# Patient Record
Sex: Female | Born: 1945 | Race: White | Hispanic: No | Marital: Married | Smoking: Never smoker
Health system: Southern US, Community
[De-identification: ages and names within clinical notes are randomized; demographics above are authoritative.]

## PROBLEM LIST (undated history)

## (undated) DIAGNOSIS — F329 Major depressive disorder, single episode, unspecified: Secondary | ICD-10-CM

## (undated) DIAGNOSIS — G459 Transient cerebral ischemic attack, unspecified: Secondary | ICD-10-CM

## (undated) DIAGNOSIS — G479 Sleep disorder, unspecified: Secondary | ICD-10-CM

## (undated) DIAGNOSIS — I639 Cerebral infarction, unspecified: Secondary | ICD-10-CM

## (undated) DIAGNOSIS — N200 Calculus of kidney: Secondary | ICD-10-CM

## (undated) DIAGNOSIS — E785 Hyperlipidemia, unspecified: Secondary | ICD-10-CM

## (undated) DIAGNOSIS — R413 Other amnesia: Secondary | ICD-10-CM

## (undated) DIAGNOSIS — R519 Headache, unspecified: Secondary | ICD-10-CM

## (undated) DIAGNOSIS — I10 Essential (primary) hypertension: Secondary | ICD-10-CM

## (undated) DIAGNOSIS — Z87442 Personal history of urinary calculi: Secondary | ICD-10-CM

## (undated) DIAGNOSIS — F32A Depression, unspecified: Secondary | ICD-10-CM

## (undated) DIAGNOSIS — M1990 Unspecified osteoarthritis, unspecified site: Secondary | ICD-10-CM

## (undated) DIAGNOSIS — F419 Anxiety disorder, unspecified: Secondary | ICD-10-CM

## (undated) HISTORY — PX: BREAST BIOPSY: SHX20

## (undated) HISTORY — PX: OTHER SURGICAL HISTORY: SHX169

## (undated) HISTORY — PX: PARTIAL HYSTERECTOMY: SHX80

## (undated) HISTORY — PX: DILATION AND CURETTAGE OF UTERUS: SHX78

## (undated) HISTORY — PX: ABDOMINAL HYSTERECTOMY: SHX81

## (undated) HISTORY — DX: Hyperlipidemia, unspecified: E78.5

## (undated) HISTORY — DX: Essential (primary) hypertension: I10

## (undated) HISTORY — DX: Major depressive disorder, single episode, unspecified: F32.9

## (undated) HISTORY — PX: TONSILLECTOMY: SUR1361

## (undated) HISTORY — DX: Depression, unspecified: F32.A

## (undated) HISTORY — DX: Anxiety disorder, unspecified: F41.9

---

## 1994-04-18 HISTORY — PX: PARTIAL COLECTOMY: SHX5273

## 2004-04-18 LAB — HM PAP SMEAR

## 2005-02-14 ENCOUNTER — Encounter: Payer: Self-pay | Admitting: Family Medicine

## 2006-04-13 LAB — CONVERTED CEMR LAB
Hgb A1c MFr Bld: 5.4 %
Microalb, Ur: NEGATIVE mg/dL

## 2006-06-27 ENCOUNTER — Emergency Department: Payer: Self-pay | Admitting: Emergency Medicine

## 2006-08-01 LAB — CONVERTED CEMR LAB
AST: 22 units/L
HDL: 58 mg/dL
LDL Cholesterol: 140 mg/dL

## 2006-12-21 ENCOUNTER — Ambulatory Visit: Payer: Self-pay | Admitting: Family Medicine

## 2006-12-21 DIAGNOSIS — E119 Type 2 diabetes mellitus without complications: Secondary | ICD-10-CM

## 2006-12-21 DIAGNOSIS — E785 Hyperlipidemia, unspecified: Secondary | ICD-10-CM

## 2006-12-21 DIAGNOSIS — F321 Major depressive disorder, single episode, moderate: Secondary | ICD-10-CM

## 2006-12-21 DIAGNOSIS — I10 Essential (primary) hypertension: Secondary | ICD-10-CM

## 2006-12-21 DIAGNOSIS — F411 Generalized anxiety disorder: Secondary | ICD-10-CM

## 2006-12-21 DIAGNOSIS — G43009 Migraine without aura, not intractable, without status migrainosus: Secondary | ICD-10-CM | POA: Insufficient documentation

## 2006-12-21 DIAGNOSIS — K279 Peptic ulcer, site unspecified, unspecified as acute or chronic, without hemorrhage or perforation: Secondary | ICD-10-CM | POA: Insufficient documentation

## 2006-12-29 ENCOUNTER — Ambulatory Visit: Payer: Self-pay | Admitting: Family Medicine

## 2007-01-01 LAB — CONVERTED CEMR LAB
AST: 22 units/L (ref 0–37)
Albumin: 4.3 g/dL (ref 3.5–5.2)
Bilirubin, Direct: 0.1 mg/dL (ref 0.0–0.3)
Calcium: 9.4 mg/dL (ref 8.4–10.5)
Chloride: 107 meq/L (ref 96–112)
Cholesterol: 186 mg/dL (ref 0–200)
GFR calc Af Amer: 131 mL/min
GFR calc non Af Amer: 108 mL/min
Glucose, Bld: 103 mg/dL — ABNORMAL HIGH (ref 70–99)
Hgb A1c MFr Bld: 5.7 % (ref 4.6–6.0)
LDL Cholesterol: 111 mg/dL — ABNORMAL HIGH (ref 0–99)
Total Bilirubin: 1 mg/dL (ref 0.3–1.2)
Total CHOL/HDL Ratio: 3.2
Total Protein: 6.5 g/dL (ref 6.0–8.3)
VLDL: 17 mg/dL (ref 0–40)

## 2007-01-22 ENCOUNTER — Telehealth (INDEPENDENT_AMBULATORY_CARE_PROVIDER_SITE_OTHER): Payer: Self-pay | Admitting: *Deleted

## 2007-02-28 ENCOUNTER — Telehealth: Payer: Self-pay | Admitting: Family Medicine

## 2007-03-02 ENCOUNTER — Ambulatory Visit: Payer: Self-pay | Admitting: Family Medicine

## 2007-03-30 ENCOUNTER — Encounter: Payer: Self-pay | Admitting: Family Medicine

## 2007-04-03 ENCOUNTER — Ambulatory Visit: Payer: Self-pay | Admitting: Family Medicine

## 2007-04-04 LAB — CONVERTED CEMR LAB
ALT: 23 units/L (ref 0–35)
Cholesterol: 174 mg/dL (ref 0–200)
HDL: 54 mg/dL (ref >39.0)
Triglycerides: 79 mg/dL (ref 0–149)
VLDL: 16 mg/dL (ref 0–40)

## 2007-04-17 ENCOUNTER — Encounter (INDEPENDENT_AMBULATORY_CARE_PROVIDER_SITE_OTHER): Payer: Self-pay | Admitting: *Deleted

## 2007-07-10 ENCOUNTER — Ambulatory Visit: Payer: Self-pay | Admitting: Family Medicine

## 2007-07-12 ENCOUNTER — Ambulatory Visit: Payer: Self-pay | Admitting: Family Medicine

## 2007-07-12 LAB — CONVERTED CEMR LAB
ALT: 27 units/L (ref 0–35)
BUN: 22 mg/dL (ref 6–23)
Bilirubin, Direct: 0.1 mg/dL (ref 0.0–0.3)
Calcium: 9.7 mg/dL (ref 8.4–10.5)
Chloride: 103 meq/L (ref 96–112)
Cholesterol: 172 mg/dL (ref 0–200)
Creatinine,U: 62.1 mg/dL
GFR calc Af Amer: 109 mL/min
HDL: 46.8 mg/dL (ref >39.0)
Hgb A1c MFr Bld: 5.8 % (ref 4.6–6.0)
Microalb Creat Ratio: 6.4 mg/g (ref 0.0–30.0)
Triglycerides: 95 mg/dL (ref 0–149)

## 2007-10-09 ENCOUNTER — Ambulatory Visit: Payer: Self-pay | Admitting: Family Medicine

## 2007-10-16 ENCOUNTER — Ambulatory Visit: Payer: Self-pay | Admitting: Family Medicine

## 2007-10-16 LAB — CONVERTED CEMR LAB: Hgb A1c MFr Bld: 5.7 % (ref 4.6–6.0)

## 2008-01-10 ENCOUNTER — Ambulatory Visit: Payer: Self-pay | Admitting: Family Medicine

## 2008-01-11 LAB — CONVERTED CEMR LAB
ALT: 23 units/L (ref 0–35)
AST: 22 units/L (ref 0–37)
Albumin: 4.5 g/dL (ref 3.5–5.2)
Chloride: 104 meq/L (ref 96–112)
Cholesterol: 183 mg/dL (ref 0–200)
GFR calc Af Amer: 130 mL/min
Glucose, Bld: 106 mg/dL — ABNORMAL HIGH (ref 70–99)
Hgb A1c MFr Bld: 5.7 % (ref 4.6–6.0)
LDL Cholesterol: 109 mg/dL — ABNORMAL HIGH (ref 0–99)
Potassium: 4.3 meq/L (ref 3.5–5.1)
Sodium: 141 meq/L (ref 135–145)
Total Protein: 6.8 g/dL (ref 6.0–8.3)

## 2008-01-14 ENCOUNTER — Ambulatory Visit: Payer: Self-pay | Admitting: Family Medicine

## 2008-01-14 DIAGNOSIS — M19049 Primary osteoarthritis, unspecified hand: Secondary | ICD-10-CM | POA: Insufficient documentation

## 2008-01-14 LAB — CONVERTED CEMR LAB: Cholesterol, target level: 200 mg/dL

## 2008-02-05 ENCOUNTER — Emergency Department (HOSPITAL_COMMUNITY): Admission: EM | Admit: 2008-02-05 | Discharge: 2008-02-05 | Payer: Self-pay | Admitting: Emergency Medicine

## 2008-02-14 ENCOUNTER — Ambulatory Visit: Payer: Self-pay | Admitting: Family Medicine

## 2008-02-28 ENCOUNTER — Telehealth: Payer: Self-pay | Admitting: Family Medicine

## 2008-04-02 ENCOUNTER — Encounter: Payer: Self-pay | Admitting: Family Medicine

## 2008-04-14 ENCOUNTER — Ambulatory Visit: Payer: Self-pay | Admitting: Family Medicine

## 2008-04-15 ENCOUNTER — Ambulatory Visit: Payer: Self-pay | Admitting: Family Medicine

## 2008-04-15 LAB — CONVERTED CEMR LAB
Albumin: 4.1 g/dL (ref 3.5–5.2)
Alkaline Phosphatase: 75 units/L (ref 39–117)
Bilirubin, Direct: 0.1 mg/dL (ref 0.0–0.3)
Creatinine, Ser: 0.6 mg/dL (ref 0.4–1.2)
GFR calc non Af Amer: 108 mL/min
Glucose, Bld: 111 mg/dL — ABNORMAL HIGH (ref 70–99)
HDL: 57.5 mg/dL (ref >39.0)
Hgb A1c MFr Bld: 5.6 % (ref 4.6–6.0)
Potassium: 4.4 meq/L (ref 3.5–5.1)
Total CHOL/HDL Ratio: 2.9

## 2008-08-12 ENCOUNTER — Telehealth: Payer: Self-pay | Admitting: Family Medicine

## 2008-08-14 ENCOUNTER — Telehealth: Payer: Self-pay | Admitting: Family Medicine

## 2008-10-01 ENCOUNTER — Ambulatory Visit: Payer: Self-pay | Admitting: Family Medicine

## 2008-10-01 LAB — CONVERTED CEMR LAB
AST: 20 units/L (ref 0–37)
Albumin: 4.3 g/dL (ref 3.5–5.2)
Alkaline Phosphatase: 87 units/L (ref 39–117)
BUN: 18 mg/dL (ref 6–23)
CO2: 29 meq/L (ref 19–32)
Chloride: 101 meq/L (ref 96–112)
Cholesterol: 170 mg/dL (ref 0–200)
GFR calc non Af Amer: 107.23 mL/min (ref >60)
Glucose, Bld: 103 mg/dL — ABNORMAL HIGH (ref 70–99)
HDL: 55.4 mg/dL (ref >39.00)
Hgb A1c MFr Bld: 5.6 % (ref 4.6–6.5)
LDL Cholesterol: 99 mg/dL (ref 0–99)
Potassium: 4.3 meq/L (ref 3.5–5.1)
Sodium: 143 meq/L (ref 135–145)
VLDL: 15.8 mg/dL (ref 0.0–40.0)

## 2008-10-03 ENCOUNTER — Ambulatory Visit: Payer: Self-pay | Admitting: Family Medicine

## 2008-10-16 ENCOUNTER — Telehealth: Payer: Self-pay | Admitting: Family Medicine

## 2009-02-10 ENCOUNTER — Telehealth: Payer: Self-pay | Admitting: Family Medicine

## 2009-02-26 ENCOUNTER — Ambulatory Visit: Payer: Self-pay | Admitting: Family Medicine

## 2009-04-01 ENCOUNTER — Ambulatory Visit: Payer: Self-pay | Admitting: Family Medicine

## 2009-04-02 LAB — CONVERTED CEMR LAB
ALT: 19 units/L (ref 0–35)
Albumin: 4.3 g/dL (ref 3.5–5.2)
Alkaline Phosphatase: 77 units/L (ref 39–117)
BUN: 16 mg/dL (ref 6–23)
CO2: 29 meq/L (ref 19–32)
Calcium: 9.5 mg/dL (ref 8.4–10.5)
Cholesterol: 157 mg/dL (ref 0–200)
Creatinine, Ser: 0.6 mg/dL (ref 0.4–1.2)
GFR calc non Af Amer: 107.05 mL/min (ref >60)
LDL Cholesterol: 89 mg/dL (ref 0–99)
Microalb, Ur: 0.3 mg/dL (ref 0.0–1.9)
Potassium: 4 meq/L (ref 3.5–5.1)
Sodium: 139 meq/L (ref 135–145)
Total Bilirubin: 0.9 mg/dL (ref 0.3–1.2)

## 2009-04-22 ENCOUNTER — Ambulatory Visit: Payer: Self-pay | Admitting: Family Medicine

## 2009-05-01 ENCOUNTER — Ambulatory Visit: Payer: Self-pay | Admitting: Family Medicine

## 2009-05-04 LAB — CONVERTED CEMR LAB
Creatinine, Ser: 0.64 mg/dL (ref 0.40–1.20)
Glucose, Bld: 100 mg/dL — ABNORMAL HIGH (ref 70–99)

## 2009-05-29 ENCOUNTER — Ambulatory Visit: Payer: Self-pay | Admitting: Family Medicine

## 2009-08-24 ENCOUNTER — Telehealth: Payer: Self-pay | Admitting: Family Medicine

## 2009-08-29 ENCOUNTER — Encounter: Payer: Self-pay | Admitting: Family Medicine

## 2009-08-29 LAB — HM DIABETES EYE EXAM: HM Diabetic Eye Exam: NORMAL

## 2009-09-01 ENCOUNTER — Encounter: Payer: Self-pay | Admitting: Family Medicine

## 2009-09-08 ENCOUNTER — Encounter: Payer: Self-pay | Admitting: Family Medicine

## 2009-09-23 ENCOUNTER — Ambulatory Visit: Payer: Self-pay | Admitting: Family Medicine

## 2009-09-25 ENCOUNTER — Encounter: Payer: Self-pay | Admitting: Family Medicine

## 2009-09-25 LAB — CONVERTED CEMR LAB
Albumin: 4.4 g/dL (ref 3.5–5.2)
Alkaline Phosphatase: 78 units/L (ref 39–117)
Bilirubin, Direct: 0.1 mg/dL (ref 0.0–0.3)
Cholesterol: 157 mg/dL (ref 0–200)
GFR calc non Af Amer: 102.92 mL/min (ref >60)
Hgb A1c MFr Bld: 5.7 % (ref 4.6–6.5)
LDL Cholesterol: 88 mg/dL (ref 0–99)
Microalb Creat Ratio: 0.9 mg/g (ref 0.0–30.0)
Microalb, Ur: 0.6 mg/dL (ref 0.0–1.9)
Sodium: 141 meq/L (ref 135–145)
Total CHOL/HDL Ratio: 3
Total Protein: 6.5 g/dL (ref 6.0–8.3)
VLDL: 19.4 mg/dL (ref 0.0–40.0)

## 2009-10-02 ENCOUNTER — Ambulatory Visit: Payer: Self-pay | Admitting: Family Medicine

## 2010-01-21 ENCOUNTER — Ambulatory Visit: Payer: Self-pay | Admitting: Family Medicine

## 2010-03-06 LAB — HM MAMMOGRAPHY: HM Mammogram: ABNORMAL

## 2010-03-15 ENCOUNTER — Encounter: Payer: Self-pay | Admitting: Family Medicine

## 2010-03-24 ENCOUNTER — Ambulatory Visit: Payer: Self-pay | Admitting: Family Medicine

## 2010-03-25 LAB — CONVERTED CEMR LAB
ALT: 18 units/L (ref 0–35)
AST: 20 units/L (ref 0–37)

## 2010-04-02 ENCOUNTER — Ambulatory Visit: Payer: Self-pay | Admitting: Family Medicine

## 2010-04-02 LAB — HM DIABETES FOOT EXAM

## 2010-05-04 ENCOUNTER — Telehealth: Payer: Self-pay | Admitting: Family Medicine

## 2010-05-04 ENCOUNTER — Ambulatory Visit
Admission: RE | Admit: 2010-05-04 | Discharge: 2010-05-04 | Payer: Self-pay | Source: Home / Self Care | Attending: Family Medicine | Admitting: Family Medicine

## 2010-05-18 NOTE — Assessment & Plan Note (Signed)
Summary: 1 M F/U MOOD/DLO   Vital Signs:  Patient profile:   65 year old female Height:      61.5 inches Weight:      187.2 pounds BMI:     34.92 Temp:     98.9 degrees F oral Pulse rate:   80 / minute Pulse rhythm:   regular BP sitting:   110 / 70  (left arm) Cuff size:   regular  Vitals Entered By: Benny Lennert CMA Duncan Dull) (May 29, 2009 8:46 AM)  History of Present Illness: HTN, improved on higher dose of lisinopril. Cr and k stable.  Depression, improved control on higher dose on sertraline. No known SE.   Acute Visit History:      The patient complains of cough, earache, fever, headache, nasal discharge, and sore throat.  These symptoms began 2 days ago.  She denies abdominal pain, diarrhea, and sinus problems.  Other comments include: body aches, fatigue daughter with the flu, grandchildren.        The character of the cough is described as nonproductive.  There is no history of wheezing, sleep interference, or shortness of breath associated with her cough.        Earache symptom: left, intermittant sharp.        Problems Prior to Update: 1)  Osteoarthrosis Unspec Whether Gen/localized Hand  (ONG-295.28) 2)  Unspecified Breast Screening  (ICD-V76.10) 3)  Common Migraine  (ICD-346.10) 4)  Shoulder Pain, Left  (ICD-719.41) 5)  Anxiety  (ICD-300.00) 6)  Depression  (ICD-311) 7)  Pud  (ICD-533.90) 8)  Hypertension  (ICD-401.9) 9)  Hyperlipidemia  (ICD-272.4) 10)  Diabetes Mellitus, Type II  (ICD-250.00)  Allergies (verified): No Known Drug Allergies  Past History:  Past medical, surgical, family and social histories (including risk factors) reviewed, and no changes noted (except as noted below).  Past Medical History: Reviewed history from 12/21/2006 and no changes required. Diabetes mellitus, type II Hyperlipidemia Hypertension Depression Anxiety  Past Surgical History: Reviewed history from 12/21/2006 and no changes required. 1996 partial colectomy  for ruptured abcess for diverticulitis right parotid gland removed Hysterectomy, partial for uterine prolapse breast biopsy  Family History: Reviewed history from 12/21/2006 and no changes required. mother Alzheimer's father no contact 1 brother no contact MGF: DM, CAD  Social History: Reviewed history from 12/21/2006 and no changes required. 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 CV:  Denies chest pain or discomfort.  Physical Exam  General:  fatigued appearing  in NAD  Head:  no maxillar sinus ttp Ears:  clear lfuid B TMs Nose:  nbasal discharge Mouth:  MMM Neck:  no carotid bruit or thyromegaly no cervical or supraclavicular lymphadenopathy  Lungs:  Normal respiratory effort, chest expands symmetrically. Lungs are clear to auscultation, no crackles or wheezes. Heart:  Normal rate and regular rhythm. S1 and S2 normal without gallop, murmur, click, rub or other extra sounds. Pulses:  R and L posterior tibial pulses are full and equal bilaterally  Extremities:  no edema Skin:  Intact without suspicious lesions or rashes   Impression & Recommendations:  Problem # 1:  DEPRESSION (ICD-311) Improved on current increase in med.  Her updated medication list for this problem includes:    Sertraline Hcl 100 Mg Tabs (Sertraline hcl) .Marland Kitchen... Take 1 tablet by mouth once a day  Problem # 2:  HYPERTENSION (ICD-401.9) improved and no SE on higher  dose lisinopril.  Her updated medication list for this problem includes:    Lisinopril 5 Mg Tabs (Lisinopril) .Marland Kitchen... Take 1 tablet by mouth once a day    Hydrochlorothiazide 25 Mg Tabs (Hydrochlorothiazide) .Marland Kitchen... 1 by mouth once daily  Problem # 3:  INFLUENZA (ICD-487.8) Discussed symptomatic care.  Hydration, rest. Call if SOB, cough worsening or prolongued fever. Reviewed flu timeline. Shefor use of tamiflu,  given age and med history. She was advised to not return to work until 24 hour after fever resolved on no antipyretics.    Complete Medication List: 1)  Vytorin 10-80 Mg Tabs (Ezetimibe-simvastatin) .Marland Kitchen.. 1 tab by mouth daily 2)  Lisinopril 5 Mg Tabs (Lisinopril) .... Take 1 tablet by mouth once a day 3)  Nexium 40 Mg Cpdr (Esomeprazole magnesium) .Marland Kitchen.. 1 by mouth once daily 4)  Sertraline Hcl 100 Mg Tabs (Sertraline hcl) .... Take 1 tablet by mouth once a day 5)  Hydrochlorothiazide 25 Mg Tabs (Hydrochlorothiazide) .Marland Kitchen.. 1 by mouth once daily 6)  Centrum Silver Tabs (Multiple vitamins-minerals) .Marland Kitchen.. 1 by mouth once daily 7)  Citracal + D 250-200 Mg-unit Tabs (Calcium citrate-vitamin d) .Marland Kitchen.. 1 by mouth once daily 8)  Bayer Low Strength 81 Mg Tbec (Aspirin) .Marland Kitchen.. 1 by mouth once daily 9)  Fish Oil Oil (Fish oil) .... Take 1 capsule by mouth once a day 10)  Bayer Breeze 2 Glucose Monitor Disc  .... Check blood sugar 1-2 x daily dx 250.00 11)  Meloxicam 7.5 Mg Tabs (Meloxicam) .Marland Kitchen.. 1-2 tab by mouth daily as needed pain 12)  Glucosamine-chondroitin 500-400 Mg Caps (Glucosamine-chondroitin) .... Take 1 tablet by mouth three times a day 13)  Tamiflu 75 Mg Caps (Oseltamivir phosphate) .Marland Kitchen.. 1 tab by mouth two times a day x 5 day 14)  Cheratussin Ac 100-10 Mg/51ml Syrp (Guaifenesin-codeine) .Marland Kitchen.. 1-2 tsp by mouth at bedtime as needed cough Prescriptions: CHERATUSSIN AC 100-10 MG/5ML SYRP (GUAIFENESIN-CODEINE) 1-2 tsp by mouth at bedtime as needed cough  #8 oz x 0   Entered and Authorized by:   Kerby Nora MD   Signed by:   Kerby Nora MD on 05/29/2009   Method used:   Print then Give to Patient   RxID:   [id number] TAMIFLU 75 MG CAPS (OSELTAMIVIR PHOSPHATE) 1 tab by mouth two times a day x 5 day  #10 x 0   Entered and Authorized by:   Kerby Nora MD   Signed by:   Kerby Nora MD on 05/29/2009   Method used:   Electronically to        [facility] (retail)       [address]       [place], [place]  [address]       Ph: [phone] or [phone]       Fax: [phone]   RxID:   [id number]   Current Allergies (reviewed today): No known allergies

## 2010-05-18 NOTE — Assessment & Plan Note (Signed)
Summary: CPX/DLO   Vital Signs:  Patient profile:   65 year old female Height:      61.5 inches Weight:      183.0 pounds BMI:     34.14 Temp:     98.2 degrees F oral Pulse rate:   80 / minute Pulse rhythm:   regular BP sitting:   120 / 70  (left arm) Cuff size:   regular  Vitals Entered By: Benny Lennert CMA Duncan Dull) (October 02, 2009 11:10 AM)  History of Present Illness: Chief complaint cpx  Mammogram, abnormal .. repeated diagnostic/US remains abnormal...recommend repeat in 6 months.   Depression, moderate control: On sertraline 100 mg daily. Above breast abnormality is worrying her some. Poor sleep at night..wakes up early in AMs, no SI.    Problems Prior to Update: 1)  Influenza  (ICD-487.8) 2)  Osteoarthrosis Unspec Whether Gen/localized Hand  (ICD-715.94) 3)  Unspecified Breast Screening  (ICD-V76.10) 4)  Common Migraine  (ICD-346.10) 5)  Shoulder Pain, Left  (ICD-719.41) 6)  Anxiety  (ICD-300.00) 7)  Depression  (ICD-311) 8)  Pud  (ICD-533.90) 9)  Hypertension  (ICD-401.9) 10)  Hyperlipidemia  (ICD-272.4) 11)  Diabetes Mellitus, Type II  (ICD-250.00)  Current Medications (verified): 1)  Vytorin 10-80 Mg Tabs (Ezetimibe-Simvastatin) .Marland Kitchen.. 1 Tab By Mouth Daily 2)  Lisinopril 5 Mg Tabs (Lisinopril) .... Take 1 Tablet By Mouth Once A Day 3)  Nexium 40 Mg  Cpdr (Esomeprazole Magnesium) .Marland Kitchen.. 1 By Mouth Once Daily 4)  Sertraline Hcl 100 Mg Tabs (Sertraline Hcl) .... Take 1 Tablet By Mouth Once A Day 5)  Hydrochlorothiazide 25 Mg  Tabs (Hydrochlorothiazide) .Marland Kitchen.. 1 By Mouth Once Daily 6)  Centrum Silver   Tabs (Multiple Vitamins-Minerals) .Marland Kitchen.. 1 By Mouth Once Daily 7)  Citracal + D 250-200 Mg-Unit  Tabs (Calcium Citrate-Vitamin D) .Marland Kitchen.. 1 By Mouth Once Daily 8)  Bayer Low Strength 81 Mg  Tbec (Aspirin) .Marland Kitchen.. 1 By Mouth Once Daily 9)  Fish Oil   Oil (Fish Oil) .... Take 1 Capsule By Mouth Once A Day 10)  Bayer Breeze 2 Glucose Monitor Disc .... Check Blood Sugar 1-2 X Daily  Dx 250.00 11)  Meloxicam 7.5 Mg Tabs (Meloxicam) .Marland Kitchen.. 1-2 Tab By Mouth Daily As Needed Pain 12)  Glucosamine-Chondroitin 500-400 Mg Caps (Glucosamine-Chondroitin) .... Take 1 Tablet By Mouth Three Times A Day  Allergies (verified): No Known Drug Allergies  Past History:  Past medical, surgical, family and social histories (including risk factors) reviewed, and no changes noted (except as noted below).  Past Medical History: Reviewed history from 12/21/2006 and no changes required. Diabetes mellitus, type II Hyperlipidemia Hypertension Depression Anxiety  Past Surgical History: Reviewed history from 12/21/2006 and no changes required. 1996 partial colectomy for ruptured abcess for diverticulitis right parotid gland removed Hysterectomy, partial for uterine prolapse breast biopsy  Family History: Reviewed history from 12/21/2006 and no changes required. mother Alzheimer's father no contact 1 brother no contact MGF: DM, CAD  Social History: Reviewed history from 12/21/2006 and no changes required. 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 CV:  Denies chest pain or discomfort. Resp:  Denies shortness of breath. GI:  Denies abdominal pain, bloody stools, constipation, and diarrhea. GU:  Denies dysuria. Derm:  Denies rash.  Physical Exam  General:  fatigued appearing  in NAD  Eyes:  No corneal or conjunctival inflammation noted. EOMI. Perrla. Funduscopic  exam benign, without hemorrhages, exudates or papilledema. Vision grossly normal. Ears:  External ear exam shows no significant lesions or deformities.  Otoscopic examination reveals clear canals, tympanic membranes are intact bilaterally without bulging, retraction, inflammation or discharge. Hearing is grossly normal bilaterally. Nose:  External nasal examination shows no deformity or  inflammation. Nasal mucosa are pink and moist without lesions or exudates. Mouth:  Oral mucosa and oropharynx without lesions or exudates.  Teeth in good repair. Neck:  no carotid bruit or thyromegaly no cervical or supraclavicular lymphadenopathy  Chest Wall:  No deformities, masses, or tenderness noted. Breasts:  No mass, nodules, thickening, tenderness, bulging, retraction, inflamation, nipple discharge or skin changes noted.   Lungs:  Normal respiratory effort, chest expands symmetrically. Lungs are clear to auscultation, no crackles or wheezes. Heart:  Normal rate and regular rhythm. S1 and S2 normal without gallop, murmur, click, rub or other extra sounds. Abdomen:  Bowel sounds positive,abdomen soft and non-tender without masses, organomegaly or hernias noted. Genitalia:  not indicated Msk:  No deformity or scoliosis noted of thoracic or lumbar spine.   Pulses:  R and L posterior tibial pulses are full and equal bilaterally  Extremities:  no edema  Neurologic:  No cranial nerve deficits noted. Station and gait are normal. Plantar reflexes are down-going bilaterally. DTRs are symmetrical throughout. Sensory, motor and coordinative functions appear intact. Skin:  Intact without suspicious lesions or rashes Psych:  Cognition and judgment appear intact. Alert and cooperative with normal attention span and concentration. No apparent delusions, illusions, hallucinations   Impression & Recommendations:  Problem # 1:  Preventive Health Care (ICD-V70.0) The patient's preventative maintenance and recommended screening tests for an annual wellness exam were reviewed in full today. Brought up to date unless services declined.  Counselled on the importance of diet, exercise, and its role in overall health and mortality. The patient's FH and SH was reviewed, including their home life, tobacco status, and drug and alcohol status.     Problem # 2:  DEPRESSION (ICD-311) Moderately controlled.  Continue current medication.  Her updated medication list for this problem includes:    Sertraline Hcl 100 Mg Tabs (Sertraline hcl) .Marland Kitchen... Take 1 tablet by mouth once a day  Problem # 3:  HYPERTENSION (ICD-401.9)  Well controlled. Continue current medication.  Her updated medication list for this problem includes:    Lisinopril 5 Mg Tabs (Lisinopril) .Marland Kitchen... Take 1 tablet by mouth once a day    Hydrochlorothiazide 25 Mg Tabs (Hydrochlorothiazide) .Marland Kitchen... 1 by mouth once daily  BP today: 120/70 Prior BP: 110/70 (05/29/2009)  Prior 10 Yr Risk Heart Disease: 20 % (04/22/2009)  Labs Reviewed: K+: 4.5 (09/23/2009) Creat: : 0.6 (09/23/2009)   Chol: 157 (09/23/2009)   HDL: 50.10 (09/23/2009)   LDL: 88 (09/23/2009)   TG: 97.0 (09/23/2009)  Problem # 4:  HYPERLIPIDEMIA (ICD-272.4)  Very stable control...space checking out to every 12 months. AST, ALT in 6 months.  Her updated medication list for this problem includes:    Vytorin 10-80 Mg Tabs (Ezetimibe-simvastatin) .Marland Kitchen... 1 tab by mouth daily  Labs Reviewed: SGOT: 24 (09/23/2009)   SGPT: 23 (09/23/2009)  Lipid Goals: Chol Goal: 200 (01/14/2008)   HDL Goal: 40 (01/14/2008)   LDL Goal: 100 (01/14/2008)   TG Goal: 150 (01/14/2008)  Prior 10 Yr Risk Heart Disease: 20 % (04/22/2009)   HDL:50.10 (09/23/2009), 49.60 (04/01/2009)  LDL:88 (09/23/2009), 89 (04/01/2009)  Chol:157 (09/23/2009), 157 (04/01/2009)  Trig:97.0 (09/23/2009), 93.0 (04/01/2009)  Problem # 5:  DIABETES  MELLITUS, TYPE II (ICD-250.00)  Well controlled with lifestyle. Encouraged exercise, weight loss, healthy eating habits.  Rechek A1C in 6 months.  Her updated medication list for this problem includes:    Lisinopril 5 Mg Tabs (Lisinopril) .Marland Kitchen... Take 1 tablet by mouth once a day    Bayer Low Strength 81 Mg Tbec (Aspirin) .Marland Kitchen... 1 by mouth once daily  Labs Reviewed: Creat: 0.6 (09/23/2009)     Last Eye Exam: normal (08/29/2009) Reviewed HgBA1c results: 5.7 (09/23/2009)  5.7  (04/01/2009)  Complete Medication List: 1)  Vytorin 10-80 Mg Tabs (Ezetimibe-simvastatin) .Marland Kitchen.. 1 tab by mouth daily 2)  Lisinopril 5 Mg Tabs (Lisinopril) .... Take 1 tablet by mouth once a day 3)  Nexium 40 Mg Cpdr (Esomeprazole magnesium) .Marland Kitchen.. 1 by mouth once daily 4)  Sertraline Hcl 100 Mg Tabs (Sertraline hcl) .... Take 1 tablet by mouth once a day 5)  Hydrochlorothiazide 25 Mg Tabs (Hydrochlorothiazide) .Marland Kitchen.. 1 by mouth once daily 6)  Centrum Silver Tabs (Multiple vitamins-minerals) .Marland Kitchen.. 1 by mouth once daily 7)  Citracal + D 250-200 Mg-unit Tabs (Calcium citrate-vitamin d) .Marland Kitchen.. 1 by mouth once daily 8)  Bayer Low Strength 81 Mg Tbec (Aspirin) .Marland Kitchen.. 1 by mouth once daily 9)  Fish Oil Oil (Fish oil) .... Take 1 capsule by mouth once a day 10)  Bayer Breeze 2 Glucose Monitor Disc  .... Check blood sugar 1-2 x daily dx 250.00 11)  Meloxicam 7.5 Mg Tabs (Meloxicam) .Marland Kitchen.. 1-2 tab by mouth daily as needed pain 12)  Glucosamine-chondroitin 500-400 Mg Caps (Glucosamine-chondroitin) .... Take 1 tablet by mouth three times a day  Patient Instructions: 1)  Please schedule a follow-up appointment in 6 months  DM, HTN 2)  HgBA1c prior to visit  ICD-9: 250.00    Current Allergies (reviewed today): No known allergies    Herpes Zoster Next Due:  Refused Flex Sig Next Due:  Not Indicated Colonoscopy Result Date:  04/19/1991 Colonoscopy Next Due:  Refused Hemoccult Next Due:  Refused Last PAP:  Normal (07/17/2005 8:08:07 AM) PAP Next Due:  3 yr Last Mammogram:  normal (03/30/2007 10:57:53 PM) Mammogram Result Date:  09/08/2009 Mammogram Result:  abnormal, left  Mammogram Next Due:  6 mo Waiting on colonoscopy until after gets medicare.

## 2010-05-18 NOTE — Letter (Signed)
Summary: [facility]   Imported By: Lester Spry 10/03/2009 10:16:55  _____________________________________________________________________  External Attachment:    Type:   Image     Comment:   External Document

## 2010-05-18 NOTE — Letter (Signed)
Summary: [facility]   Imported By: Lanelle Bal 09/03/2009 11:51:42  _____________________________________________________________________  External Attachment:    Type:   Image     Comment:   External Document  Appended Document: Orders Update    Clinical Lists Changes  Observations: Added new observation of EYES COMMENT: 08/2010 (09/04/2009 17:54) Added new observation of DMEYEEXMRES: normal (08/29/2009 17:54) Added new observation of DIAB EYE EX: normal (08/29/2009 17:54)       Diabetes Management History:      She is checking home blood sugars.  She says that she is exercising.  Type of exercise includes: walking.  Duration of exercise is estimated to be 30 min.  She is doing this 4 times per week.    Diabetes Management Exam:    Eye Exam:       Eye Exam done elsewhere          Date: 08/29/2009          Results: normal          Done by: eye MD  Diabetes Management Assessment/Plan:      The following lipid goals have been established for the patient: Total cholesterol goal of 200; LDL cholesterol goal of 100; HDL cholesterol goal of 40; Triglyceride goal of 150.  Her blood pressure goal is < 130/80.

## 2010-05-18 NOTE — Assessment & Plan Note (Signed)
Summary: FLU SHOT/CLE  Nurse Visit   Allergies: No Known Drug Allergies  Orders Added: 1)  Admin 1st Vaccine [90471] 2)  Flu Vaccine 3yrs + [90658]  Flu Vaccine Consent Questions     Do you have a history of severe allergic reactions to this vaccine? no    Any prior history of allergic reactions to egg and/or gelatin? no    Do you have a sensitivity to the preservative Thimersol? no    Do you have a past history of Guillan-Barre Syndrome? no    Do you currently have an acute febrile illness? no    Have you ever had a severe reaction to latex? no    Vaccine information given and explained to patient? yes    Are you currently pregnant? no    Lot Number:[id number]   Exp Date:10/16/2010   Site Given  Left Deltoid IMu  

## 2010-05-18 NOTE — Assessment & Plan Note (Signed)
Summary: 6 month follow up/hmw   Vital Signs:  Patient profile:   65 year old female Height:      61.5 inches Weight:      188.6 pounds BMI:     35.19 Temp:     97.8 degrees F oral Pulse rate:   80 / minute Pulse rhythm:   regular BP sitting:   130 / 90  (left arm) Cuff size:   regular  Vitals Entered By: Benny Lennert CMA Duncan Dull) (April 22, 2009 9:58 AM)  History of Present Illness: Chief complaint 6 month follow up  Depression, moderate control...recent stressors of family med illnesses.  Some insomnia.  No SI.  She states she has "no time" for a counselor.   HTN, inadequate control...BPs trending up at home 140/93 ...awaking with AM headaches. No snoring at night.  Not interested in sleep study at this time.   Hypertension History:      She denies chest pain, orthopnea, peripheral edema, and syncope.  She notes no problems with any antihypertensive medication side effects.        Positive major cardiovascular risk factors include female age 55 years old or older, diabetes, hyperlipidemia, and hypertension.  Negative major cardiovascular risk factors include non-tobacco-user status.    Lipid Management History:      Positive NCEP/ATP III risk factors include female age 32 years old or older, diabetes, and hypertension.  Negative NCEP/ATP III risk factors include non-tobacco-user status.        The patient states that she knows about the "Therapeutic Lifestyle Change" diet.  Her compliance with the TLC diet is excellent.  Adjunctive measures started by the patient include aerobic exercise, fiber, limit alcohol consumpton, and weight reduction.  She expresses no side effects from her lipid-lowering medication.  The patient denies any symptoms to suggest myopathy or liver disease.  Comments: Not exercsiing because too busy.    Problems Prior to Update: 1)  Osteoarthrosis Unspec Whether Gen/localized Hand  (EAV-409.81) 2)  Unspecified Breast Screening  (ICD-V76.10) 3)  Common  Migraine  (ICD-346.10) 4)  Shoulder Pain, Left  (ICD-719.41) 5)  Anxiety  (ICD-300.00) 6)  Depression  (ICD-311) 7)  Pud  (ICD-533.90) 8)  Hypertension  (ICD-401.9) 9)  Hyperlipidemia  (ICD-272.4) 10)  Diabetes Mellitus, Type II  (ICD-250.00)  Current Medications (verified): 1)  Vytorin 10-80 Mg Tabs (Ezetimibe-Simvastatin) .Marland Kitchen.. 1 Tab By Mouth Daily 2)  Lisinopril 2.5 Mg  Tabs (Lisinopril) .Marland Kitchen.. 1 By Mouth Once Daily 3)  Nexium 40 Mg  Cpdr (Esomeprazole Magnesium) .Marland Kitchen.. 1 By Mouth Once Daily 4)  Sertraline Hcl 100 Mg Tabs (Sertraline Hcl) .... Take 1 Tablet By Mouth Once A Day 5)  Hydrochlorothiazide 25 Mg  Tabs (Hydrochlorothiazide) .Marland Kitchen.. 1 By Mouth Once Daily 6)  Centrum Silver   Tabs (Multiple Vitamins-Minerals) .Marland Kitchen.. 1 By Mouth Once Daily 7)  Citracal + D 250-200 Mg-Unit  Tabs (Calcium Citrate-Vitamin D) .Marland Kitchen.. 1 By Mouth Once Daily 8)  Bayer Low Strength 81 Mg  Tbec (Aspirin) .Marland Kitchen.. 1 By Mouth Once Daily 9)  Fish Oil   Oil (Fish Oil) .... Take 1 Capsule By Mouth Once A Day 10)  Bayer Breeze 2 Glucose Monitor Disc .... Check Blood Sugar 1-2 X Daily Dx 250.00 11)  Meloxicam 7.5 Mg Tabs (Meloxicam) .Marland Kitchen.. 1-2 Tab By Mouth Daily As Needed Pain 12)  Glucosamine-Chondroitin 500-400 Mg Caps (Glucosamine-Chondroitin) .... Take 1 Tablet By Mouth Three Times A Day  Allergies (verified): No Known Drug Allergies  Past History:  Past medical, surgical, family and social histories (including risk factors) reviewed, and no changes noted (except as noted below).  Past Medical History: Reviewed history from 12/21/2006 and no changes required. Diabetes mellitus, type II Hyperlipidemia Hypertension Depression Anxiety  Past Surgical History: Reviewed history from 12/21/2006 and no changes required. 1996 partial colectomy for ruptured abcess for diverticulitis right parotid gland removed Hysterectomy, partial for uterine prolapse breast biopsy  Family History: Reviewed history from 12/21/2006 and  no changes required. mother Alzheimer's father no contact 1 brother no contact MGF: DM, CAD  Social History: Reviewed history from 12/21/2006 and no changes required. Married Occupation: Daycare Never Smoked Alcohol use-no Drug use-no Regular exercise-no Diet: poor diet, fast food  Physical Exam  General:  obese appearing female in NAd Mouth:  MMM Neck:  no carotid bruit or thyromegaly  Lungs:  Normal respiratory effort, chest expands symmetrically. Lungs are clear to auscultation, no crackles or wheezes. Heart:  Normal rate and regular rhythm. S1 and S2 normal without gallop, murmur, click, rub or other extra sounds. Abdomen:  Bowel sounds positive,abdomen soft and non-tender without masses, organomegaly or hernias noted. Pulses:  R and L posterior tibial pulses are full and equal bilaterally  Extremities:  no edema Psych:  Oriented X3, subdued, withdrawn, and poor eye contact.    Diabetes Management Exam:    Foot Exam (with socks and/or shoes not present):       Sensory-Pinprick/Light touch:          Left medial foot (L-4): normal          Left dorsal foot (L-5): normal          Left lateral foot (S-1): normal          Right medial foot (L-4): normal          Right dorsal foot (L-5): normal          Right lateral foot (S-1): normal       Sensory-Monofilament:          Left foot: normal          Right foot: normal       Inspection:          Left foot: normal          Right foot: normal       Nails:          Left foot: normal          Right foot: normal   Impression & Recommendations:  Problem # 1:  DEPRESSION (ICD-311) poor control...increase sertraline to 100 mg daily.  Encouraged counseling.Margaretmary Lombard not interested.  Her updated medication list for this problem includes:    Sertraline Hcl 100 Mg Tabs (Sertraline hcl) .Marland Kitchen... Take 1 tablet by mouth once a day  Problem # 2:  HYPERTENSION (ICD-401.9)  Inadequate control. Concern for sleep apnea, but pt refuses  sleep study at this time. Will increase lisinopril and check Cr in 7-10 days.  Her updated medication list for this problem includes:    Lisinopril 5 Mg Tabs (Lisinopril) .Marland Kitchen... Take 1 tablet by mouth once a day    Hydrochlorothiazide 25 Mg Tabs (Hydrochlorothiazide) .Marland Kitchen... 1 by mouth once daily  BP today: 130/90 Prior BP: 132/84 (10/03/2008)  10 Yr Risk Heart Disease: 20 % Prior 10 Yr Risk Heart Disease: 13 % (10/03/2008)  Labs Reviewed: K+: 4.0 (04/01/2009) Creat: : 0.6 (04/01/2009)   Chol: 157 (04/01/2009)   HDL: 49.60 (04/01/2009)   LDL: 89 (04/01/2009)  TG: 93.0 (04/01/2009)  Problem # 3:  HYPERLIPIDEMIA (ICD-272.4)  Well controlle don current meds.  Her updated medication list for this problem includes:    Vytorin 10-80 Mg Tabs (Ezetimibe-simvastatin) .Marland Kitchen... 1 tab by mouth daily  Labs Reviewed: SGOT: 21 (04/01/2009)   SGPT: 19 (04/01/2009)  Lipid Goals: Chol Goal: 200 (01/14/2008)   HDL Goal: 40 (01/14/2008)   LDL Goal: 100 (01/14/2008)   TG Goal: 150 (01/14/2008)  10 Yr Risk Heart Disease: 20 % Prior 10 Yr Risk Heart Disease: 13 % (10/03/2008)   HDL:49.60 (04/01/2009), 55.40 (10/01/2008)  LDL:89 (04/01/2009), 99 (10/01/2008)  Chol:157 (04/01/2009), 170 (10/01/2008)  Trig:93.0 (04/01/2009), 79.0 (10/01/2008)  Problem # 4:  DIABETES MELLITUS, TYPE II (ICD-250.00)  Well controlled with diet and lifestyle.  Her updated medication list for this problem includes:    Lisinopril 5 Mg Tabs (Lisinopril) .Marland Kitchen... Take 1 tablet by mouth once a day    Bayer Low Strength 81 Mg Tbec (Aspirin) .Marland Kitchen... 1 by mouth once daily  Labs Reviewed: Creat: 0.6 (04/01/2009)     Last Eye Exam: normal (03/18/2008) Reviewed HgBA1c results: 5.7 (04/01/2009)  5.6 (10/01/2008)  Complete Medication List: 1)  Vytorin 10-80 Mg Tabs (Ezetimibe-simvastatin) .Marland Kitchen.. 1 tab by mouth daily 2)  Lisinopril 5 Mg Tabs (Lisinopril) .... Take 1 tablet by mouth once a day 3)  Nexium 40 Mg Cpdr (Esomeprazole magnesium)  .Marland Kitchen.. 1 by mouth once daily 4)  Sertraline Hcl 100 Mg Tabs (Sertraline hcl) .... Take 1 tablet by mouth once a day 5)  Hydrochlorothiazide 25 Mg Tabs (Hydrochlorothiazide) .Marland Kitchen.. 1 by mouth once daily 6)  Centrum Silver Tabs (Multiple vitamins-minerals) .Marland Kitchen.. 1 by mouth once daily 7)  Citracal + D 250-200 Mg-unit Tabs (Calcium citrate-vitamin d) .Marland Kitchen.. 1 by mouth once daily 8)  Bayer Low Strength 81 Mg Tbec (Aspirin) .Marland Kitchen.. 1 by mouth once daily 9)  Fish Oil Oil (Fish oil) .... Take 1 capsule by mouth once a day 10)  Bayer Breeze 2 Glucose Monitor Disc  .... Check blood sugar 1-2 x daily dx 250.00 11)  Meloxicam 7.5 Mg Tabs (Meloxicam) .Marland Kitchen.. 1-2 tab by mouth daily as needed pain 12)  Glucosamine-chondroitin 500-400 Mg Caps (Glucosamine-chondroitin) .... Take 1 tablet by mouth three times a day  Hypertension Assessment/Plan:      The patient's hypertensive risk group is category C: Target organ damage and/or diabetes.  Her calculated 10 year risk of coronary heart disease is 20 %.  Today's blood pressure is 130/90.  Her blood pressure goal is < 130/80.  Lipid Assessment/Plan:      Based on NCEP/ATP III, the patient's risk factor category is "history of diabetes".  The patient's lipid goals are as follows: Total cholesterol goal is 200; LDL cholesterol goal is 100; HDL cholesterol goal is 40; Triglyceride goal is 150.  Her LDL cholesterol goal has been met.    Patient Instructions: 1)  Follow up in 1 month mood.  2)  BMET in 7- 10 days after increasing lisinopril. 3)  CPE in 6 months with fasting lipids, A1C, microalbumin, CMET prior Dx 250.00 Prescriptions: LISINOPRIL 5 MG TABS (LISINOPRIL) Take 1 tablet by mouth once a day  #30 x 11   Entered and Authorized by:   Kerby Nora MD   Signed by:   Kerby Nora MD on 04/22/2009   Method used:   Electronically to        [facility]  [address] [phone]* (retail)       [address]  [place], [place]  [address]       Ph: [phone] or [phone]        Fax: [phone]   RxID:   [id number] SERTRALINE HCL 100 MG TABS (SERTRALINE HCL) Take 1 tablet by mouth once a day  #30 x 3   Entered and Authorized by:   Kerby Nora MD   Signed by:   Kerby Nora MD on 04/22/2009   Method used:   [facility]        [facility]  [address] (retail)       [address]       [place], [place]  [address]       Ph: [phone] or [phone]       Fax: [phone]   RxID:   [id number]   Current Allergies (reviewed today): No known allergies

## 2010-05-18 NOTE — Progress Notes (Signed)
Summary: needs order for mammogram  Phone Note Call from Patient Call back at Home Phone [phone]   Caller: Patient Call For: Kerby Nora MD Summary of Call: Pt needs order for mammogram.  She goes to [facility].  She has appt set up for 5/17. Initial call taken by: Lowella Petties CMA,  Aug 24, 2009 2:00 PM

## 2010-05-20 NOTE — Assessment & Plan Note (Signed)
Summary: 1 MONTH FOLLOW UP MOOD/RBH   Vital Signs:  Patient profile:   65 year old female Height:      61.5 inches Weight:      188.75 pounds BMI:     35.21 Temp:     98.1 degrees F oral Pulse rate:   80 / minute Pulse rhythm:   regular BP sitting:   120 / 76  (left arm) Cuff size:   regular  Vitals Entered By: Benny Lennert CMA Duncan Dull) (May 04, 2010 8:34 AM)  History of Present Illness: Chief complaint 1 month follow up mood   Depression.. 1 month ago.. changed from sertraline to wellbutrin 150 mg.  She states that she is minimally calmer day to day.. still  tearful and very agitated and mean in stress ful situations.  Difficulty sleeping at night.Marland Kitchen 1-2 hours at night.   No SI.  I past celexa stopped working, Careers information officer not effective and expensive for her.  Sertraline helped initially in 2009.   Not interested in seeing counselor.  Problems Prior to Update: 1)  Preventive Health Care  (ICD-V70.0) 2)  Osteoarthrosis Unspec Whether Gen/localized Hand  (ICD-715.94) 3)  Unspecified Breast Screening  (ICD-V76.10) 4)  Common Migraine  (ICD-346.10) 5)  Anxiety  (ICD-300.00) 6)  Depression  (ICD-311) 7)  Pud  (ICD-533.90) 8)  Hypertension  (ICD-401.9) 9)  Hyperlipidemia  (ICD-272.4) 10)  Diabetes Mellitus, Type II  (ICD-250.00)  Current Medications (verified): 1)  Vytorin 10-80 Mg Tabs (Ezetimibe-Simvastatin) .Marland Kitchen.. 1 Tab By Mouth Daily 2)  Lisinopril 5 Mg Tabs (Lisinopril) .... Take 1 Tablet By Mouth Once A Day 3)  Nexium 40 Mg  Cpdr (Esomeprazole Magnesium) .Marland Kitchen.. 1 By Mouth Once Daily 4)  Bupropion Hcl 150 Mg Xr24h-Tab (Bupropion Hcl) .Marland Kitchen.. 1 Tab By Mouth Daily 5)  Hydrochlorothiazide 25 Mg  Tabs (Hydrochlorothiazide) .Marland Kitchen.. 1 By Mouth Once Daily 6)  Centrum Silver   Tabs (Multiple Vitamins-Minerals) .Marland Kitchen.. 1 By Mouth Once Daily 7)  Citracal + D 250-200 Mg-Unit  Tabs (Calcium Citrate-Vitamin D) .Marland Kitchen.. 1 By Mouth Once Daily 8)  Bayer Low Strength 81 Mg  Tbec (Aspirin) .Marland Kitchen.. 1 By  Mouth Once Daily 9)  Fish Oil   Oil (Fish Oil) .... Take 1 Capsule By Mouth Once A Day 10)  Bayer Breeze 2 Glucose Monitor Disc .... Check Blood Sugar 1-2 X Daily Dx 250.00 11)  Meloxicam 7.5 Mg Tabs (Meloxicam) .Marland Kitchen.. 1-2 Tab By Mouth Daily As Needed Pain 12)  Glucosamine-Chondroitin 500-400 Mg Caps (Glucosamine-Chondroitin) .... Take 1 Tablet By Mouth Three Times A Day  Allergies (verified): No Known Drug Allergies  Past History:  Past medical, surgical, family and social histories (including risk factors) reviewed, and no changes noted (except as noted below).  Past Medical History: Reviewed history from 12/21/2006 and no changes required. Diabetes mellitus, type II Hyperlipidemia Hypertension Depression Anxiety  Past Surgical History: Reviewed history from 12/21/2006 and no changes required. 1996 partial colectomy for ruptured abcess for diverticulitis right parotid gland removed Hysterectomy, partial for uterine prolapse breast biopsy  Family History: Reviewed history from 12/21/2006 and no changes required. mother Alzheimer's father no contact 1 brother no contact MGF: DM, CAD  Social History: Reviewed history from 12/21/2006 and no changes required. 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 CV:  Denies chest pain or discomfort. Resp:  Denies shortness of breath.  Physical Exam  General:  Well-developed,well-nourished,in no  acute distress; alert,appropriate and cooperative throughout examination Mouth:  MMM Lungs:  Normal respiratory effort, chest expands symmetrically. Lungs are clear to auscultation, no crackles or wheezes. Heart:  Normal rate and regular rhythm. S1 and S2 normal without gallop, murmur, click, rub or other extra sounds. Psych:  Cognition and judgment appear intact. Alert and cooperative with normal attention span and  concentration. No apparent delusions, illusions, hallucinations   Impression & Recommendations:  Problem # 1:  DEPRESSION (ICD-311) Wellbutrin activating in her.. causing insomnia..not effective for mood. Given SSRI helped in past.. wi;ll try lexapro if not to expensive for her. Follow up in 1 month.  The following medications were removed from the medication list:    Bupropion Hcl 150 Mg Xr24h-tab (Bupropion hcl) .Marland Kitchen... 1 tab by mouth daily Her updated medication list for this problem includes:    Lexapro 10 Mg Tabs (Escitalopram oxalate) .Marland Kitchen... Take 1 tablet by mouth once a day  Complete Medication List: 1)  Vytorin 10-80 Mg Tabs (Ezetimibe-simvastatin) .Marland Kitchen.. 1 tab by mouth daily 2)  Lisinopril 5 Mg Tabs (Lisinopril) .... Take 1 tablet by mouth once a day 3)  Nexium 40 Mg Cpdr (Esomeprazole magnesium) .Marland Kitchen.. 1 by mouth once daily 4)  Hydrochlorothiazide 25 Mg Tabs (Hydrochlorothiazide) .Marland Kitchen.. 1 by mouth once daily 5)  Centrum Silver Tabs (Multiple vitamins-minerals) .Marland Kitchen.. 1 by mouth once daily 6)  Citracal + D 250-200 Mg-unit Tabs (Calcium citrate-vitamin d) .Marland Kitchen.. 1 by mouth once daily 7)  Bayer Low Strength 81 Mg Tbec (Aspirin) .Marland Kitchen.. 1 by mouth once daily 8)  Fish Oil Oil (Fish oil) .... Take 1 capsule by mouth once a day 9)  Bayer Breeze 2 Glucose Monitor Disc  .... Check blood sugar 1-2 x daily dx 250.00 10)  Meloxicam 7.5 Mg Tabs (Meloxicam) .Marland Kitchen.. 1-2 tab by mouth daily as needed pain 11)  Glucosamine-chondroitin 500-400 Mg Caps (Glucosamine-chondroitin) .... Take 1 tablet by mouth three times a day 12)  Lexapro 10 Mg Tabs (Escitalopram oxalate) .... Take 1 tablet by mouth once a day  Patient Instructions: 1)  Start lexapro. 2)  Please schedule a follow-up appointment in 1 month depression. Prescriptions: LEXAPRO 10 MG TABS (ESCITALOPRAM OXALATE) Take 1 tablet by mouth once a day  #30 x 3   Entered and Authorized by:   Kerby Nora MD   Signed by:   Kerby Nora MD on 05/04/2010   Method  used:   [facility]        [facility]  [address] [id number]       [address]       [place], [place]  [address]       Ph: [phone] or [phone]       Fax: [phone]   RxID:   [id number]    Orders Added: 1)  Est. Patient Level III [36644]    Current Allergies (reviewed today): No known allergies

## 2010-05-20 NOTE — Assessment & Plan Note (Signed)
Summary: 6 M F/U DM HTN/DLO R/S FROM 04/01/10   Vital Signs:  Patient profile:   65 year old female Height:      61.5 inches Weight:      186.12 pounds BMI:     34.72 Temp:     98.4 degrees F oral Pulse rate:   80 / minute Pulse rhythm:   regular BP sitting:   120 / 80  (left arm) Cuff size:   regular  Vitals Entered By: Benny Lennert CMA Duncan Dull) (April 02, 2010 8:37 AM)  History of Present Illness: Chief complaint 6 month follow up   DM, very well controlled. Cheking blood sugar once a day.  High cholesterol... well controlled last check... LFTS nml, NO SE on vytorin.  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 occ left.  Using meloxicam.. helps spome but not as much as it used to. Also on glucosamine.   Depression.Marland Kitchen deteriorated.. more tearful.. not well controlled on sertraline.   Poor sleep at night... 3-4 hours a night.  Hypertension History:      She denies headache, chest pain, palpitations, dyspnea with exertion, orthopnea, peripheral edema, syncope, and side effects from treatment.        Positive major cardiovascular risk factors include female age 67 years old or older, diabetes, hyperlipidemia, and hypertension.  Negative major cardiovascular risk factors include non-tobacco-user status.     Problems Prior to Update: 1)  Preventive Health Care  (ICD-V70.0) 2)  Osteoarthrosis Unspec Whether Gen/localized Hand  (ICD-715.94) 3)  Unspecified Breast Screening  (ICD-V76.10) 4)  Common Migraine  (ICD-346.10) 5)  Anxiety  (ICD-300.00) 6)  Depression  (ICD-311) 7)  Pud  (ICD-533.90) 8)  Hypertension  (ICD-401.9) 9)  Hyperlipidemia  (ICD-272.4) 10)  Diabetes Mellitus, Type II  (ICD-250.00)  Current Medications (verified): 1)  Vytorin 10-80 Mg Tabs (Ezetimibe-Simvastatin) .Marland Kitchen.. 1 Tab By Mouth Daily 2)  Lisinopril 5 Mg Tabs (Lisinopril) .... Take 1 Tablet By Mouth Once A Day 3)  Nexium 40 Mg  Cpdr (Esomeprazole Magnesium) .Marland Kitchen.. 1 By Mouth Once Daily 4)   Sertraline Hcl 100 Mg Tabs (Sertraline Hcl) .... Take 1 Tablet By Mouth Once A Day 5)  Hydrochlorothiazide 25 Mg  Tabs (Hydrochlorothiazide) .Marland Kitchen.. 1 By Mouth Once Daily 6)  Centrum Silver   Tabs (Multiple Vitamins-Minerals) .Marland Kitchen.. 1 By Mouth Once Daily 7)  Citracal + D 250-200 Mg-Unit  Tabs (Calcium Citrate-Vitamin D) .Marland Kitchen.. 1 By Mouth Once Daily 8)  Bayer Low Strength 81 Mg  Tbec (Aspirin) .Marland Kitchen.. 1 By Mouth Once Daily 9)  Fish Oil   Oil (Fish Oil) .... Take 1 Capsule By Mouth Once A Day 10)  Bayer Breeze 2 Glucose Monitor Disc .... Check Blood Sugar 1-2 X Daily Dx 250.00 11)  Meloxicam 7.5 Mg Tabs (Meloxicam) .Marland Kitchen.. 1-2 Tab By Mouth Daily As Needed Pain 12)  Glucosamine-Chondroitin 500-400 Mg Caps (Glucosamine-Chondroitin) .... Take 1 Tablet By Mouth Three Times A Day  Allergies (verified): No Known Drug Allergies  Past History:  Past medical, surgical, family and social histories (including risk factors) reviewed, and no changes noted (except as noted below).  Past Medical History: Reviewed history from 12/21/2006 and no changes required. Diabetes mellitus, type II Hyperlipidemia Hypertension Depression Anxiety  Past Surgical History: Reviewed history from 12/21/2006 and no changes required. 1996 partial colectomy for ruptured abcess for diverticulitis right parotid gland removed Hysterectomy, partial for uterine prolapse breast biopsy  Family History: Reviewed history from 12/21/2006 and no changes required. mother Alzheimer's father no  contact 1 brother no contact MGF: DM, CAD  Social History: Reviewed history from 12/21/2006 and no changes required. Married Occupation: Daycare Never Smoked Alcohol use-no Drug use-no Regular exercise-no Diet: poor diet, fast food  Review of Systems General:  Denies fatigue. CV:  Denies chest pain or discomfort. Resp:  occ cough with allergies sneezing Using Claritin Using allergy drops in eyes.Marland Kitchen GI:  Denies abdominal pain. GU:   Denies dysuria.  Physical Exam  General:  fatigued appearing  in NAD, overweight  Ears:  External ear exam shows no significant lesions or deformities.  Otoscopic examination reveals clear canals, tympanic membranes are intact bilaterally without bulging, retraction, inflammation or discharge. Hearing is grossly normal bilaterally. Nose:  nasal dischargemucosal pallor.   Mouth:  Oral mucosa and oropharynx without lesions or exudates.  Teeth in good repair. Neck:  no carotid bruit or thyromegaly no cervical or supraclavicular lymphadenopathy  Lungs:  Normal respiratory effort, chest expands symmetrically. Lungs are clear to auscultation, no crackles or wheezes. Heart:  Normal rate and regular rhythm. S1 and S2 normal without gallop, murmur, click, rub or other extra sounds. Abdomen:  Bowel sounds positive,abdomen soft and non-tender without masses, organomegaly or hernias noted. Msk:  Pain in B CMC joint, swelling, no redness...pain with grind test of CMC joints.  Decreased grip strength due to pain  Pulses:  R and L posterior tibial pulses are full and equal bilaterally  Extremities:  no edema   Diabetes Management Exam:    Foot Exam (with socks and/or shoes not present):       Sensory-Pinprick/Light touch:          Left medial foot (L-4): normal          Left dorsal foot (L-5): normal          Left lateral foot (S-1): normal          Right medial foot (L-4): normal          Right dorsal foot (L-5): normal          Right lateral foot (S-1): normal       Sensory-Monofilament:          Left foot: normal          Right foot: normal       Inspection:          Left foot: normal          Right foot: normal       Nails:          Left foot: normal          Right foot: normal   Impression & Recommendations:  Problem # 1:  OSTEOARTHROSIS UNSPEC WHETHER GEN/LOCALIZED HAND (ICD-715.94) CMC joint arthritis Her updated medication list for this problem includes:    Bayer Low Strength 81 Mg  Tbec (Aspirin) .Marland Kitchen... 1 by mouth once daily    Meloxicam 7.5 Mg Tabs (Meloxicam) .Marland Kitchen... 1-2 tab by mouth daily as needed pain  Orders: Orthopedic Referral (Ortho)  Problem # 2:  DEPRESSION (ICD-311) Poor control... cahnge sertraline to wellbutrin.  Her updated medication list for this problem includes:    Bupropion Hcl 150 Mg Xr24h-tab (Bupropion hcl) .Marland Kitchen... 1 tab by mouth daily  Problem # 3:  HYPERTENSION (ICD-401.9)  Well controlled. Continue current medication.  Her updated medication list for this problem includes:    Lisinopril 5 Mg Tabs (Lisinopril) .Marland Kitchen... Take 1 tablet by mouth once a day    Hydrochlorothiazide 25 Mg Tabs (Hydrochlorothiazide) .Marland KitchenMarland KitchenMarland KitchenMarland Kitchen  1 by mouth once daily  BP today: 120/80 Prior BP: 120/70 (10/02/2009)  10 Yr Risk Heart Disease: 13 % Prior 10 Yr Risk Heart Disease: 20 % (04/22/2009)  Labs Reviewed: K+: 4.5 (09/23/2009) Creat: : 0.6 (09/23/2009)   Chol: 157 (09/23/2009)   HDL: 50.10 (09/23/2009)   LDL: 88 (09/23/2009)   TG: 97.0 (09/23/2009)  Problem # 4:  DIABETES MELLITUS, TYPE II (ICD-250.00) Well controlled with diet.  Her updated medication list for this problem includes:    Lisinopril 5 Mg Tabs (Lisinopril) .Marland Kitchen... Take 1 tablet by mouth once a day    Bayer Low Strength 81 Mg Tbec (Aspirin) .Marland Kitchen... 1 by mouth once daily  Problem # 5:  HYPERLIPIDEMIA (ICD-272.4) Well controlled. Continue current medication.  Her updated medication list for this problem includes:    Vytorin 10-80 Mg Tabs (Ezetimibe-simvastatin) .Marland Kitchen... 1 tab by mouth daily  Labs Reviewed: SGOT: 20 (03/24/2010)   SGPT: 18 (03/24/2010)  Lipid Goals: Chol Goal: 200 (01/14/2008)   HDL Goal: 40 (01/14/2008)   LDL Goal: 100 (01/14/2008)   TG Goal: 150 (01/14/2008)  10 Yr Risk Heart Disease: 13 % Prior 10 Yr Risk Heart Disease: 20 % (04/22/2009)   HDL:50.10 (09/23/2009), 49.60 (04/01/2009)  LDL:88 (09/23/2009), 89 (04/01/2009)  Chol:157 (09/23/2009), 157 (04/01/2009)  Trig:97.0 (09/23/2009),  93.0 (04/01/2009)  Complete Medication List: 1)  Vytorin 10-80 Mg Tabs (Ezetimibe-simvastatin) .Marland Kitchen.. 1 tab by mouth daily 2)  Lisinopril 5 Mg Tabs (Lisinopril) .... Take 1 tablet by mouth once a day 3)  Nexium 40 Mg Cpdr (Esomeprazole magnesium) .Marland Kitchen.. 1 by mouth once daily 4)  Bupropion Hcl 150 Mg Xr24h-tab (Bupropion hcl) .Marland Kitchen.. 1 tab by mouth daily 5)  Hydrochlorothiazide 25 Mg Tabs (Hydrochlorothiazide) .Marland Kitchen.. 1 by mouth once daily 6)  Centrum Silver Tabs (Multiple vitamins-minerals) .Marland Kitchen.. 1 by mouth once daily 7)  Citracal + D 250-200 Mg-unit Tabs (Calcium citrate-vitamin d) .Marland Kitchen.. 1 by mouth once daily 8)  Bayer Low Strength 81 Mg Tbec (Aspirin) .Marland Kitchen.. 1 by mouth once daily 9)  Fish Oil Oil (Fish oil) .... Take 1 capsule by mouth once a day 10)  Bayer Breeze 2 Glucose Monitor Disc  .... Check blood sugar 1-2 x daily dx 250.00 11)  Meloxicam 7.5 Mg Tabs (Meloxicam) .Marland Kitchen.. 1-2 tab by mouth daily as needed pain 12)  Glucosamine-chondroitin 500-400 Mg Caps (Glucosamine-chondroitin) .... Take 1 tablet by mouth three times a day  Hypertension Assessment/Plan:      The patient's hypertensive risk group is category C: Target organ damage and/or diabetes.  Her calculated 10 year risk of coronary heart disease is 13 %.  Today's blood pressure is 120/80.  Her blood pressure goal is < 130/80.  Patient Instructions: 1)  Schedule welcome to medicare visit or CPX in 6 months... 45 min appt. 2)  Fasting labs prior... lipids, CMET, A1C, microalbumin Dx 250.00, 272.0 3)  May use claritin or zyrtec for allergies as needed. 4)  Stop sertraline.. change to wellbutrin... take in AMs. 5)  Please schedule a follow-up appointment in 1 month for mood.  6)   Referral Appointment Information 7)  Day/Date: 8)  Time: 9)  Place/MD: 10)  Address: 11)  Phone/Fax: 12)  Patient given appointment information. Information/Orders faxed/mailed.  Prescriptions: BAYER BREEZE 2 GLUCOSE MONITOR DISC Check blood sugar 1-2 x daily Dx  250.00  #1 box x 11   Entered and Authorized by:   Kerby Nora MD   Signed by:   Kerby Nora MD on 04/02/2010  Method used:   Print then Give to Patient   RxID:   [id number] BUPROPION HCL 150 MG XR24H-TAB (BUPROPION HCL) 1 tab by mouth daily  #30 x 5   Entered and Authorized by:   Kerby Nora MD   Signed by:   Kerby Nora MD on 04/02/2010   Method used:   [facility]ron[facility] to        [facility]  [address] (retail)       [address]       [place], [place]  [address]       Ph: [phone] or [phone]       Fax: [phone]   RxID:   [id number]    Orders Added: 1)  Orthopedic Referral [Ortho] 2)  Est. Patient Level IV [[address]]    Current Allergies (reviewed today): No known allergies

## 2010-05-20 NOTE — Progress Notes (Signed)
Summary: lexapro is too costly  Phone Note From Pharmacy   Caller: [facility]  [id number] Summary of Call: Pharmacy is asking to change lexapro to something less costly.  Pt's cost for lexapro is $135.00.                 Lowella Petties CMA, AAMA  May 04, 2010 10:19 AM   Follow-up for Phone Call        Change to fluxoetine  Follow-up by: Kerby Nora MD,  May 04, 2010 10:33 AM    New/Updated Medications: FLUOXETINE HCL 20 MG CAPS (FLUOXETINE HCL) Take 1 tablet by mouth once a day Prescriptions: FLUOXETINE HCL 20 MG CAPS (FLUOXETINE HCL) Take 1 tablet by mouth once a day  #30 x 3   Entered and Authorized by:   Kerby Nora MD   Signed by:   Kerby Nora MD on 05/04/2010   Method used:   [facility]        [facility]  [address] (retail)       [address]       [place], [place]  [address]       Ph: [phone] or [phone]       Fax: [phone]   RxID:   [id number]

## 2010-05-31 ENCOUNTER — Ambulatory Visit (INDEPENDENT_AMBULATORY_CARE_PROVIDER_SITE_OTHER): Payer: BC Managed Care – PPO | Admitting: Family Medicine

## 2010-05-31 ENCOUNTER — Encounter: Payer: Self-pay | Admitting: Family Medicine

## 2010-05-31 DIAGNOSIS — F329 Major depressive disorder, single episode, unspecified: Secondary | ICD-10-CM

## 2010-05-31 DIAGNOSIS — F411 Generalized anxiety disorder: Secondary | ICD-10-CM

## 2010-06-09 NOTE — Assessment & Plan Note (Signed)
Vital Signs:  Patient profile:   65 year old female Weight:      183.50 pounds Temp:     98.2 degrees F oral Pulse rate:   72 / minute Pulse rhythm:   regular BP sitting:   106 / 58  (left arm) Cuff size:   large  Vitals Entered By: Sydell Axon LPN (May 31, 2010 8:31 AM) CC: Follow-up on new medication   History of Present Illness: Depression: seen last 1/17 ... wellbutrin  not working adequately for mood...tried lexapro, but too costly.  She has now been on fluoxetine for mood for 1 month at 20 mg tabs. Per pt she is dping better... less tearful, more optimistic. No anxiety attack.  70% improvement. No SI.  Sleeping 5-6 hours a night. No specific SE.   Stress continues in life... has new great grandson.  Problems Prior to Update: 1)  Preventive Health Care  (ICD-V70.0) 2)  Osteoarthrosis Unspec Whether Gen/localized Hand  (ICD-715.94) 3)  Unspecified Breast Screening  (ICD-V76.10) 4)  Common Migraine  (ICD-346.10) 5)  Anxiety  (ICD-300.00) 6)  Depression  (ICD-311) 7)  Pud  (ICD-533.90) 8)  Hypertension  (ICD-401.9) 9)  Hyperlipidemia  (ICD-272.4) 10)  Diabetes Mellitus, Type II  (ICD-250.00)  Current Medications (verified): 1)  Vytorin 10-80 Mg Tabs (Ezetimibe-Simvastatin) .Marland Kitchen.. 1 Tab By Mouth Daily 2)  Lisinopril 5 Mg Tabs (Lisinopril) .... Take 1 Tablet By Mouth Once A Day 3)  Nexium 40 Mg  Cpdr (Esomeprazole Magnesium) .Marland Kitchen.. 1 By Mouth Once Daily 4)  Hydrochlorothiazide 25 Mg  Tabs (Hydrochlorothiazide) .Marland Kitchen.. 1 By Mouth Once Daily 5)  Centrum Silver   Tabs (Multiple Vitamins-Minerals) .Marland Kitchen.. 1 By Mouth Once Daily 6)  Citracal + D 250-200 Mg-Unit  Tabs (Calcium Citrate-Vitamin D) .Marland Kitchen.. 1 By Mouth Once Daily 7)  Bayer Low Strength 81 Mg  Tbec (Aspirin) .Marland Kitchen.. 1 By Mouth Once Daily 8)  Fish Oil   Oil (Fish Oil) .... Take 1 Capsule By Mouth Once A Day 9)  Bayer Breeze 2 Glucose Monitor Disc .... Check Blood Sugar 1-2 X Daily Dx 250.00 10)  Meloxicam 7.5 Mg Tabs  (Meloxicam) .Marland Kitchen.. 1-2 Tab By Mouth Daily As Needed Pain 11)  Glucosamine-Chondroitin 500-400 Mg Caps (Glucosamine-Chondroitin) .... Take 1 Tablet By Mouth Three Times A Day 12)  Fluoxetine Hcl 20 Mg Caps (Fluoxetine Hcl) .... Take 1 Tablet By Mouth Once A Day  Allergies (verified): No Known Drug Allergies  Past History:  Past medical, surgical, family and social histories (including risk factors) reviewed, and no changes noted (except as noted below).  Past Medical History: Reviewed history from 12/21/2006 and no changes required. Diabetes mellitus, type II Hyperlipidemia Hypertension Depression Anxiety  Past Surgical History: Reviewed history from 12/21/2006 and no changes required. 1996 partial colectomy for ruptured abcess for diverticulitis right parotid gland removed Hysterectomy, partial for uterine prolapse breast biopsy  Family History: Reviewed history from 12/21/2006 and no changes required. mother Alzheimer's father no contact 1 brother no contact MGF: DM, CAD  Social History: Reviewed history from 12/21/2006 and no changes required. 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 CV:  Denies chest pain or discomfort. Resp:  Denies shortness of breath.  Physical Exam  General:  Well-developed,well-nourished,in no acute distress; alert,appropriate and cooperative throughout examination Mouth:  MMM Neck:  no carotid bruit or thyromegaly no cervical or supraclavicular lymphadenopathy  Lungs:  Normal respiratory effort, chest expands symmetrically. Lungs are clear to auscultation, no crackles or wheezes. Heart:  Normal rate and regular rhythm. S1 and S2 normal without gallop, murmur, click, rub or other extra sounds. Pulses:  R and L posterior tibial pulses are full and equal bilaterally    Impression & Recommendations:  Problem #  1:  DEPRESSION (ICD-311) Assessment Improved Well controlled. Continue current medication.  Her updated medication list for this problem includes:    Fluoxetine Hcl 20 Mg Caps (Fluoxetine hcl) .Marland Kitchen... Take 1 tablet by mouth once a day  Problem # 2:  ANXIETY (ICD-300.00) Assessment: Improved  Her updated medication list for this problem includes:    Fluoxetine Hcl 20 Mg Caps (Fluoxetine hcl) .Marland Kitchen... Take 1 tablet by mouth once a day  Complete Medication List: 1)  Vytorin 10-80 Mg Tabs (Ezetimibe-simvastatin) .Marland Kitchen.. 1 tab by mouth daily 2)  Lisinopril 5 Mg Tabs (Lisinopril) .... Take 1 tablet by mouth once a day 3)  Nexium 40 Mg Cpdr (Esomeprazole magnesium) .Marland Kitchen.. 1 by mouth once daily 4)  Hydrochlorothiazide 25 Mg Tabs (Hydrochlorothiazide) .Marland Kitchen.. 1 by mouth once daily 5)  Centrum Silver Tabs (Multiple vitamins-minerals) .Marland Kitchen.. 1 by mouth once daily 6)  Citracal + D 250-200 Mg-unit Tabs (Calcium citrate-vitamin d) .Marland Kitchen.. 1 by mouth once daily 7)  Bayer Low Strength 81 Mg Tbec (Aspirin) .Marland Kitchen.. 1 by mouth once daily 8)  Fish Oil Oil (Fish oil) .... Take 1 capsule by mouth once a day 9)  Bayer Breeze 2 Glucose Monitor Disc  .... Check blood sugar 1-2 x daily dx 250.00 10)  Meloxicam 7.5 Mg Tabs (Meloxicam) .Marland Kitchen.. 1-2 tab by mouth daily as needed pain 11)  Glucosamine-chondroitin 500-400 Mg Caps (Glucosamine-chondroitin) .... Take 1 tablet by mouth three times a day 12)  Fluoxetine Hcl 20 Mg Caps (Fluoxetine hcl) .... Take 1 tablet by mouth once a day  Patient Instructions: 1)  Continue on medictiton at current dose. Call if mood not continuing to improve. 2)   Keep appt in [place].   Orders Added: 1)  Est. Patient Level II [[address]]    Current Allergies (reviewed today): No known allergies

## 2010-06-14 ENCOUNTER — Encounter: Payer: Self-pay | Admitting: Family Medicine

## 2010-06-14 ENCOUNTER — Ambulatory Visit (INDEPENDENT_AMBULATORY_CARE_PROVIDER_SITE_OTHER): Payer: BC Managed Care – PPO | Admitting: Family Medicine

## 2010-06-14 DIAGNOSIS — J209 Acute bronchitis, unspecified: Secondary | ICD-10-CM | POA: Insufficient documentation

## 2010-06-24 ENCOUNTER — Telehealth: Payer: Self-pay | Admitting: Family Medicine

## 2010-06-24 NOTE — Assessment & Plan Note (Signed)
Summary: COUGH,COLD/CLE   [employer]   Vital Signs:  Patient profile:   65 year old female Height:      61.5 inches Weight:      181.50 pounds BMI:     33.86 O2 Sat:      99 % Temp:     97.8 degrees F oral Pulse rate:   88 / minute Pulse rhythm:   regular Resp:     18 per minute BP sitting:   110 / 80  (left arm) Cuff size:   regular  Vitals Entered By: Benny Lennert CMA Duncan Dull) (June 14, 2010 10:45 AM)  History of Present Illness: Chief complaint cough and cold  Acute Visit History:      The patient complains of cough, headache, sinus problems, and sore throat.  These symptoms began 1 week ago.  She denies chest pain, earache, fever, and nausea.  Other comments include: left facial pressure/pain fatigue Getting worse not better. Using robitussin OTC.        The character of the cough is described as productive.  There is no history of wheezing or shortness of breath associated with her cough.        She complains of sinus pressure.        Problems Prior to Update: 1)  Preventive Health Care  (ICD-V70.0) 2)  Osteoarthrosis Unspec Whether Gen/localized Hand  (ICD-715.94) 3)  Unspecified Breast Screening  (ICD-V76.10) 4)  Common Migraine  (ICD-346.10) 5)  Anxiety  (ICD-300.00) 6)  Depression  (ICD-311) 7)  Pud  (ICD-533.90) 8)  Hypertension  (ICD-401.9) 9)  Hyperlipidemia  (ICD-272.4) 10)  Diabetes Mellitus, Type II  (ICD-250.00)  Current Medications (verified): 1)  Vytorin 10-80 Mg Tabs (Ezetimibe-Simvastatin) .Marland Kitchen.. 1 Tab By Mouth Daily 2)  Lisinopril 5 Mg Tabs (Lisinopril) .... Take 1 Tablet By Mouth Once A Day 3)  Nexium 40 Mg  Cpdr (Esomeprazole Magnesium) .Marland Kitchen.. 1 By Mouth Once Daily 4)  Hydrochlorothiazide 25 Mg  Tabs (Hydrochlorothiazide) .Marland Kitchen.. 1 By Mouth Once Daily 5)  Centrum Silver   Tabs (Multiple Vitamins-Minerals) .Marland Kitchen.. 1 By Mouth Once Daily 6)  Citracal + D 250-200 Mg-Unit  Tabs (Calcium Citrate-Vitamin D) .Marland Kitchen.. 1 By Mouth Once Daily 7)  Bayer Low Strength 81 Mg   Tbec (Aspirin) .Marland Kitchen.. 1 By Mouth Once Daily 8)  Fish Oil   Oil (Fish Oil) .... Take 1 Capsule By Mouth Once A Day 9)  Bayer Breeze 2 Glucose Monitor Disc .... Check Blood Sugar 1-2 X Daily Dx 250.00 10)  Meloxicam 7.5 Mg Tabs (Meloxicam) .Marland Kitchen.. 1-2 Tab By Mouth Daily As Needed Pain 11)  Glucosamine-Chondroitin 500-400 Mg Caps (Glucosamine-Chondroitin) .... Take 1 Tablet By Mouth Three Times A Day 12)  Fluoxetine Hcl 20 Mg Caps (Fluoxetine Hcl) .... Take 1 Tablet By Mouth Once A Day  Allergies (verified): No Known Drug Allergies  Past History:  Past medical, surgical, family and social histories (including risk factors) reviewed, and no changes noted (except as noted below).  Past Medical History: Reviewed history from 12/21/2006 and no changes required. Diabetes mellitus, type II Hyperlipidemia Hypertension Depression Anxiety  Past Surgical History: Reviewed history from 12/21/2006 and no changes required. 1996 partial colectomy for ruptured abcess for diverticulitis right parotid gland removed Hysterectomy, partial for uterine prolapse breast biopsy  Family History: Reviewed history from 12/21/2006 and no changes required. mother Alzheimer's father no contact 1 brother no contact MGF: DM, CAD  Social History: Reviewed history from 12/21/2006 and no changes required. 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 ENT:  Denies nosebleeds. CV:  Denies chest pain or discomfort. GI:  Denies abdominal pain. GU:  Denies dysuria.  Physical Exam  General:  overweight female in NAD  Head:  right maxiallry isnus ttp Ears:  External ear exam shows no significant lesions or deformities.  Otoscopic examination reveals clear canals, tympanic membranes are intact bilaterally without bulging, retraction, inflammation or discharge. Hearing is grossly normal  bilaterally. Nose:  clear nasal discharge Mouth:  Oral mucosa and oropharynx without lesions or exudates.  Teeth in good repair. Neck:  no carotid bruit or thyromegaly no cervical or supraclavicular lymphadenopathy  Lungs:  Normal respiratory effort, chest expands symmetrically. Lungs are clear to auscultation, no crackles or wheezes.  Freuent cough Heart:  Normal rate and regular rhythm. S1 and S2 normal without gallop, murmur, click, rub or other extra sounds. Pulses:  R and L posterior tibial pulses are full and equal bilaterally  Extremities:  no edema    Impression & Recommendations:  Problem # 1:  ACUTE BRONCHITIS (ICD-466.0) Worsening 1 week out and possible beginnings of maxillary sinusitis... cover for bacterial inmvolvement with azithromycin. Offered cough supressant.  Symptomatic care.   Her updated medication list for this problem includes:    Azithromycin 250 Mg Tabs (Azithromycin) .Marland Kitchen... 2 tab by mouth x 1 day, then 1 tab by mouth daily  Complete Medication List: 1)  Vytorin 10-80 Mg Tabs (Ezetimibe-simvastatin) .Marland Kitchen.. 1 tab by mouth daily 2)  Lisinopril 5 Mg Tabs (Lisinopril) .... Take 1 tablet by mouth once a day 3)  Nexium 40 Mg Cpdr (Esomeprazole magnesium) .Marland Kitchen.. 1 by mouth once daily 4)  Hydrochlorothiazide 25 Mg Tabs (Hydrochlorothiazide) .Marland Kitchen.. 1 by mouth once daily 5)  Centrum Silver Tabs (Multiple vitamins-minerals) .Marland Kitchen.. 1 by mouth once daily 6)  Citracal + D 250-200 Mg-unit Tabs (Calcium citrate-vitamin d) .Marland Kitchen.. 1 by mouth once daily 7)  Bayer Low Strength 81 Mg Tbec (Aspirin) .Marland Kitchen.. 1 by mouth once daily 8)  Fish Oil Oil (Fish oil) .... Take 1 capsule by mouth once a day 9)  Bayer Breeze 2 Glucose Monitor Disc  .... Check blood sugar 1-2 x daily dx 250.00 10)  Meloxicam 7.5 Mg Tabs (Meloxicam) .Marland Kitchen.. 1-2 tab by mouth daily as needed pain 11)  Glucosamine-chondroitin 500-400 Mg Caps (Glucosamine-chondroitin) .... Take 1 tablet by mouth three times a day 12)  Fluoxetine  Hcl 20 Mg Caps (Fluoxetine hcl) .... Take 1 tablet by mouth once a day 13)  Azithromycin 250 Mg Tabs (Azithromycin) .... 2 tab by mouth x 1 day, then 1 tab by mouth daily  Patient Instructions: 1)  Take your antibiotic as prescribed until ALL of it is gone, but stop if you develop a rash or swelling and contact our office as soon as possible.  2)  Push fluids. 3)   Robitussin or Mucinex for cough.  4)  Call if not imprpving in next 3-4 days... go ER if SOB.  Prescriptions: AZITHROMYCIN 250 MG TABS (AZITHROMYCIN) 2 tab by mouth x 1 day, then 1 tab by mouth daily  #6 x 0   Entered and Authorized by:   Kerby Nora MD   Signed by:   Kerby Nora MD on 06/14/2010   Method used:   [facility] to        [facility] [phone]* (retail)       [address]  [place], [place]  [address]       Ph: [phone] or [phone]       Fax: [phone]   RxID:   [id number]    Orders Added: 1)  Est. Patient Level III [64403]    Current Allergies (reviewed today): No known allergies

## 2010-06-29 NOTE — Progress Notes (Signed)
Summary: pt requests antibiotic  Phone Note Call from Patient Call back at Home Phone [phone]   Caller: Patient Call For: Crystal Nora MD Summary of Call: Pt was seen on 2/27 for bronchitis/ sinus infection and given z-pack.  She took this and felt better but now feels bad again, with cough.  No other symptoms.   She thinks she needs more antibiotic.  [facility]  Please let pt know. Initial call taken by: Lowella Petties CMA, AAMA,  June 24, 2010 9:34 AM  Follow-up for Phone Call        Will broaden antibitoic to fluroquinolone.   If not improving will need follow up appt.  if severe SOb... go to ER.  Follow-up by: Crystal Nora MD,  June 24, 2010 4:19 PM  Additional Follow-up for Phone Call Additional follow up Details #1::        patient advised.Consuello Masse CMA   Additional Follow-up by: Benny Lennert CMA Duncan Dull),  June 24, 2010 4:43 PM    New/Updated Medications: AVELOX 400 MG TABS (MOXIFLOXACIN HCL) 1 tab by mouth daily x 5 days Prescriptions: AVELOX 400 MG TABS (MOXIFLOXACIN HCL) 1 tab by mouth daily x 5 days  #5 x 0   Entered and Authorized by:   Crystal Nora MD   Signed by:   Crystal Nora MD on 06/24/2010   Method used:   [facility]        [facility] (retail)       [address]       [place], [place]  [address]       Ph: [phone] or [phone]       Fax: [phone]   RxID:   [id number]

## 2010-07-01 ENCOUNTER — Encounter: Payer: Self-pay | Admitting: Family Medicine

## 2010-07-01 ENCOUNTER — Ambulatory Visit (INDEPENDENT_AMBULATORY_CARE_PROVIDER_SITE_OTHER): Payer: Medicare Other | Admitting: Family Medicine

## 2010-07-01 DIAGNOSIS — R059 Cough, unspecified: Secondary | ICD-10-CM

## 2010-07-01 DIAGNOSIS — J209 Acute bronchitis, unspecified: Secondary | ICD-10-CM

## 2010-07-01 DIAGNOSIS — R05 Cough: Secondary | ICD-10-CM

## 2010-07-06 NOTE — Assessment & Plan Note (Signed)
Summary: COUGH,CONGESTION/CLE  [employer]   Vital Signs:  Patient profile:   65 year old female Height:      61.5 inches Weight:      179.25 pounds BMI:     33.44 Temp:     97.9 degrees F oral Pulse rate:   80 / minute Pulse rhythm:   regular BP sitting:   110 / 70  (left arm) Cuff size:   regular  Vitals Entered By: Benny Lennert CMA Duncan Dull) (July 01, 2010 8:32 AM)  History of Present Illness: Chief complaint cough and congestion  Acute Visit History:      The patient complains of cough.  These symptoms began 1 month ago.  She denies fever, headache, nausea, sinus problems, sore throat, and vomiting.        The cough interferes with her sleep.  The character of the cough is described as productive.  There is no history of wheezing, shortness of breath, respiratory retractions, tachypnea, cyanosis, or interference with oral intake associated with her cough.        Urine output has been normal.  She is tolerating clear liquids.        Allergies (verified): No Known Drug Allergies  Past History:  Past medical, surgical, family and social histories (including risk factors) reviewed, and no changes noted (except as noted below).  Past Medical History: Reviewed history from 12/21/2006 and no changes required. Diabetes mellitus, type II Hyperlipidemia Hypertension Depression Anxiety  Past Surgical History: Reviewed history from 12/21/2006 and no changes required. 1996 partial colectomy for ruptured abcess for diverticulitis right parotid gland removed Hysterectomy, partial for uterine prolapse breast biopsy  Family History: Reviewed history from 12/21/2006 and no changes required. mother Alzheimer's father no contact 1 brother no contact MGF: DM, CAD  Social History: Reviewed history from 12/21/2006 and no changes required. 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 CV: No chest pain or SOB GI: No noted N or V Otherwise, pertinent positives and negatives are noted in the HPI.   Physical Exam  Additional Exam:  GEN: A and O x 3. WDWN. NAD.    ENT: Nose clear, ext NML.  No LAD.  No JVD.  TM's clear. Oropharynx clear.  PULM: Normal WOB, no distress. No crackles, coarse BS diffusely with occ rattle, no rhonchi CV: RRR, no M/G/R, No rubs, No JVD.   ABD: S, NT, ND, + BS. No rebound. No guarding. No HSM.   EXT: warm and well-perfused, No c/c/e. PSYCH: Pleasant and conversant.    Impression & Recommendations:  Problem # 1:  ACUTE BRONCHITIS (ICD-466.0) Assessment Deteriorated clinically worsening suspect that she was partially treated with avelox - she got partly better, but without full recovery and became sick  again will treat with 10 day course of LVQ  Her updated medication list for this problem includes:    Levofloxacin 500 Mg Tabs (Levofloxacin) .Marland Kitchen... 1 by mouth daily    Hydrocodone-homatropine 5-1.5 Mg/84ml Syrp (Hydrocodone-homatropine) .Marland Kitchen... 1 tsp by mouth at bedtime as needed cough  Problem # 2:  COUGH (ICD-786.2) Assessment: New  Complete Medication List: 1)  Vytorin 10-80 Mg Tabs (Ezetimibe-simvastatin) .Marland Kitchen.. 1 tab by mouth daily 2)  Lisinopril 5 Mg Tabs (Lisinopril) .... Take 1 tablet by mouth once a day 3)  Nexium 40 Mg Cpdr (Esomeprazole magnesium) .Marland Kitchen.. 1 by mouth  once daily 4)  Hydrochlorothiazide 25 Mg Tabs (Hydrochlorothiazide) .Marland Kitchen.. 1 by mouth once daily 5)  Centrum Silver Tabs (Multiple vitamins-minerals) .Marland Kitchen.. 1 by mouth once daily 6)  Citracal + D 250-200 Mg-unit Tabs (Calcium citrate-vitamin d) .Marland Kitchen.. 1 by mouth once daily 7)  Bayer Low Strength 81 Mg Tbec (Aspirin) .Marland Kitchen.. 1 by mouth once daily 8)  Fish Oil Oil (Fish oil) .... Take 1 capsule by mouth once a day 9)  Bayer Breeze 2 Glucose Monitor Disc  .... Check blood sugar 1-2 x daily dx 250.00 10)  Meloxicam 7.5 Mg Tabs  (Meloxicam) .Marland Kitchen.. 1-2 tab by mouth daily as needed pain 11)  Glucosamine-chondroitin 500-400 Mg Caps (Glucosamine-chondroitin) .... Take 1 tablet by mouth three times a day 12)  Fluoxetine Hcl 20 Mg Caps (Fluoxetine hcl) .... Take 1 tablet by mouth once a day 13)  Levofloxacin 500 Mg Tabs (Levofloxacin) .Marland Kitchen.. 1 by mouth daily 14)  Hydrocodone-homatropine 5-1.5 Mg/67ml Syrp (Hydrocodone-homatropine) .Marland Kitchen.. 1 tsp by mouth at bedtime as needed cough  Patient Instructions: 1)  BRONCHITIS 2)  -Viral or baterial infections of the lung. Fever, cough, chest pain, shortness of breath, phlegm production, fatigue are symptoms. 3)  Treatment: 4)  1. Take all medicines 5)  2. Antibiotics  6)  3. Cough suppressants 7)  4. Bronchodilators: an inhaler 8)  5. Expectorant like Guaifenesin (Robitussin, Mucinex) 9)  Fluids and Moisture help: drink lots of fluids 10)  Vaporizier or humidifier in room, shower steam 11)  --help loosen secretions and sooth breathing passages 12)  Elevate head slightly when trying to sleep.  Prescriptions: HYDROCODONE-HOMATROPINE 5-1.5 MG/5ML SYRP (HYDROCODONE-HOMATROPINE) 1 tsp by mouth at bedtime as needed cough  #240 mL x 0   Entered and Authorized by:   Hannah Beat MD   Signed by:   Hannah Beat MD on 07/01/2010   Method used:   Print then Give to Patient   RxID:   [id number] LEVOFLOXACIN 500 MG TABS (LEVOFLOXACIN) 1 by mouth daily  #10 x 0   Entered and Authorized by:   Hannah Beat MD   Signed by:   Hannah Beat MD on 07/01/2010   Method used:   [facility]        [facility] (retail)       [address]       [place], [place]  [address]       Ph: [phone] or [phone]       Fax: [phone]   RxID:   [id number]    Orders Added: 1)  Est. Patient Level IV [88416]    Current Allergies (reviewed today): No known allergies

## 2010-07-29 ENCOUNTER — Other Ambulatory Visit: Payer: Self-pay | Admitting: Family Medicine

## 2010-08-13 ENCOUNTER — Other Ambulatory Visit: Payer: Self-pay | Admitting: Family Medicine

## 2010-08-28 ENCOUNTER — Other Ambulatory Visit: Payer: Self-pay | Admitting: Family Medicine

## 2010-10-06 ENCOUNTER — Encounter: Payer: Self-pay | Admitting: Family Medicine

## 2010-10-06 ENCOUNTER — Other Ambulatory Visit (INDEPENDENT_AMBULATORY_CARE_PROVIDER_SITE_OTHER): Payer: Medicare Other

## 2010-10-06 DIAGNOSIS — E7800 Pure hypercholesterolemia, unspecified: Secondary | ICD-10-CM

## 2010-10-06 DIAGNOSIS — E119 Type 2 diabetes mellitus without complications: Secondary | ICD-10-CM

## 2010-10-06 LAB — COMPREHENSIVE METABOLIC PANEL
Albumin: 4.7 g/dL (ref 3.5–5.2)
BUN: 18 mg/dL (ref 6–23)
CO2: 29 mEq/L (ref 19–32)
Calcium: 9.7 mg/dL (ref 8.4–10.5)
GFR: 128.53 mL/min (ref >60.00)
Glucose, Bld: 98 mg/dL (ref 70–99)
Potassium: 4.4 mEq/L (ref 3.5–5.1)
Sodium: 139 mEq/L (ref 135–145)
Total Protein: 6.6 g/dL (ref 6.0–8.3)

## 2010-10-06 LAB — HEMOGLOBIN A1C: Hgb A1c MFr Bld: 5.9 % (ref 4.6–6.5)

## 2010-10-06 LAB — LIPID PANEL
Cholesterol: 163 mg/dL (ref 0–200)
HDL: 51.5 mg/dL (ref >39.00)

## 2010-10-06 LAB — MICROALBUMIN / CREATININE URINE RATIO: Microalb, Ur: 1.2 mg/dL (ref 0.0–1.9)

## 2010-10-11 ENCOUNTER — Encounter: Payer: Self-pay | Admitting: Family Medicine

## 2010-10-11 ENCOUNTER — Ambulatory Visit (INDEPENDENT_AMBULATORY_CARE_PROVIDER_SITE_OTHER): Payer: Medicare Other | Admitting: Family Medicine

## 2010-10-11 VITALS — BP 106/70 | HR 74 | Temp 98.3°F | Wt 151.8 lb

## 2010-10-11 DIAGNOSIS — E119 Type 2 diabetes mellitus without complications: Secondary | ICD-10-CM

## 2010-10-11 DIAGNOSIS — I10 Essential (primary) hypertension: Secondary | ICD-10-CM

## 2010-10-11 DIAGNOSIS — F329 Major depressive disorder, single episode, unspecified: Secondary | ICD-10-CM

## 2010-10-11 DIAGNOSIS — E785 Hyperlipidemia, unspecified: Secondary | ICD-10-CM

## 2010-10-11 DIAGNOSIS — Z23 Encounter for immunization: Secondary | ICD-10-CM

## 2010-10-11 DIAGNOSIS — Z780 Asymptomatic menopausal state: Secondary | ICD-10-CM

## 2010-10-11 DIAGNOSIS — Z0000 Encounter for general adult medical examination without abnormal findings: Secondary | ICD-10-CM

## 2010-10-11 LAB — HM DIABETES FOOT EXAM

## 2010-10-11 MED ORDER — FLUOXETINE HCL 40 MG PO CAPS: 40.0000 mg | ORAL_CAPSULE | Freq: Every day | ORAL | Status: DC

## 2010-10-11 MED ORDER — ATORVASTATIN CALCIUM 80 MG PO TABS: 80.0000 mg | ORAL_TABLET | Freq: Every day | ORAL | Status: DC

## 2010-10-11 NOTE — Assessment & Plan Note (Signed)
Due to cost.. Stop vytorin and change to lipitor 80 mg daily.

## 2010-10-11 NOTE — Patient Instructions (Addendum)
Increase prozac to 40 mg daily. Call if not improving after 1 month. Don't forget yearly eye exam. Stop vytorin, change to atorvastatin 80 mg daily.  Return for 6 month follow up in 6 months with fasting labs prior, A1C.

## 2010-10-11 NOTE — Assessment & Plan Note (Signed)
Improved but still room for improvement.. change to 40 mg daily prozac. Call if not improving as expected.

## 2010-10-11 NOTE — Assessment & Plan Note (Signed)
Well controlled. Continue current medication.  

## 2010-10-11 NOTE — Progress Notes (Signed)
Subjective:    Patient ID: Crystal Newton, female    DOB: [birth year], 65 y.o.   MRN: [id number]  HPI I have personally reviewed the Medicare Annual Wellness questionnaire and have noted 1. The patient's medical and social history 2. Their use of alcohol, tobacco or illicit drugs 3. Their current medications and supplements 4. The patient's functional ability including ADL's, fall risks, home safety risks and hearing or visual             impairment. 5. Diet and physical activities 6. Evidence for depression or mood disorders The patients weight, height, BMI and visual acuity have been recorded in the chart I have made referrals, counseling and provided education to the patient based review of the above and I have provided the pt with a written personalized care plan for preventive services.  Recent severe bronchtitis.Marland Kitchen Resolved with 3 courses of antibiotics. Back to baseline.  Hypertension:  Well controlled on lisinopril  and HCTZ  Using medication without problems or lightheadedness: Yes Chest pain with exertion:None Edema:None Short of breath:None Average home GNF:[id number]..  Nml range at home. Other issues:  Diabetes: Controlled with diet and lifestyle , on no medication. Hypoglycemic episodes:None Hyperglycemic episodes:None Feet problems:None Blood Sugars averaging:checking daily... FBS 105 average eye exam within last year:08/2009 She has had weight loss of 10-20 lbs in last few months. HAs been trying to lose weight.  Elevated Cholesterol: At goal LDL <100 on vytorin Using medications without problems:Yes.. Interested in cahnging med to be cheaper adn to try to lessen medication. Muscle aches: None  Depression: Improved control on prozac, sleeping better at night.  Still having some irritable and edgy later in the day.  Has not had colonoscopy yuet.. Missed it last year.. She is going to schedule this with GI in 11/2010.    Review of Systems  Constitutional: Negative  for fever and fatigue.  HENT: Negative for ear pain.   Eyes: Negative for pain.  Respiratory: Negative for chest tightness and shortness of breath.   Cardiovascular: Negative for chest pain, palpitations and leg swelling.  Gastrointestinal: Negative for abdominal pain.  Genitourinary: Negative for dysuria.  Musculoskeletal:       Left posterior calf hurts on long car rides, no rash, no swelling, pain gone when standing or moving.       Objective:   Physical Exam  Constitutional: Vital signs are normal. She appears well-developed and well-nourished. She is cooperative.  Non-toxic appearance. She does not appear ill. No distress.  HENT:  Head: Normocephalic.  Right Ear: Hearing, tympanic membrane, external ear and ear canal normal.  Left Ear: Hearing, tympanic membrane, external ear and ear canal normal.  Nose: Nose normal.  Eyes: Conjunctivae, EOM and lids are normal. Pupils are equal, round, and reactive to light. No foreign bodies found.  Neck: Trachea normal and normal range of motion. Neck supple. Carotid bruit is not present. No mass and no thyromegaly present.  Cardiovascular: Normal rate, regular rhythm, S1 normal, S2 normal, normal heart sounds and intact distal pulses.  Exam reveals no gallop.   No murmur heard. Pulmonary/Chest: Effort normal and breath sounds normal. No respiratory distress. She has no wheezes. She has no rhonchi. She has no rales.  Abdominal: Soft. Normal appearance and bowel sounds are normal. She exhibits no distension, no fluid wave, no abdominal bruit and no mass. There is no hepatosplenomegaly. There is no tenderness. There is no rebound, no guarding and no CVA tenderness. No hernia.  Genitourinary: Rectum normal. No  breast swelling, tenderness, discharge or bleeding. Pelvic exam was performed with patient prone. There is no rash, tenderness, lesion or injury on the right labia. There is no rash, tenderness, lesion or injury on the left labia. Right adnexum  displays no mass, no tenderness and no fullness. Left adnexum displays no mass, no tenderness and no fullness. No erythema, tenderness or bleeding around the vagina. No foreign body around the vagina. No signs of injury around the vagina. No vaginal discharge found.  Lymphadenopathy:    She has no cervical adenopathy.    She has no axillary adenopathy.  Neurological: She is alert. She has normal strength. No cranial nerve deficit or sensory deficit.  Skin: Skin is warm, dry and intact. No rash noted.       Sebaceous cyst vs cholesterol deposit  right cheek, no infection.  Psychiatric: Her speech is normal and behavior is normal. Judgment normal. Her mood appears not anxious. Cognition and memory are normal. She does not exhibit a depressed mood.      Diabetic foot exam: Normal inspection No skin breakdown No calluses  Normal DP pulses Normal sensation to light touch and monofilament Nails normal     Assessment & Plan:

## 2010-10-11 NOTE — Assessment & Plan Note (Signed)
Encouraged exercise, weight loss, healthy eating habits. Well controlled. Continue current medication.  

## 2011-01-11 ENCOUNTER — Other Ambulatory Visit (INDEPENDENT_AMBULATORY_CARE_PROVIDER_SITE_OTHER): Payer: Medicare Other

## 2011-01-11 DIAGNOSIS — E785 Hyperlipidemia, unspecified: Secondary | ICD-10-CM

## 2011-01-11 LAB — LIPID PANEL
HDL: 59.3 mg/dL (ref >39.00)
Total CHOL/HDL Ratio: 3
Triglycerides: 68 mg/dL (ref 0.0–149.0)

## 2011-01-13 ENCOUNTER — Ambulatory Visit (INDEPENDENT_AMBULATORY_CARE_PROVIDER_SITE_OTHER): Payer: Medicare Other

## 2011-01-13 DIAGNOSIS — Z23 Encounter for immunization: Secondary | ICD-10-CM

## 2011-01-18 LAB — URINALYSIS, ROUTINE W REFLEX MICROSCOPIC
Glucose, UA: NEGATIVE
Ketones, ur: 15 — AB
Nitrite: POSITIVE — AB
Specific Gravity, Urine: 1.025
pH: 6

## 2011-01-18 LAB — CBC
HCT: 41.8
MCHC: 33.5
MCV: 94.1
Platelets: 236
WBC: 13.2 — ABNORMAL HIGH

## 2011-01-18 LAB — POCT I-STAT, CHEM 8
Creatinine, Ser: 0.7
Glucose, Bld: 140 — ABNORMAL HIGH
HCT: 43
Hemoglobin: 14.6
Sodium: 140
TCO2: 23

## 2011-01-18 LAB — DIFFERENTIAL
Basophils Relative: 0
Eosinophils Absolute: 0
Eosinophils Relative: 0
Lymphs Abs: 1.1
Neutrophils Relative %: 88 — ABNORMAL HIGH

## 2011-01-18 LAB — URINE MICROSCOPIC-ADD ON

## 2011-01-19 ENCOUNTER — Ambulatory Visit (INDEPENDENT_AMBULATORY_CARE_PROVIDER_SITE_OTHER): Payer: Medicare Other | Admitting: Family Medicine

## 2011-01-19 ENCOUNTER — Encounter: Payer: Self-pay | Admitting: Family Medicine

## 2011-01-19 VITALS — BP 110/74 | HR 72 | Temp 98.3°F | Wt 165.8 lb

## 2011-01-19 DIAGNOSIS — N390 Urinary tract infection, site not specified: Secondary | ICD-10-CM

## 2011-01-19 DIAGNOSIS — R300 Dysuria: Secondary | ICD-10-CM

## 2011-01-19 LAB — POCT URINALYSIS DIPSTICK
Nitrite, UA: POSITIVE
Spec Grav, UA: 1.025
Urobilinogen, UA: 0.2

## 2011-01-19 MED ORDER — CIPROFLOXACIN HCL 500 MG PO TABS: 500.0000 mg | ORAL_TABLET | Freq: Two times a day (BID) | ORAL | Status: AC

## 2011-01-19 MED ORDER — PHENAZOPYRIDINE HCL 200 MG PO TABS: 200.0000 mg | ORAL_TABLET | Freq: Three times a day (TID) | ORAL | Status: DC | PRN

## 2011-01-19 NOTE — Patient Instructions (Signed)
Take Cipro twice a day for ten days. Take pyridium three times a day for 2-3 days. Call if sxs don't resolve.

## 2011-01-19 NOTE — Assessment & Plan Note (Addendum)
See instructions. Micro 2-4 WBCs, no RBCs and few Epis, signif odor associated, urine clear grossly.

## 2011-01-19 NOTE — Progress Notes (Signed)
  Subjective:    Patient ID: Crystal Newton, female    DOB: 1945/08/21, 65 y.o.   MRN: [id number]  HPI Pt of Dr Ermalene Searing here as acute appt for urinary sxs. She has had need to go but then dribbles and has frequency which has been two days. She has burning and pain with irritation when urinating. The irritation is now all the time. She has had urinary infections in the past, 2-3 in her entire life, one associated with a kidney stone. She denies fever but has had chills. She has not taken any medication, OTC or otherwise.    Review of SystemsNoncontributory except as above.       Objective:   Physical Exam  Constitutional: She appears well-developed and well-nourished. No distress.       Nontoxic.  HENT:  Head: Normocephalic and atraumatic.  Right Ear: External ear normal.  Left Ear: External ear normal.  Nose: Nose normal.  Mouth/Throat: Oropharynx is clear and moist. No oropharyngeal exudate.  Eyes: Conjunctivae and EOM are normal. Pupils are equal, round, and reactive to light.  Neck: Normal range of motion. Neck supple. No thyromegaly present.  Cardiovascular: Normal rate, regular rhythm and normal heart sounds.   Pulmonary/Chest: Effort normal and breath sounds normal. She has no wheezes. She has no rales.  Abdominal:       Mild suprapubic discomfort with palpation.  Genitourinary:       No CVAT.  Lymphadenopathy:    She has no cervical adenopathy.  Skin: She is not diaphoretic.          Assessment & Plan:

## 2011-04-09 ENCOUNTER — Other Ambulatory Visit: Payer: Self-pay | Admitting: Family Medicine

## 2011-04-11 NOTE — Telephone Encounter (Signed)
OK to refill

## 2011-04-14 ENCOUNTER — Other Ambulatory Visit (INDEPENDENT_AMBULATORY_CARE_PROVIDER_SITE_OTHER): Payer: Medicare Other

## 2011-04-14 ENCOUNTER — Other Ambulatory Visit: Payer: Medicare Other

## 2011-04-14 DIAGNOSIS — E119 Type 2 diabetes mellitus without complications: Secondary | ICD-10-CM

## 2011-04-20 ENCOUNTER — Other Ambulatory Visit: Payer: Self-pay | Admitting: Family Medicine

## 2011-04-21 ENCOUNTER — Ambulatory Visit: Payer: Medicare Other | Admitting: Family Medicine

## 2011-05-13 ENCOUNTER — Ambulatory Visit: Payer: Medicare Other | Admitting: Family Medicine

## 2011-06-02 ENCOUNTER — Ambulatory Visit (INDEPENDENT_AMBULATORY_CARE_PROVIDER_SITE_OTHER): Payer: Medicare Other | Admitting: Family Medicine

## 2011-06-02 ENCOUNTER — Encounter: Payer: Self-pay | Admitting: Family Medicine

## 2011-06-02 DIAGNOSIS — E119 Type 2 diabetes mellitus without complications: Secondary | ICD-10-CM

## 2011-06-02 DIAGNOSIS — F329 Major depressive disorder, single episode, unspecified: Secondary | ICD-10-CM

## 2011-06-02 DIAGNOSIS — E785 Hyperlipidemia, unspecified: Secondary | ICD-10-CM

## 2011-06-02 DIAGNOSIS — I10 Essential (primary) hypertension: Secondary | ICD-10-CM

## 2011-06-02 NOTE — Progress Notes (Signed)
  Patient Name: Crystal Newton Date of Birth: 04-Mar-1946 Age: 66 y.o. Medical Record Number: [id number] Gender: female Date of Encounter: 06/02/2011  History of Present Illness:  Crystal Newton is a 66 y.o. very pleasant female patient who presents with the following:  F/u DM:  Diabetes Mellitus: Tolerating Medications: diet controlled now Compliance with diet: good Exercise: +/- Avg blood sugars at home: normal Foot problems: none Hypoglycemia: none No nausea, vomitting, blurred vision, polyuria.  Lab Results  Component Value Date   HGBA1C 5.5 04/14/2011    Wt Readings from Last 3 Encounters:  06/02/11 162 lb 12.8 oz (73.846 kg)  01/19/11 165 lb 12 oz (75.184 kg)  10/11/10 151 lb 12.8 oz (68.856 kg)    Body mass index is 30.76 kg/(m^2).  Lipids: Doing well, stable. Tolerating meds fine with no SE. Panel reviewed with patient.  Lipids:    Component Value Date/Time   CHOL 165 01/11/2011 0926   TRIG 68.0 01/11/2011 0926   HDL 59.30 01/11/2011 0926   VLDL 13.6 01/11/2011 0926   CHOLHDL 3 01/11/2011 0926    Lab Results  Component Value Date   ALT 20 10/06/2010   AST 20 10/06/2010   ALKPHOS 74 10/06/2010   BILITOT 0.8 10/06/2010   HTN: Tolerating all medications without side effects Stable and at goal No CP, no sob. No HA.  BP Readings from Last 3 Encounters:  06/02/11 110/60  01/19/11 110/74  10/11/10 106/70    Basic Metabolic Panel:    Component Value Date/Time   NA 139 10/06/2010 0810   K 4.4 10/06/2010 0810   CL 102 10/06/2010 0810   CO2 29 10/06/2010 0810   BUN 18 10/06/2010 0810   CREATININE 0.5 10/06/2010 0810   GLUCOSE 98 10/06/2010 0810   CALCIUM 9.7 10/06/2010 0810   anx / dep: doing well  Past Medical History, Surgical History, Social History, Family History, Problem List, Medications, and Allergies have been reviewed and updated if relevant.  Review of Systems:  GEN: No acute illnesses, no fevers, chills. GI: No n/v/d, eating normally Pulm: No  SOB Interactive and getting along well at home.  Otherwise, ROS is as per the HPI.   Physical Examination: Filed Vitals:   06/02/11 0936  BP: 110/60  Pulse: 71  Temp: 97.8 F (36.6 C)  TempSrc: Oral  Height: 5\' 1"  (1.549 m)  Weight: 162 lb 12.8 oz (73.846 kg)  SpO2: 98%    Body mass index is 30.76 kg/(m^2).   GEN: WDWN, NAD, Non-toxic, A & O x 3 HEENT: Atraumatic, Normocephalic. Neck supple. No masses, No LAD. Ears and Nose: No external deformity. CV: RRR, No M/G/R. No JVD. No thrill. No extra heart sounds. PULM: CTA B, no wheezes, crackles, rhonchi. No retractions. No resp. distress. No accessory muscle use. EXTR: No c/c/e NEURO Normal gait.  PSYCH: Normally interactive. Conversant. Not depressed or anxious appearing.  Calm demeanor.   Assessment and Plan: 1. DIABETES MELLITUS, TYPE II   2. HYPERLIPIDEMIA   3. HYPERTENSION   4. DEPRESSION     All stable. Cont current POC. No med changes, f/u 6 mo

## 2011-06-06 ENCOUNTER — Other Ambulatory Visit: Payer: Self-pay | Admitting: Family Medicine

## 2011-09-14 ENCOUNTER — Other Ambulatory Visit: Payer: Self-pay | Admitting: Family Medicine

## 2011-09-14 DIAGNOSIS — R928 Other abnormal and inconclusive findings on diagnostic imaging of breast: Secondary | ICD-10-CM

## 2011-09-15 ENCOUNTER — Encounter: Payer: Self-pay | Admitting: Family Medicine

## 2011-09-19 ENCOUNTER — Encounter: Payer: Self-pay | Admitting: *Deleted

## 2011-09-30 ENCOUNTER — Other Ambulatory Visit: Payer: Self-pay | Admitting: Family Medicine

## 2011-10-06 ENCOUNTER — Telehealth: Payer: Self-pay | Admitting: Family Medicine

## 2011-10-06 NOTE — Telephone Encounter (Signed)
Patients husband dropped off jury summons along with a letter to you asking to be excused from jury duty.  Please call patient with your decision.  Jury summons & letter is in your in box.

## 2011-10-06 NOTE — Telephone Encounter (Signed)
Will write letter. Let pt know, may be next week before ready.

## 2011-10-07 ENCOUNTER — Encounter: Payer: Self-pay | Admitting: Family Medicine

## 2011-10-07 NOTE — Telephone Encounter (Signed)
Patient advised letter ready for pick up 

## 2011-10-09 ENCOUNTER — Other Ambulatory Visit: Payer: Self-pay | Admitting: Family Medicine

## 2011-12-08 ENCOUNTER — Other Ambulatory Visit (INDEPENDENT_AMBULATORY_CARE_PROVIDER_SITE_OTHER): Payer: Medicare Other

## 2011-12-08 DIAGNOSIS — E119 Type 2 diabetes mellitus without complications: Secondary | ICD-10-CM

## 2011-12-08 DIAGNOSIS — E7800 Pure hypercholesterolemia, unspecified: Secondary | ICD-10-CM

## 2011-12-08 DIAGNOSIS — I10 Essential (primary) hypertension: Secondary | ICD-10-CM

## 2011-12-08 LAB — CBC WITH DIFFERENTIAL/PLATELET
Basophils Relative: 0.3 % (ref 0.0–3.0)
Eosinophils Absolute: 0.1 10*3/uL (ref 0.0–0.7)
HCT: 40.7 % (ref 36.0–46.0)
Hemoglobin: 13.3 g/dL (ref 12.0–15.0)
Lymphocytes Relative: 25.4 % (ref 12.0–46.0)
Lymphs Abs: 1.7 10*3/uL (ref 0.7–4.0)
MCHC: 32.7 g/dL (ref 30.0–36.0)
MCV: 95.3 fl (ref 78.0–100.0)
Neutro Abs: 4.3 10*3/uL (ref 1.4–7.7)
RBC: 4.27 Mil/uL (ref 3.87–5.11)

## 2011-12-08 LAB — COMPREHENSIVE METABOLIC PANEL
ALT: 22 U/L (ref 0–35)
AST: 21 U/L (ref 0–37)
Alkaline Phosphatase: 93 U/L (ref 39–117)
Creatinine, Ser: 0.6 mg/dL (ref 0.4–1.2)
Total Bilirubin: 0.8 mg/dL (ref 0.3–1.2)

## 2011-12-08 LAB — HEMOGLOBIN A1C: Hgb A1c MFr Bld: 5.5 % (ref 4.6–6.5)

## 2011-12-08 LAB — LIPID PANEL
HDL: 53.5 mg/dL (ref >39.00)
Total CHOL/HDL Ratio: 3
VLDL: 12.4 mg/dL (ref 0.0–40.0)

## 2011-12-08 LAB — MICROALBUMIN / CREATININE URINE RATIO: Microalb Creat Ratio: 1.1 mg/g (ref 0.0–30.0)

## 2011-12-09 ENCOUNTER — Telehealth: Payer: Self-pay | Admitting: Family Medicine

## 2011-12-09 ENCOUNTER — Other Ambulatory Visit: Payer: Medicare Other

## 2011-12-09 DIAGNOSIS — E119 Type 2 diabetes mellitus without complications: Secondary | ICD-10-CM

## 2011-12-09 DIAGNOSIS — E785 Hyperlipidemia, unspecified: Secondary | ICD-10-CM

## 2011-12-09 NOTE — Telephone Encounter (Signed)
Message copied by Excell Seltzer on Fri Dec 09, 2011  8:11 AM ------      Message from: Alvina Chou      Created: Tue Dec 06, 2011 10:05 AM      Regarding: lab orders for Friday, 8.23       Patient is scheduled for CPX labs, please order future labs, Thanks , Camelia Eng

## 2011-12-09 NOTE — Telephone Encounter (Signed)
Labs already done on 12/08/11.

## 2011-12-09 NOTE — Telephone Encounter (Signed)
Message copied by Sharise Lippy E on Fri Dec 09, 2011  8:11 AM ------      Message from: WALSH, TERRI J      Created: Tue Dec 06, 2011 10:05 AM      Regarding: lab orders for Friday, 8.23       Patient is scheduled for CPX labs, please order future labs, Thanks , Terri       

## 2011-12-13 ENCOUNTER — Ambulatory Visit (INDEPENDENT_AMBULATORY_CARE_PROVIDER_SITE_OTHER): Payer: Medicare Other | Admitting: Family Medicine

## 2011-12-13 ENCOUNTER — Encounter: Payer: Self-pay | Admitting: Family Medicine

## 2011-12-13 VITALS — BP 108/70 | HR 68 | Temp 98.6°F | Ht 60.75 in | Wt 161.0 lb

## 2011-12-13 DIAGNOSIS — E119 Type 2 diabetes mellitus without complications: Secondary | ICD-10-CM

## 2011-12-13 DIAGNOSIS — I10 Essential (primary) hypertension: Secondary | ICD-10-CM

## 2011-12-13 DIAGNOSIS — Z0000 Encounter for general adult medical examination without abnormal findings: Secondary | ICD-10-CM

## 2011-12-13 DIAGNOSIS — E785 Hyperlipidemia, unspecified: Secondary | ICD-10-CM

## 2011-12-13 MED ORDER — ESOMEPRAZOLE MAGNESIUM 40 MG PO CPDR: 40.0000 mg | DELAYED_RELEASE_CAPSULE | Freq: Every day | ORAL | Status: DC

## 2011-12-13 NOTE — Patient Instructions (Addendum)
Work on getting back on track with exercise and weight loss. Schedule colonoscopy with your GI. Discuss with family living will and health Care power of attorney.

## 2011-12-13 NOTE — Progress Notes (Signed)
I have personally reviewed the Medicare Annual Wellness questionnaire and have noted  1. The patient's medical and social history  2. Their use of alcohol, tobacco or illicit drugs  3. Their current medications and supplements  4. The patient's functional ability including ADL's, fall risks, home safety risks and hearing or visual  impairment.  5. Diet and physical activities  6. Evidence for depression or mood disorders  The patients weight, height, BMI and visual acuity have been recorded in the chart  I have made referrals, counseling and provided education to the patient based review of the above and I have provided the pt with a written personalized care plan for preventive services.   Hypertension: Well controlled on lisinopril and HCTZ  Using medication without problems or lightheadedness: Yes  Chest pain with exertion:None  Edema:None  Short of breath:None  Average home BPs: rarely.. Nml range at home.  Other issues:   Diabetes: Controlled with diet and lifestyle , on no medication.  Lab Results  Component Value Date   HGBA1C 5.5 12/08/2011  Hypoglycemic episodes:None  Hyperglycemic episodes:None  Feet problems:None  Blood Sugars averaging:checking daily... FBS 98-104 average  eye exam within last year:08/2009  Wt Readings from Last 3 Encounters:  12/13/11 161 lb (73.029 kg)  06/02/11 162 lb 12.8 oz (73.846 kg)  01/19/11 165 lb 12 oz (75.184 kg)    Elevated Cholesterol: At goal LDL <100 on lipitor 80 mg Lab Results  Component Value Date   CHOL 150 12/08/2011   HDL 53.50 12/08/2011   LDLCALC 84 12/08/2011   TRIG 62.0 12/08/2011   CHOLHDL 3 12/08/2011  Using medications without problems: None Muscle aches: None  Exercise: NONE Diet: healthy, low carb.   Depression: Improved control on prozac, sleeping better at night.  Anxiety well controlled. No SI, o HI. Staying active.  Review of Systems  Constitutional: Negative for fever, but does have some fatigue (keeping 4  grandchildren) HENT: Negative for ear pain.  Eyes: Negative for pain.  Respiratory: Negative for chest tightness and shortness of breath.  Cardiovascular: Negative for chest pain, palpitations and leg swelling.  Gastrointestinal: Negative for abdominal pain.  No D/C Genitourinary: Negative for dysuria.   Objective:   Physical Exam  Constitutional: Vital signs are normal. She appears well-developed and well-nourished. She is cooperative. Non-toxic appearance. She does not appear ill. No distress.  HENT:  Head: Normocephalic.  Right Ear: Hearing, tympanic membrane, external ear and ear canal normal.  Left Ear: Hearing, tympanic membrane, external ear and ear canal normal.  Nose: Nose normal.  Eyes: Conjunctivae, EOM and lids are normal. Pupils are equal, round, and reactive to light. No foreign bodies found.  Neck: Trachea normal and normal range of motion. Neck supple. Carotid bruit is not present. No mass and no thyromegaly present.  Cardiovascular: Normal rate, regular rhythm, S1 normal, S2 normal, normal heart sounds and intact distal pulses. Exam reveals no gallop.  No murmur heard.  Pulmonary/Chest: Effort normal and breath sounds normal. No respiratory distress. She has no wheezes. She has no rhonchi. She has no rales.  Abdominal: Soft. Normal appearance and bowel sounds are normal. She exhibits no distension, no fluid wave, no abdominal bruit and no mass. There is no hepatosplenomegaly. There is no tenderness. There is no rebound, no guarding and no CVA tenderness. No hernia.  Genitourinary: Not performed this year. Lymphadenopathy:  She has no cervical adenopathy.  She has no axillary adenopathy.  Neurological: She is alert. She has normal strength. No  cranial nerve deficit or sensory deficit.  Skin: Skin is warm, dry and intact. No rash noted.  Psychiatric: Her speech is normal and behavior is normal. Judgment normal. Her mood appears not anxious. Cognition and memory are normal.  She does not exhibit a depressed mood.   Diabetic foot exam:  Normal inspection  No skin breakdown  No calluses  Normal DP pulses  Normal sensation to light touch and monofilament  Nails normal    ASSESSMENT AND PLAN:  The patient's preventative maintenance and recommended screening tests for an annual wellness exam were reviewed in full today. Brought up to date unless services declined.  Counselled on the importance of diet, exercise, and its role in overall health and mortality. The patient's FH and SH was reviewed, including their home life, tobacco status, and drug and alcohol status.   Vaccines:Uptodate with TD and PNA,  shingles vaccine not covered by insurance.  Colon:  Last colonoscopy 2002 Dr. Eliberto Ivory in [place], [place]. Due in 2012. Has not had colonoscopy yet.. Missed it last year again. She is going to schedule this with GI this next year.  DVE/pap: partial hysterectomy, has both ovaries. Asymptomatic, no family history. Plan DVE every 2 years, not performed this year. Mammo: stable 09/2011  DEXA: Wishes to hold off at this point.

## 2012-02-23 ENCOUNTER — Ambulatory Visit (INDEPENDENT_AMBULATORY_CARE_PROVIDER_SITE_OTHER): Payer: Medicare Other

## 2012-02-23 DIAGNOSIS — Z23 Encounter for immunization: Secondary | ICD-10-CM

## 2012-04-09 ENCOUNTER — Other Ambulatory Visit: Payer: Self-pay

## 2012-04-09 MED ORDER — FLUOXETINE HCL 40 MG PO CAPS: 40.0000 mg | ORAL_CAPSULE | Freq: Every day | ORAL | Status: DC

## 2012-04-09 MED ORDER — HYDROCHLOROTHIAZIDE 25 MG PO TABS: 25.0000 mg | ORAL_TABLET | Freq: Every day | ORAL | Status: DC

## 2012-04-09 NOTE — Telephone Encounter (Signed)
pts husband left note requesting refill HCTZ and fluoxetine to [facility]. Refills done and Crystal Newton notified.

## 2012-05-16 ENCOUNTER — Other Ambulatory Visit: Payer: Self-pay | Admitting: *Deleted

## 2012-05-16 MED ORDER — LISINOPRIL 5 MG PO TABS: 5.0000 mg | ORAL_TABLET | Freq: Every day | ORAL | Status: DC

## 2012-06-05 ENCOUNTER — Telehealth: Payer: Self-pay | Admitting: Family Medicine

## 2012-06-05 DIAGNOSIS — E785 Hyperlipidemia, unspecified: Secondary | ICD-10-CM

## 2012-06-05 DIAGNOSIS — E119 Type 2 diabetes mellitus without complications: Secondary | ICD-10-CM

## 2012-06-05 DIAGNOSIS — I10 Essential (primary) hypertension: Secondary | ICD-10-CM

## 2012-06-05 NOTE — Telephone Encounter (Signed)
Message copied by Excell Seltzer on Tue Jun 05, 2012  1:57 PM ------      Message from: Alvina Chou      Created: Tue May 29, 2012  4:44 PM      Regarding: Lab orders for Wednesday, 2.19.14       Labs for 6 month f/u ------

## 2012-06-07 ENCOUNTER — Other Ambulatory Visit: Payer: Medicare Other

## 2012-06-08 ENCOUNTER — Other Ambulatory Visit (INDEPENDENT_AMBULATORY_CARE_PROVIDER_SITE_OTHER): Payer: Medicare Other

## 2012-06-08 DIAGNOSIS — E785 Hyperlipidemia, unspecified: Secondary | ICD-10-CM

## 2012-06-08 DIAGNOSIS — E119 Type 2 diabetes mellitus without complications: Secondary | ICD-10-CM

## 2012-06-08 LAB — LIPID PANEL
Cholesterol: 181 mg/dL (ref 0–200)
LDL Cholesterol: 98 mg/dL (ref 0–99)
Total CHOL/HDL Ratio: 3

## 2012-06-08 LAB — COMPREHENSIVE METABOLIC PANEL
AST: 25 U/L (ref 0–37)
Albumin: 4.5 g/dL (ref 3.5–5.2)
Alkaline Phosphatase: 82 U/L (ref 39–117)
Glucose, Bld: 98 mg/dL (ref 70–99)
Potassium: 4.3 mEq/L (ref 3.5–5.1)
Sodium: 138 mEq/L (ref 135–145)
Total Protein: 7 g/dL (ref 6.0–8.3)

## 2012-06-11 ENCOUNTER — Ambulatory Visit (INDEPENDENT_AMBULATORY_CARE_PROVIDER_SITE_OTHER): Payer: Medicare Other | Admitting: Family Medicine

## 2012-06-11 ENCOUNTER — Encounter: Payer: Self-pay | Admitting: Family Medicine

## 2012-06-11 VITALS — BP 120/82 | HR 80 | Temp 97.6°F | Ht 60.75 in | Wt 154.0 lb

## 2012-06-11 DIAGNOSIS — S060X0A Concussion without loss of consciousness, initial encounter: Secondary | ICD-10-CM

## 2012-06-11 DIAGNOSIS — S0093XA Contusion of unspecified part of head, initial encounter: Secondary | ICD-10-CM

## 2012-06-11 DIAGNOSIS — S0003XA Contusion of scalp, initial encounter: Secondary | ICD-10-CM

## 2012-06-11 DIAGNOSIS — S0990XA Unspecified injury of head, initial encounter: Secondary | ICD-10-CM

## 2012-06-11 NOTE — Progress Notes (Signed)
Nature conservation officer at [facility] [address] [place] [address] Phone: [phone] Fax: [phone]  Date:  06/11/2012   Name:  Crystal Newton   DOB:  November 01, 1945   MRN:  [id number] Gender: female Age: 67 y.o.  Primary Physician:  Kerby Nora, MD  Evaluating MD: Hannah Beat, MD   Chief Complaint: fell and hit head now has know and black eyes   History of Present Illness:  Crystal Newton is a 67 y.o. pleasant patient who presents with the following:  The patient reports that on Friday, 06/08/2012, after she had been at our office getting a blood draw, then she stumbled and struck her head on the concrete curb. The specifics around the incident are blurry, but she did break her glasses. She did not have a great deal of pain at the time of accident, so did not seek immediate medical attention. She is here today for evaluation, and history is also obtained with her son who is here.  She has had some extensive bruising on the forehead, face, and now some pooling around her eyelids.   She reports a Headache since the accident, and got nauseous this morning. Thinks that maybe she sees better now compared to prior to the accident. This morning her hands were shaking. She denies alteration of memory, balance disturbance, changes in mood or irritability or sleep pattern.  A full ACE concussion evaluation from Dr. Nadyne Coombes group at [facility] was completed and reviewed with her and her son. Effectively, at this point, the only positive symptoms are headache and nausea.   Patient Active Problem List  Diagnosis  . DIABETES MELLITUS, TYPE II  . HYPERLIPIDEMIA  . ANXIETY  . DEPRESSION  . COMMON MIGRAINE  . HYPERTENSION  . PUD  . OSTEOARTHROSIS UNSPEC WHETHER GEN/LOCALIZED HAND    Past Medical History  Diagnosis Date  . Diabetes mellitus   . Hyperlipidemia   . Hypertension   . Anxiety   . Depression     Past Surgical History  Procedure Laterality Date  . Partial  colectomy  1996    ruptured abcess for diverticulitis  . Partial hysterectomy      uterine prolapse  . Right parotid    . Breast biopsy    . Abdominal hysterectomy      History   Social History  . Marital Status: Married    Spouse Name: N/A    Number of Children: N/A  . Years of Education: N/A   Occupational History  . day care    Social History Main Topics  . Smoking status: Never Smoker   . Smokeless tobacco: Never Used  . Alcohol Use: No  . Drug Use: No  . Sexually Active: Not on file   Other Topics Concern  . Not on file   Social History Narrative   Regular exercise--no, but walking on weekends.      Diet-- healthy, low carb, working on weight loss   No living will. Full Code. (Reviewed 2013)    Family History  Problem Relation Age of Onset  . Alzheimer's disease Mother   . Diabetes Maternal Grandfather   . Coronary artery disease Maternal Grandfather     No Known Allergies  Medication list has been reviewed and updated.  Outpatient Prescriptions Prior to Visit  Medication Sig Dispense Refill  . aspirin 81 MG tablet Take 81 mg by mouth daily.        Marland Kitchen atorvastatin (LIPITOR) 80 MG tablet TAKE  1 TABLET (80 MG TOTAL) BY MOUTH DAILY.  30 tablet  11  . Blood Glucose Monitoring Suppl (BAYER BREEZE 2 SYSTEM) W/DEVICE KIT by Does not apply route.        Marland Kitchen esomeprazole (NEXIUM) 40 MG capsule Take 1 capsule (40 mg total) by mouth daily.  30 capsule  11  . FLUoxetine (PROZAC) 40 MG capsule Take 1 capsule (40 mg total) by mouth daily.  30 capsule  6  . glucosamine-chondroitin 500-400 MG tablet Take 1 tablet by mouth 3 (three) times daily.        . hydrochlorothiazide (HYDRODIURIL) 25 MG tablet Take 1 tablet (25 mg total) by mouth daily.  30 tablet  6  . loratadine (CLARITIN) 10 MG tablet Take 10 mg by mouth daily.      . Multiple Vitamin (MULTIVITAMIN) tablet Take 1 tablet by mouth daily.      Marland Kitchen lisinopril (PRINIVIL,ZESTRIL) 5 MG tablet Take 1 tablet (5 mg total) by  mouth daily.  30 tablet  6   No facility-administered medications prior to visit.    Review of Systems:  As above. No chest pain, pain, no shortness of breath. Facial and head pain and bruising.  Physical Examination: BP 120/82  Pulse 80  Temp(Src) 97.6 F (36.4 C) (Oral)  Ht 5' 0.75" (1.543 m)  Wt 154 lb (69.854 kg)  BMI 29.34 kg/m2  SpO2 98%  Ideal Body Weight: Weight in (lb) to have BMI = 25: 131   GEN: WDWN, NAD, Non-toxic, A & O x 3 HEENT: Atraumatic, Normocephalic. Neck supple. No masses, No LAD. Ears and Nose: No external deformity. CV: RRR, No M/G/R. No JVD. No thrill. No extra heart sounds. PULM: CTA B, no wheezes, crackles, rhonchi. No retractions. No resp. distress. No accessory muscle use. ABD: S, NT, ND, +BS. No rebound tenderness. No HSM.  EXTR: No c/c/e MSK: tender to palpation along superior frontal skull with no step off and no orbital step-offs. Full ROM with eyes.  SKIN: diffuse bruising, more bruising and puffiness in the eyelids that have appearance of pooled blood.  Neuro: CN 2-12 grossly intact. PERRLA. EOMI. Sensation intact throughout. Str 5/5 all extremities. DTR 2+. No clonus. A and o x 4. Romberg neg. Finger nose neg. Walks heel to toe appropriately.   PSYCH: Normally interactive. Conversant. Not depressed or anxious appearing.  Calm demeanor.    Assessment and Plan:  Concussion without loss of consciousness, initial encounter  Head trauma, initial encounter  Contusion of head, initial encounter  >25 minutes spent in face to face time with patient, >50% spent in counselling or coordination of care: exam is reassuring. I do think she has a concussion and recommended absolute physical and cognitive rest, no reading, no watching TV, no driving. She is having minimal symptoms now, but she is having some symptoms, so needs rest until asymptomatic. Primarily sleeping over the next few days, cooking would be ok. Ice, tylenol, and motrin for her face.     Recheck on Thursday with Dr. Ermalene Searing.   Orders Today:  No orders of the defined types were placed in this encounter.    Updated Medication List: (Includes new medications, updates to list, dose adjustments) No orders of the defined types were placed in this encounter.    Medications Discontinued: There are no discontinued medications.    Signed, Elpidio Galea. Gertrude Bucks, MD 06/11/2012 2:36 PM

## 2012-06-12 DIAGNOSIS — S060X0A Concussion without loss of consciousness, initial encounter: Secondary | ICD-10-CM | POA: Insufficient documentation

## 2012-06-14 ENCOUNTER — Encounter: Payer: Self-pay | Admitting: Family Medicine

## 2012-06-14 ENCOUNTER — Ambulatory Visit (INDEPENDENT_AMBULATORY_CARE_PROVIDER_SITE_OTHER): Payer: Medicare Other | Admitting: Family Medicine

## 2012-06-14 VITALS — BP 120/72 | HR 77 | Temp 98.0°F | Ht 60.75 in | Wt 153.0 lb

## 2012-06-14 DIAGNOSIS — M79601 Pain in right arm: Secondary | ICD-10-CM | POA: Insufficient documentation

## 2012-06-14 DIAGNOSIS — I10 Essential (primary) hypertension: Secondary | ICD-10-CM

## 2012-06-14 DIAGNOSIS — E119 Type 2 diabetes mellitus without complications: Secondary | ICD-10-CM

## 2012-06-14 DIAGNOSIS — M79609 Pain in unspecified limb: Secondary | ICD-10-CM

## 2012-06-14 DIAGNOSIS — F0391 Unspecified dementia with behavioral disturbance: Secondary | ICD-10-CM | POA: Insufficient documentation

## 2012-06-14 DIAGNOSIS — M79602 Pain in left arm: Secondary | ICD-10-CM

## 2012-06-14 DIAGNOSIS — R413 Other amnesia: Secondary | ICD-10-CM

## 2012-06-14 DIAGNOSIS — E785 Hyperlipidemia, unspecified: Secondary | ICD-10-CM

## 2012-06-14 LAB — VITAMIN B12: Vitamin B-12: 439 pg/mL (ref 211–911)

## 2012-06-14 LAB — HM DIABETES FOOT EXAM

## 2012-06-14 NOTE — Patient Instructions (Addendum)
Stop at front desk on way out for blood tests. Continue mental and physical rest until headaches/any symptoms gone completely then start exercsie slowly.. Stop if headhaches return. Start regular exercise once concussion resolved.  Hold the lipitor for 1 week.. Call to let me know if muscle pain resolved completely. Schedule CPX with fasting labs prior in 6 months.

## 2012-06-14 NOTE — Assessment & Plan Note (Signed)
At goal on no medication. 

## 2012-06-14 NOTE — Assessment & Plan Note (Addendum)
Will eval with labs. She did have recent concussion.  MMSE today: 29/30  Animal recall 12 in 1 min, repeated goat.  Likely need to repeat testing once concusison fully resolved.  Consider MRI for further eval and possible referral to neuro.

## 2012-06-14 NOTE — Assessment & Plan Note (Signed)
No clear sign of rotator cuff or shoulder pathology. Most liekly due to statins... Will check CK. Hold lipitor.

## 2012-06-14 NOTE — Assessment & Plan Note (Signed)
Well controlled. Continue current medication.  

## 2012-06-14 NOTE — Progress Notes (Signed)
Subjective:    Patient ID: Crystal Newton, female    DOB: [birth year], 67 y.o.   MRN: [id number]  HPI  67 year old female presents for 6 month follow up.  She fell on 2/24.Marland Kitchen Hit head. She has been on mental and physical rest for concussion.  Headaches resolved now. Did not sleep well last night.  No change in mental status  She has noted some memory changes gradually in last 1-2 years, worse in last 6 months. Short term recall is the main issue. Has to have husband repeat himself, or she repeats herself. Remote memory is fine. Not getting lost, etc. Mother with alzheimer's. Nml TSH and cbc in 11/2011  Hypertension: Well controlled on lisinopril and HCTZ  Using medication without problems or lightheadedness: Yes  Chest pain with exertion:None  Edema:None  Short of breath:None  Average home BPs: rarely.. Nml range at home.  Other issues:   Diabetes: Controlled with diet and lifestyle, on no medication.  Lab Results  Component Value Date   HGBA1C 5.6 06/08/2012  Hyperglycemic episodes:None  Feet problems:None  Blood Sugars averaging:checking daily... FBS 97-110 average  eye exam within last year: due    Wt Readings from Last 3 Encounters:  06/14/12 153 lb (69.4 kg)  06/11/12 154 lb (69.854 kg)  12/13/11 161 lb (73.029 kg)     Elevated Cholesterol: At goal LDL <100 on lipitor 80 mg  Lab Results  Component Value Date   CHOL 181 06/08/2012   HDL 70.00 06/08/2012   LDLCALC 98 06/08/2012   TRIG 63.0 06/08/2012   CHOLHDL 3 06/08/2012  Using medications without problems: None, occ forgets to take Muscle aches: None  Exercise: none Diet: healthy, low carb.   Depression: Improved control on prozac, sleeping better at night.  Anxiety well controlled. No SI, o HI.  Staying active.       Review of Systems  Constitutional: Negative for fever and fatigue.  HENT: Negative for ear pain.   Eyes: Negative for pain.  Respiratory: Negative for chest tightness and shortness of  breath.   Cardiovascular: Negative for chest pain, palpitations and leg swelling.  Gastrointestinal: Negative for abdominal pain.  Genitourinary: Negative for dysuria.       Objective:   Physical Exam  Constitutional: She is oriented to person, place, and time. Vital signs are normal. She appears well-developed and well-nourished. She is cooperative.  Non-toxic appearance. She does not appear ill. No distress.  HENT:  Head: Normocephalic.  Right Ear: Hearing, tympanic membrane, external ear and ear canal normal. Tympanic membrane is not erythematous, not retracted and not bulging.  Left Ear: Hearing, tympanic membrane, external ear and ear canal normal. Tympanic membrane is not erythematous, not retracted and not bulging.  Nose: No mucosal edema or rhinorrhea. Right sinus exhibits no maxillary sinus tenderness and no frontal sinus tenderness. Left sinus exhibits no maxillary sinus tenderness and no frontal sinus tenderness.  Mouth/Throat: Uvula is midline, oropharynx is clear and moist and mucous membranes are normal.  Eyes: Conjunctivae, EOM and lids are normal. Pupils are equal, round, and reactive to light. No foreign bodies found.  Neck: Trachea normal and normal range of motion. Neck supple. Carotid bruit is not present. No mass and no thyromegaly present.  Cardiovascular: Normal rate, regular rhythm, S1 normal, S2 normal, normal heart sounds, intact distal pulses and normal pulses.  Exam reveals no gallop and no friction rub.   No murmur heard. Pulmonary/Chest: Effort normal and breath sounds normal. Not tachypneic. No  respiratory distress. She has no decreased breath sounds. She has no wheezes. She has no rhonchi. She has no rales.  Abdominal: Soft. Normal appearance and bowel sounds are normal. There is no tenderness.  Neurological: She is alert and oriented to person, place, and time. She has normal reflexes. No cranial nerve deficit or sensory deficit. She displays a negative Romberg  sign. Coordination and gait normal.  Skin: Skin is warm, dry and intact. No rash noted.  Psychiatric: Her speech is normal and behavior is normal. Judgment and thought content normal. Her mood appears not anxious. Cognition and memory are normal. She does not exhibit a depressed mood.          Assessment & Plan:

## 2012-06-15 LAB — SYPHILIS: RPR W/REFLEX TO RPR TITER AND TREPONEMAL ANTIBODIES, TRADITIONAL SCREENING AND DIAGNOSIS ALGORITHM

## 2012-06-25 ENCOUNTER — Telehealth: Payer: Self-pay

## 2012-06-25 NOTE — Telephone Encounter (Signed)
pts husband left v/m; pt held lipitor for one week; pain in arms may be slightly better but no significant improvement. Pt wants to know if needs to restart lipitor or what to do?Please advise.

## 2012-06-26 NOTE — Telephone Encounter (Signed)
Hold for 1 more week.. If not dramatically better, restart.

## 2012-06-26 NOTE — Telephone Encounter (Signed)
Patient advised.

## 2012-07-13 ENCOUNTER — Ambulatory Visit: Payer: Medicare Other | Admitting: Family Medicine

## 2012-07-17 ENCOUNTER — Ambulatory Visit (INDEPENDENT_AMBULATORY_CARE_PROVIDER_SITE_OTHER): Payer: Medicare Other | Admitting: Family Medicine

## 2012-07-17 ENCOUNTER — Encounter: Payer: Self-pay | Admitting: Family Medicine

## 2012-07-17 ENCOUNTER — Telehealth: Payer: Self-pay | Admitting: Family Medicine

## 2012-07-17 ENCOUNTER — Ambulatory Visit (INDEPENDENT_AMBULATORY_CARE_PROVIDER_SITE_OTHER)
Admission: RE | Admit: 2012-07-17 | Discharge: 2012-07-17 | Disposition: A | Discharge status: Home / Self Care | Payer: Medicare Other | Source: Ambulatory Visit | Attending: Family Medicine | Admitting: Family Medicine

## 2012-07-17 VITALS — BP 120/60 | HR 71 | Temp 97.9°F | Ht 60.75 in | Wt 152.0 lb

## 2012-07-17 DIAGNOSIS — R413 Other amnesia: Secondary | ICD-10-CM

## 2012-07-17 DIAGNOSIS — M79601 Pain in right arm: Secondary | ICD-10-CM

## 2012-07-17 DIAGNOSIS — R51 Headache: Secondary | ICD-10-CM

## 2012-07-17 DIAGNOSIS — Z5189 Encounter for other specified aftercare: Secondary | ICD-10-CM

## 2012-07-17 DIAGNOSIS — S060X0D Concussion without loss of consciousness, subsequent encounter: Secondary | ICD-10-CM

## 2012-07-17 DIAGNOSIS — M79609 Pain in unspecified limb: Secondary | ICD-10-CM

## 2012-07-17 NOTE — Progress Notes (Signed)
Subjective:    Patient ID: Crystal Newton, female    DOB: 01/14/1946, 67 y.o.   MRN: [id number]  HPI 67 year old femal sustained a coincussion on 2/24.  Prior to this she had been noting some menory issues. She has  Been doing mental rest, physical rest .  She continues to wake up with headache, tylenol used to help but not as much. Headaches do improve by the end of the day.  She as well as husband has noted some increase in confusion since 2/27. For example: she is confused about what people may have said, merging two thoughts into a third wrong thought. She is more out of balance lately.  No change in vision... She has been wearing old glasses ( not uptodate prescription). No numbness, no specific weakness but after trying to cook she feels tired weak all over, breaks out in sweat. No new incontinence.  No slurred speech. Some issues with word finding and expression of word.   On 2/27 she had a MMSE 29/30.  cbc, CMET, RPR, B12 nml.    She has been taken of statin for shoulder and arm pain. She reports this uis much better off the med.  Below labs were on her medication. Lab Results  Component Value Date   CHOL 181 06/08/2012   HDL 70.00 06/08/2012   LDLCALC 98 06/08/2012   TRIG 63.0 06/08/2012   CHOLHDL 3 06/08/2012   CK was nml 2/27  Mother with alzheimer's at age 23. Review of Systems  Constitutional: Positive for fatigue. Negative for fever.  HENT: Negative for ear pain.   Eyes: Negative for pain.  Respiratory: Negative for chest tightness and shortness of breath.   Cardiovascular: Negative for chest pain, palpitations and leg swelling.  Gastrointestinal: Negative for abdominal pain.  Genitourinary: Negative for dysuria.       Objective:   Physical Exam  Constitutional: She is oriented to person, place, and time. Vital signs are normal. She appears well-developed and well-nourished. She is cooperative.  Non-toxic appearance. She does not appear ill. No distress.   HENT:  Head: Normocephalic.  Right Ear: Hearing, tympanic membrane, external ear and ear canal normal. Tympanic membrane is not erythematous, not retracted and not bulging.  Left Ear: Hearing, tympanic membrane, external ear and ear canal normal. Tympanic membrane is not erythematous, not retracted and not bulging.  Nose: No mucosal edema or rhinorrhea. Right sinus exhibits no maxillary sinus tenderness and no frontal sinus tenderness. Left sinus exhibits no maxillary sinus tenderness and no frontal sinus tenderness.  Mouth/Throat: Uvula is midline, oropharynx is clear and moist and mucous membranes are normal.  Eyes: Conjunctivae, EOM and lids are normal. Pupils are equal, round, and reactive to light. No foreign bodies found.  Neck: Trachea normal and normal range of motion. Neck supple. Carotid bruit is not present. No mass and no thyromegaly present.  Cardiovascular: Normal rate, regular rhythm, S1 normal, S2 normal, normal heart sounds, intact distal pulses and normal pulses.  Exam reveals no gallop and no friction rub.   No murmur heard. Pulmonary/Chest: Effort normal and breath sounds normal. Not tachypneic. No respiratory distress. She has no decreased breath sounds. She has no wheezes. She has no rhonchi. She has no rales.  Abdominal: Soft. Normal appearance and bowel sounds are normal. There is no tenderness.  Neurological: She is alert and oriented to person, place, and time. She has normal reflexes. No cranial nerve deficit or sensory deficit. She displays a negative Romberg sign.  Coordination and gait normal.  Tremor when holding hands out, no specific intention tremor.  Skin: Skin is warm, dry and intact. No rash noted.  Psychiatric: Her speech is normal and behavior is normal. Judgment and thought content normal. Her mood appears not anxious. Cognition and memory are normal. She does not exhibit a depressed mood.          Assessment & Plan:

## 2012-07-17 NOTE — Telephone Encounter (Signed)
Referral ASAP please MArion.. Let me know when it is scheduled because if it is a wait.. We will move ahead with MRI brain before then.

## 2012-07-17 NOTE — Assessment & Plan Note (Addendum)
No gross neuro changes. Given recent head injury... Concerning for subdural hematoma.  Send for STAT Head CT.

## 2012-07-17 NOTE — Patient Instructions (Addendum)
Stop at front desk to set up head CT ASAP.

## 2012-07-17 NOTE — Assessment & Plan Note (Signed)
Improved off statin. 

## 2012-07-17 NOTE — Assessment & Plan Note (Signed)
This may be an issue ongoing prior to her fall.  Mother with history of Alzheimer's.  Lab eval negative.  Likely will proceed to neuro referral.. MRI prior if next appt not availbale soon.

## 2012-07-17 NOTE — Telephone Encounter (Signed)
Message copied by Excell Seltzer on Tue Jul 17, 2012  3:22 PM ------      Message from: [name]      Created: Tue Jul 17, 2012 12:25 PM       Advised patient of results.  She agrees to referral, would like to see someone in [place], but will go to [place] if she can get sooner appt. ------

## 2012-09-14 ENCOUNTER — Ambulatory Visit: Payer: Medicare Other | Admitting: Family Medicine

## 2012-10-17 LAB — HM DIABETES EYE EXAM

## 2012-10-25 ENCOUNTER — Other Ambulatory Visit: Payer: Self-pay | Admitting: Family Medicine

## 2012-11-05 ENCOUNTER — Other Ambulatory Visit: Payer: Self-pay | Admitting: Family Medicine

## 2012-11-23 ENCOUNTER — Other Ambulatory Visit: Payer: Self-pay | Admitting: Family Medicine

## 2012-12-13 ENCOUNTER — Telehealth: Payer: Self-pay | Admitting: Family Medicine

## 2012-12-13 DIAGNOSIS — E785 Hyperlipidemia, unspecified: Secondary | ICD-10-CM

## 2012-12-13 DIAGNOSIS — I10 Essential (primary) hypertension: Secondary | ICD-10-CM

## 2012-12-13 DIAGNOSIS — E119 Type 2 diabetes mellitus without complications: Secondary | ICD-10-CM

## 2012-12-13 NOTE — Telephone Encounter (Signed)
Message copied by Excell Seltzer on Thu Dec 13, 2012 10:41 AM ------      Message from: Alvina Chou      Created: Wed Dec 05, 2012  4:00 PM      Regarding: Lab orders for Friday, 8.29.14       Patient is scheduled for CPX labs, please order future labs, Thanks , Terri       ------

## 2012-12-14 ENCOUNTER — Other Ambulatory Visit (INDEPENDENT_AMBULATORY_CARE_PROVIDER_SITE_OTHER): Payer: Medicare Other

## 2012-12-14 DIAGNOSIS — E785 Hyperlipidemia, unspecified: Secondary | ICD-10-CM

## 2012-12-14 DIAGNOSIS — E119 Type 2 diabetes mellitus without complications: Secondary | ICD-10-CM

## 2012-12-14 LAB — COMPREHENSIVE METABOLIC PANEL
AST: 17 U/L (ref 0–37)
Albumin: 4.3 g/dL (ref 3.5–5.2)
Alkaline Phosphatase: 74 U/L (ref 39–117)
Glucose, Bld: 99 mg/dL (ref 70–99)
Potassium: 4.5 mEq/L (ref 3.5–5.1)
Sodium: 137 mEq/L (ref 135–145)
Total Bilirubin: 0.7 mg/dL (ref 0.3–1.2)
Total Protein: 7 g/dL (ref 6.0–8.3)

## 2012-12-14 LAB — LIPID PANEL
Cholesterol: 296 mg/dL — ABNORMAL HIGH (ref 0–200)
HDL: 50.4 mg/dL (ref >39.00)
Total CHOL/HDL Ratio: 6
VLDL: 42.2 mg/dL — ABNORMAL HIGH (ref 0.0–40.0)

## 2012-12-14 LAB — HEMOGLOBIN A1C: Hgb A1c MFr Bld: 5.7 % (ref 4.6–6.5)

## 2012-12-18 ENCOUNTER — Encounter: Payer: Self-pay | Admitting: Family Medicine

## 2012-12-18 ENCOUNTER — Ambulatory Visit (INDEPENDENT_AMBULATORY_CARE_PROVIDER_SITE_OTHER): Payer: Medicare Other | Admitting: Family Medicine

## 2012-12-18 VITALS — BP 90/62 | HR 59 | Temp 98.4°F | Ht 60.0 in | Wt 155.8 lb

## 2012-12-18 DIAGNOSIS — G43009 Migraine without aura, not intractable, without status migrainosus: Secondary | ICD-10-CM

## 2012-12-18 DIAGNOSIS — Z0000 Encounter for general adult medical examination without abnormal findings: Secondary | ICD-10-CM

## 2012-12-18 DIAGNOSIS — E119 Type 2 diabetes mellitus without complications: Secondary | ICD-10-CM

## 2012-12-18 DIAGNOSIS — R413 Other amnesia: Secondary | ICD-10-CM

## 2012-12-18 DIAGNOSIS — Z23 Encounter for immunization: Secondary | ICD-10-CM

## 2012-12-18 DIAGNOSIS — E785 Hyperlipidemia, unspecified: Secondary | ICD-10-CM

## 2012-12-18 DIAGNOSIS — G43909 Migraine, unspecified, not intractable, without status migrainosus: Secondary | ICD-10-CM

## 2012-12-18 DIAGNOSIS — I10 Essential (primary) hypertension: Secondary | ICD-10-CM

## 2012-12-18 DIAGNOSIS — F329 Major depressive disorder, single episode, unspecified: Secondary | ICD-10-CM

## 2012-12-18 LAB — HM DIABETES FOOT EXAM

## 2012-12-18 MED ORDER — ATORVASTATIN CALCIUM 80 MG PO TABS: ORAL_TABLET | ORAL | Status: DC

## 2012-12-18 NOTE — Assessment & Plan Note (Signed)
Over treated due to addition of propranolol. Stop HCTZ, follow at home closely.

## 2012-12-18 NOTE — Assessment & Plan Note (Signed)
Stable control on current med. 

## 2012-12-18 NOTE — Assessment & Plan Note (Signed)
Well controlled on no med. 

## 2012-12-18 NOTE — Assessment & Plan Note (Signed)
Poor control. Restart statin as arm pain ot clearly from this. Recheck lipids in 6 months.

## 2012-12-18 NOTE — Patient Instructions (Addendum)
Stop HCTZ. Follow blood pressure and pulse (60-90) at home.. Call if < 90/60 or  > 140/90 consistently. Restart atorvastatin.. Call if myalgias returns. Follow up at 6 months DM, Chol with fasting labs prior. Set up colonoscopy and mammogram when you are able.

## 2012-12-18 NOTE — Assessment & Plan Note (Signed)
Decreased headache frequency on propranolol.  

## 2012-12-18 NOTE — Assessment & Plan Note (Signed)
Decreased headache frequency on propranolol.

## 2012-12-18 NOTE — Assessment & Plan Note (Signed)
Improving with treat ment of sleep and migraine.

## 2012-12-18 NOTE — Progress Notes (Signed)
I have personally reviewed the Medicare Annual Wellness questionnaire and have noted   1. The patient's medical and social history   2. Their use of alcohol, tobacco or illicit drugs   3. Their current medications and supplements   4. The patient's functional ability including ADL's, fall risks, home safety risks and hearing or visual   impairment.   5. Diet and physical activities   6. Evidence for depression or mood disorders   The patients weight, height, BMI and visual acuity have been recorded in the chart   I have made referrals, counseling and provided education to the patient based review of the above and I have provided the pt with a written personalized care plan for preventive services.    Memory loss, post concussive headache Neurologist, Dr. Malvin Johns:   Reviewed last OV 10/2012 Dx with migraine.. Started on propranolol... Headaches are now less frequent and less intense. Sleep disorder improved with nortriptyline. She feels that her memory is improved. Also low B12, now treated.  Hypertension: Well controlled on lisinopril and HCTZ  ... On the low side today BP Readings from Last 3 Encounters:  12/18/12 90/62  07/17/12 120/60  06/14/12 120/72  Using medication without problems or lightheadedness: Yes   Chest pain with exertion:None   Edema:None   Short of breath:None   Average home BPs: 100/60 now that on propranolol. Other issues:    Diabetes: Controlled with diet and lifestyle , on no medication.   Lab Results  Component Value Date   HGBA1C 5.7 12/14/2012  Hypoglycemic episodes:None   Hyperglycemic episodes:None   Feet problems:None   Blood Sugars averaging:checking daily... FBS 98-104 average   eye exam within last year:10/2012   Wt Readings from Last 3 Encounters:  12/18/12 155 lb 12 oz (70.648 kg)  07/17/12 152 lb (68.947 kg)  06/14/12 153 lb (69.4 kg)   Elevated Cholesterol: Previously at goal LDL <100 on lipitor 80 mg, NOW OFF due to shoulder and arm pain  (she does feel that this med may not be causing the arm pain, she still has it and had B shoulder ache at times now) and LDL very poor control. Lab Results  Component Value Date   CHOL 296* 12/14/2012   HDL 50.40 12/14/2012   LDLCALC 98 06/08/2012   LDLDIRECT 214.9 12/14/2012   TRIG 211.0* 12/14/2012   CHOLHDL 6 12/14/2012   Using medications without problems: None Muscle aches: None   Exercise: NONE Diet: healthy, low carb.   Depression: Improved control on prozac, sleeping better at night on amitriptyline.   Anxiety well controlled. No SI, o HI. Staying active.   Review of Systems   Constitutional: Negative for fever, but does have some fatigue (keeping 4 grandchildren) HENT: Negative for ear pain.   Eyes: Negative for pain.   Respiratory: Negative for chest tightness and shortness of breath.   Cardiovascular: Negative for chest pain, palpitations and leg swelling.   Gastrointestinal: Negative for abdominal pain.  No D/C, some constipation with nortriptyline. Genitourinary: Negative for dysuria.     Objective:     Physical Exam  Constitutional: Vital signs are normal. She appears well-developed and well-nourished. She is cooperative. Non-toxic appearance. She does not appear ill. No distress.   HENT:   Head: Normocephalic.  Right Ear: Hearing, tympanic membrane, external ear and ear canal normal.  Left Ear: Hearing, tympanic membrane, external ear and ear canal normal.   Nose: Nose normal.   Eyes: Conjunctivae, EOM and lids are normal. Pupils are  equal, round, and reactive to light. No foreign bodies found.   Neck: Trachea normal and normal range of motion. Neck supple. Carotid bruit is not present. No mass and no thyromegaly present.   Cardiovascular: Normal rate, regular rhythm, S1 normal, S2 normal, normal heart sounds and intact distal pulses. Exam reveals no gallop.   No murmur heard.   Pulmonary/Chest: Effort normal and breath sounds normal. No respiratory distress. She  has no wheezes. She has no rhonchi. She has no rales.   Abdominal: Soft. Normal appearance and bowel sounds are normal. She exhibits no distension, no fluid wave, no abdominal bruit and no mass. There is no hepatosplenomegaly. There is no tenderness. There is no rebound, no guarding and no CVA tenderness. No hernia.  Genitourinary: Not performed this year. Lymphadenopathy:  She has no cervical adenopathy.  She has no axillary adenopathy.  Neurological: She is alert. She has normal strength. No cranial nerve deficit or sensory deficit.   Skin: Skin is warm, dry and intact. No rash noted.  Psychiatric: Her speech is normal and behavior is normal. Judgment normal. Her mood appears not anxious. Cognition and memory are normal. She does not exhibit a depressed mood.     Diabetic foot exam:   Normal inspection   No skin breakdown   No calluses   Normal DP pulses   Normal sensation to light touch and monofilament   Nails normal      ASSESSMENT AND PLAN:  The patient's preventative maintenance and recommended screening tests for an annual wellness exam were reviewed in full today. Brought up to date unless services declined.   Counselled on the importance of diet, exercise, and its role in overall health and mortality. The patient's FH and SH was reviewed, including their home life, tobacco status, and drug and alcohol status.    Vaccines:Uptodate with TD and PNA,  shingles vaccine not covered by insurance. Colon:  Last colonoscopy 2002 Dr. Eliberto Ivory in [place], [place]. Due in 2012. Has not had colonoscopy yet. DVE/pap: partial hysterectomy, has both ovaries. Asymptomatic, no family history. Nml DVE 2012, not interested in further DVE unless symptomatic. Mammo: due DEXA: Wishes to hold off.

## 2012-12-20 ENCOUNTER — Other Ambulatory Visit: Payer: Self-pay | Admitting: Family Medicine

## 2013-06-01 ENCOUNTER — Other Ambulatory Visit: Payer: Self-pay | Admitting: Family Medicine

## 2013-06-03 ENCOUNTER — Other Ambulatory Visit: Payer: Self-pay | Admitting: Family Medicine

## 2013-06-11 ENCOUNTER — Other Ambulatory Visit: Payer: Medicare Other

## 2013-06-11 ENCOUNTER — Telehealth: Payer: Self-pay | Admitting: Family Medicine

## 2013-06-11 DIAGNOSIS — E119 Type 2 diabetes mellitus without complications: Secondary | ICD-10-CM

## 2013-06-11 DIAGNOSIS — E785 Hyperlipidemia, unspecified: Secondary | ICD-10-CM

## 2013-06-11 NOTE — Telephone Encounter (Signed)
Message copied by Excell SeltzerBEDSOLE, Langston Summerfield E on Tue Jun 11, 2013  7:53 AM ------      Message from: Alvina ChouWALSH, TERRI J      Created: Thu Jun 06, 2013 11:24 AM      Regarding: lab orders for Tuesday,2.24.15       Labs for a f/u appt ------

## 2013-06-17 ENCOUNTER — Other Ambulatory Visit (INDEPENDENT_AMBULATORY_CARE_PROVIDER_SITE_OTHER): Payer: Medicare Other

## 2013-06-17 DIAGNOSIS — E119 Type 2 diabetes mellitus without complications: Secondary | ICD-10-CM

## 2013-06-17 DIAGNOSIS — E785 Hyperlipidemia, unspecified: Secondary | ICD-10-CM

## 2013-06-17 LAB — COMPREHENSIVE METABOLIC PANEL
ALK PHOS: 76 U/L (ref 39–117)
ALT: 32 U/L (ref 0–35)
AST: 32 U/L (ref 0–37)
Albumin: 3.6 g/dL (ref 3.5–5.2)
BILIRUBIN TOTAL: 0.7 mg/dL (ref 0.3–1.2)
BUN: 15 mg/dL (ref 6–23)
CO2: 28 mEq/L (ref 19–32)
Calcium: 8.8 mg/dL (ref 8.4–10.5)
Chloride: 108 mEq/L (ref 96–112)
Creatinine, Ser: 0.6 mg/dL (ref 0.4–1.2)
GFR: 112.12 mL/min (ref >60.00)
Glucose, Bld: 90 mg/dL (ref 70–99)
Potassium: 4.5 mEq/L (ref 3.5–5.1)
SODIUM: 142 meq/L (ref 135–145)
Total Protein: 6 g/dL (ref 6.0–8.3)

## 2013-06-17 LAB — LIPID PANEL
Cholesterol: 133 mg/dL (ref 0–200)
HDL: 46.8 mg/dL (ref >39.00)
LDL CALC: 70 mg/dL (ref 0–99)
TRIGLYCERIDES: 80 mg/dL (ref 0.0–149.0)
Total CHOL/HDL Ratio: 3
VLDL: 16 mg/dL (ref 0.0–40.0)

## 2013-06-17 LAB — HEMOGLOBIN A1C: Hgb A1c MFr Bld: 5.6 % (ref 4.6–6.5)

## 2013-06-18 ENCOUNTER — Encounter: Payer: Self-pay | Admitting: Family Medicine

## 2013-06-18 ENCOUNTER — Ambulatory Visit (INDEPENDENT_AMBULATORY_CARE_PROVIDER_SITE_OTHER): Payer: Medicare Other | Admitting: Family Medicine

## 2013-06-18 VITALS — BP 149/81 | HR 59 | Temp 98.6°F | Ht 60.0 in | Wt 175.0 lb

## 2013-06-18 DIAGNOSIS — E119 Type 2 diabetes mellitus without complications: Secondary | ICD-10-CM

## 2013-06-18 DIAGNOSIS — I10 Essential (primary) hypertension: Secondary | ICD-10-CM

## 2013-06-18 DIAGNOSIS — E785 Hyperlipidemia, unspecified: Secondary | ICD-10-CM

## 2013-06-18 LAB — HM DIABETES FOOT EXAM

## 2013-06-18 NOTE — Assessment & Plan Note (Signed)
Well controlled at home off HCTZ on lisinopril and propranolol at home... Slightly elevated  Here, but continue to follow.

## 2013-06-18 NOTE — Progress Notes (Signed)
Subjective:    Patient ID: Crystal Newton, female    DOB: 18-Feb-1946, 68 y.o.   MRN: [id number]  HPI  68 year old female presents for 6 month follow up.  Memory loss, post concussive headache  Neurologist, Dr. Malvin JohnsPotter: Reviewed last OV 10/2012  Dx with migraine.. Started on propranolol... Headaches are now less frequent and less intense.  Sleep disorder improved with nortriptyline.  She feels that her memory is improved. Also low B12, now treated.   Hypertension: Inadequately controlled  here on lisinopril and propranolol as she is now off HCTZ (she was low last OV)  BP Readings from Last 3 Encounters:  06/18/13 149/81  12/18/12 90/62  07/17/12 120/60  Using medication without problems or lightheadedness: Yes  Chest pain with exertion:None  Edema:None  Short of breath:None  Average home BPs: At home 116/70-80 Other issues:   Diabetes: Controlled with diet and lifestyle , on no medication.  Lab Results  Component Value Date   HGBA1C 5.6 06/17/2013  Hypoglycemic episodes:None  Hyperglycemic episodes:None  Feet problems:None  Blood Sugars averaging:checking daily... FBS 98-104 average  eye exam within last year:yes Wt Readings from Last 3 Encounters:  06/18/13 175 lb (79.379 kg)  12/18/12 155 lb 12 oz (70.648 kg)  07/17/12 152 lb (68.947 kg)    Elevated Cholesterol:  LDL back at goal < 70 on statin 80 mg daily Lab Results  Component Value Date   CHOL 133 06/17/2013   HDL 46.80 06/17/2013   LDLCALC 70 06/17/2013   LDLDIRECT 214.9 12/14/2012   TRIG 80.0 06/17/2013   CHOLHDL 3 06/17/2013   Using medications without problems: None  Muscle aches: None  Exercise: NONE  Diet: healthy, low carb.  Depression: Improved control on prozac, sleeping better at night on amitriptyline.  Anxiety well controlled. No SI, o HI.  Staying active.    Review of Systems  Constitutional: Negative for fever and fatigue.  HENT: Negative for ear pain.   Eyes: Negative for pain.  Respiratory:  Negative for chest tightness and shortness of breath.   Cardiovascular: Negative for chest pain, palpitations and leg swelling.  Gastrointestinal: Negative for abdominal pain.  Genitourinary: Negative for dysuria.       Objective:   Physical Exam  Constitutional: Vital signs are normal. She appears well-developed and well-nourished. She is cooperative.  Non-toxic appearance. She does not appear ill. No distress.  overweight  HENT:  Head: Normocephalic.  Right Ear: Hearing, tympanic membrane, external ear and ear canal normal. Tympanic membrane is not erythematous, not retracted and not bulging.  Left Ear: Hearing, tympanic membrane, external ear and ear canal normal. Tympanic membrane is not erythematous, not retracted and not bulging.  Nose: No mucosal edema or rhinorrhea. Right sinus exhibits no maxillary sinus tenderness and no frontal sinus tenderness. Left sinus exhibits no maxillary sinus tenderness and no frontal sinus tenderness.  Mouth/Throat: Uvula is midline, oropharynx is clear and moist and mucous membranes are normal.  Eyes: Conjunctivae, EOM and lids are normal. Pupils are equal, round, and reactive to light. Lids are everted and swept, no foreign bodies found.  Neck: Trachea normal and normal range of motion. Neck supple. Carotid bruit is not present. No mass and no thyromegaly present.  Cardiovascular: Normal rate, regular rhythm, S1 normal, S2 normal, normal heart sounds, intact distal pulses and normal pulses.  Exam reveals no gallop and no friction rub.   No murmur heard. Pulmonary/Chest: Effort normal and breath sounds normal. Not tachypneic. No respiratory distress. She  has no decreased breath sounds. She has no wheezes. She has no rhonchi. She has no rales.  Abdominal: Soft. Normal appearance and bowel sounds are normal. There is no tenderness.  Neurological: She is alert.  Skin: Skin is warm, dry and intact. No rash noted.  Psychiatric: Her speech is normal and  behavior is normal. Judgment and thought content normal. Her mood appears not anxious. Cognition and memory are normal. She does not exhibit a depressed mood.    Diabetic foot exam: Normal inspection No skin breakdown No calluses  Normal DP pulses Normal sensation to light touch and monofilament Nails normal       Assessment & Plan:

## 2013-06-18 NOTE — Assessment & Plan Note (Signed)
BAck at goal back on statin. NO SE!!!

## 2013-06-18 NOTE — Assessment & Plan Note (Signed)
Well controlled on no med. Encouraged exercise, weight loss, healthy eating habits.   

## 2013-06-18 NOTE — Progress Notes (Signed)
Pre visit review using our clinic review tool, if applicable. No additional management support is needed unless otherwise documented below in the visit note. 

## 2013-06-18 NOTE — Patient Instructions (Addendum)
Continue to follow BP at home, call if it is greater than 130/80.  Bring your BP monitor to next OV.   Keep working on healthy eating and exercise!  Diabetes and Standards of Medical Care  Diabetes is complicated. You may find that your diabetes team includes a dietitian, nurse, diabetes educator, eye doctor, and more. To help everyone know what is going on and to help you get the care you deserve, the following schedule of care was developed to help keep you on track. Below are the tests, exams, vaccines, medicines, education, and plans you will need. HbA1c test This test shows how well you have controlled your glucose over the past 2 3 months. It is used to see if your diabetes management plan needs to be adjusted.   It is performed at least 2 times a year if you are meeting treatment goals.  It is performed 4 times a year if therapy has changed or if you are not meeting treatment goals. Blood pressure test  This test is performed at every routine medical visit. The goal is less than 140/90 mmHg for most people, but 130/80 mmHg in some cases. Ask your health care provider about your goal. Dental exam  Follow up with the dentist regularly. Eye exam  If you are diagnosed with type 1 diabetes as a child, get an exam upon reaching the age of 73 years or older and have had diabetes for 3 5 years. Yearly eye exams are recommended after that initial eye exam.  If you are diagnosed with type 1 diabetes as an adult, get an exam within 5 years of diagnosis and then yearly.  If you are diagnosed with type 2 diabetes, get an exam as soon as possible after the diagnosis and then yearly. Foot care exam  Visual foot exams are performed at every routine medical visit. The exams check for cuts, injuries, or other problems with the feet.  A comprehensive foot exam should be done yearly. This includes visual inspection as well as assessing foot pulses and testing for loss of sensation.  Check your feet  nightly for cuts, injuries, or other problems with your feet. Tell your health care provider if anything is not healing. Kidney function test (urine microalbumin)  This test is performed once a year.  Type 1 diabetes: The first test is performed 5 years after diagnosis.  Type 2 diabetes: The first test is performed at the time of diagnosis.  A serum creatinine and estimated glomerular filtration rate (eGFR) test is done once a year to assess the level of chronic kidney disease (CKD), if present. Lipid profile (cholesterol, HDL, LDL, triglycerides)  Performed every 5 years for most people.  The goal for LDL is less than 100 mg/dL. If you are at high risk, the goal is less than 70 mg/dL.  The goal for HDL is 40 mg/dL 50 mg/dL for men and 50 mg/dL 60 mg/dL for women. An HDL cholesterol of 60 mg/dL or higher gives some protection against heart disease.  The goal for triglycerides is less than 150 mg/dL. Influenza vaccine, pneumococcal vaccine, and hepatitis B vaccine  The influenza vaccine is recommended yearly.  The pneumococcal vaccine is generally given once in a lifetime. However, there are some instances when another vaccination is recommended. Check with your health care provider.  The hepatitis B vaccine is also recommended for adults with diabetes. Diabetes self-management education  Education is recommended at diagnosis and ongoing as needed. Treatment plan  Your treatment  plan is reviewed at every medical visit. Document Released: 01/30/2009 Document Revised: 12/05/2012 Document Reviewed: 09/04/2012 [facility] Patient Information 2014 [place].

## 2013-06-19 ENCOUNTER — Telehealth: Payer: Self-pay

## 2013-06-19 ENCOUNTER — Telehealth: Payer: Self-pay | Admitting: Family Medicine

## 2013-06-19 NOTE — Telephone Encounter (Signed)
Relevant patient education assigned to patient using Emmi. ° °

## 2013-06-22 ENCOUNTER — Other Ambulatory Visit: Payer: Self-pay | Admitting: Family Medicine

## 2013-07-31 ENCOUNTER — Emergency Department (HOSPITAL_COMMUNITY): Payer: Medicare Other

## 2013-07-31 ENCOUNTER — Emergency Department (HOSPITAL_COMMUNITY)
Admission: EM | Admit: 2013-07-31 | Discharge: 2013-08-01 | Disposition: A | Discharge status: Home / Self Care | Payer: Medicare Other | Attending: Emergency Medicine | Admitting: Emergency Medicine

## 2013-07-31 ENCOUNTER — Encounter (HOSPITAL_COMMUNITY): Payer: Self-pay | Admitting: Emergency Medicine

## 2013-07-31 DIAGNOSIS — E119 Type 2 diabetes mellitus without complications: Secondary | ICD-10-CM | POA: Insufficient documentation

## 2013-07-31 DIAGNOSIS — I10 Essential (primary) hypertension: Secondary | ICD-10-CM | POA: Insufficient documentation

## 2013-07-31 DIAGNOSIS — N390 Urinary tract infection, site not specified: Secondary | ICD-10-CM | POA: Insufficient documentation

## 2013-07-31 DIAGNOSIS — Z7982 Long term (current) use of aspirin: Secondary | ICD-10-CM | POA: Insufficient documentation

## 2013-07-31 DIAGNOSIS — Z9049 Acquired absence of other specified parts of digestive tract: Secondary | ICD-10-CM | POA: Insufficient documentation

## 2013-07-31 DIAGNOSIS — Z79899 Other long term (current) drug therapy: Secondary | ICD-10-CM | POA: Insufficient documentation

## 2013-07-31 DIAGNOSIS — N200 Calculus of kidney: Secondary | ICD-10-CM | POA: Insufficient documentation

## 2013-07-31 DIAGNOSIS — Z90710 Acquired absence of both cervix and uterus: Secondary | ICD-10-CM | POA: Insufficient documentation

## 2013-07-31 DIAGNOSIS — R000 Tachycardia, unspecified: Secondary | ICD-10-CM | POA: Insufficient documentation

## 2013-07-31 HISTORY — DX: Calculus of kidney: N20.0

## 2013-07-31 LAB — URINALYSIS, ROUTINE W REFLEX MICROSCOPIC
GLUCOSE, UA: NEGATIVE mg/dL
Nitrite: POSITIVE — AB
PH: 5 (ref 5.0–8.0)
Protein, ur: NEGATIVE mg/dL
Specific Gravity, Urine: 1.026 (ref 1.005–1.030)
Urobilinogen, UA: 1 mg/dL (ref 0.0–1.0)

## 2013-07-31 LAB — COMPREHENSIVE METABOLIC PANEL
ALBUMIN: 4.1 g/dL (ref 3.5–5.2)
ALT: 23 U/L (ref 0–35)
AST: 21 U/L (ref 0–37)
Alkaline Phosphatase: 103 U/L (ref 39–117)
BILIRUBIN TOTAL: 1.1 mg/dL (ref 0.3–1.2)
BUN: 20 mg/dL (ref 6–23)
CHLORIDE: 103 meq/L (ref 96–112)
CO2: 20 mEq/L (ref 19–32)
CREATININE: 0.59 mg/dL (ref 0.50–1.10)
Calcium: 9.3 mg/dL (ref 8.4–10.5)
GFR calc non Af Amer: >90 mL/min (ref >90)
Glucose, Bld: 175 mg/dL — ABNORMAL HIGH (ref 70–99)
Potassium: 4.1 mEq/L (ref 3.7–5.3)
Sodium: 138 mEq/L (ref 137–147)
Total Protein: 6.7 g/dL (ref 6.0–8.3)

## 2013-07-31 LAB — CBC WITH DIFFERENTIAL/PLATELET
Basophils Absolute: 0 10*3/uL (ref 0.0–0.1)
Basophils Relative: 0 % (ref 0–1)
Eosinophils Absolute: 0 10*3/uL (ref 0.0–0.7)
Eosinophils Relative: 0 % (ref 0–5)
HEMATOCRIT: 39 % (ref 36.0–46.0)
HEMOGLOBIN: 12.9 g/dL (ref 12.0–15.0)
Lymphocytes Relative: 3 % — ABNORMAL LOW (ref 12–46)
Lymphs Abs: 0.5 10*3/uL — ABNORMAL LOW (ref 0.7–4.0)
MCH: 31.3 pg (ref 26.0–34.0)
MCHC: 33.1 g/dL (ref 30.0–36.0)
MCV: 94.7 fL (ref 78.0–100.0)
MONO ABS: 1 10*3/uL (ref 0.1–1.0)
MONOS PCT: 6 % (ref 3–12)
NEUTROS ABS: 15.8 10*3/uL — AB (ref 1.7–7.7)
Neutrophils Relative %: 91 % — ABNORMAL HIGH (ref 43–77)
Platelets: 186 10*3/uL (ref 150–400)
RBC: 4.12 MIL/uL (ref 3.87–5.11)
RDW: 12.3 % (ref 11.5–15.5)
WBC: 17.4 10*3/uL — ABNORMAL HIGH (ref 4.0–10.5)

## 2013-07-31 LAB — URINE MICROSCOPIC-ADD ON

## 2013-07-31 MED ORDER — CIPROFLOXACIN HCL 500 MG PO TABS: 500.0000 mg | ORAL_TABLET | Freq: Two times a day (BID) | ORAL | Status: DC

## 2013-07-31 MED ORDER — ONDANSETRON HCL 4 MG/2ML IJ SOLN
4.0000 mg | Freq: Once | INTRAMUSCULAR | Status: AC
  Administered 2013-07-31: 4 mg via INTRAVENOUS
  Filled 2013-07-31: qty 2

## 2013-07-31 MED ORDER — CIPROFLOXACIN IN D5W 400 MG/200ML IV SOLN
400.0000 mg | Freq: Once | INTRAVENOUS | Status: AC
  Administered 2013-07-31: 400 mg via INTRAVENOUS
  Filled 2013-07-31: qty 200

## 2013-07-31 MED ORDER — MORPHINE SULFATE 4 MG/ML IJ SOLN
4.0000 mg | Freq: Once | INTRAMUSCULAR | Status: AC
  Administered 2013-07-31: 4 mg via INTRAVENOUS
  Filled 2013-07-31: qty 1

## 2013-07-31 MED ORDER — PROMETHAZINE HCL 25 MG PO TABS: 25.0000 mg | ORAL_TABLET | Freq: Three times a day (TID) | ORAL | Status: DC | PRN

## 2013-07-31 MED ORDER — HYDROCODONE-ACETAMINOPHEN 5-325 MG PO TABS: 1.0000 | ORAL_TABLET | Freq: Four times a day (QID) | ORAL | Status: DC | PRN

## 2013-07-31 MED ORDER — SODIUM CHLORIDE 0.9 % IV BOLUS (SEPSIS)
1000.0000 mL | Freq: Once | INTRAVENOUS | Status: AC
  Administered 2013-07-31: 1000 mL via INTRAVENOUS

## 2013-07-31 MED ORDER — OXYCODONE-ACETAMINOPHEN 5-325 MG PO TABS
1.0000 | ORAL_TABLET | Freq: Once | ORAL | Status: AC
  Administered 2013-07-31: 1 via ORAL
  Filled 2013-07-31: qty 1

## 2013-07-31 NOTE — ED Provider Notes (Addendum)
CSN: [id number]     Arrival date & time 07/31/13  2004 History   First MD Initiated Contact with Patient 07/31/13 2026     Chief Complaint  Patient presents with  . Nephrolithiasis  . Back Pain     (Consider location/radiation/quality/duration/timing/severity/associated sxs/prior Treatment) Patient is a 68 y.o. female presenting with flank pain. The history is provided by the patient.  Flank Pain This is a new problem. The current episode started yesterday. The problem occurs constantly. The problem has not changed since onset.Pertinent negatives include no chest pain and no shortness of breath. Associated symptoms comments: Left flank pain radiating to the suprapubic region with dysuria and frequency.  N/V but denies fever.. Exacerbated by: urinating. Nothing relieves the symptoms. Treatments tried: azo. The treatment provided no relief.    Past Medical History  Diagnosis Date  . Diabetes mellitus   . Hyperlipidemia   . Hypertension   . Anxiety   . Depression   . Kidney stones    Past Surgical History  Procedure Laterality Date  . Partial colectomy  1996    ruptured abcess for diverticulitis  . Partial hysterectomy      uterine prolapse  . Right parotid    . Breast biopsy    . Abdominal hysterectomy     Family History  Problem Relation Age of Onset  . Alzheimer's disease Mother   . Diabetes Maternal Grandfather   . Coronary artery disease Maternal Grandfather    History  Substance Use Topics  . Smoking status: Never Smoker   . Smokeless tobacco: Never Used  . Alcohol Use: No   OB History   Grav Para Term Preterm Abortions TAB SAB Ect Mult Living                 Review of Systems  Respiratory: Negative for cough and shortness of breath.   Cardiovascular: Negative for chest pain and leg swelling.  Genitourinary: Positive for hematuria and flank pain.  All other systems reviewed and are negative.     Allergies  Review of patient's allergies indicates no  known allergies.  Home Medications   Prior to Admission medications   Medication Sig Start Date End Date Taking? Authorizing Provider  aspirin 81 MG tablet Take 81 mg by mouth daily.      Historical Provider, MD  atorvastatin (LIPITOR) 80 MG tablet TAKE 1 TABLET (80 MG TOTAL) BY MOUTH DAILY. 12/18/12   Amy Cletis Athens, MD  Blood Glucose Monitoring Suppl (BAYER BREEZE 2 SYSTEM) W/DEVICE KIT by Does not apply route.      Historical Provider, MD  FLUoxetine (PROZAC) 40 MG capsule TAKE 1 CAPSULE (40 MG TOTAL) BY MOUTH DAILY.    Amy E Diona Browner, MD  lisinopril (PRINIVIL,ZESTRIL) 5 MG tablet TAKE 1 TABLET (5 MG TOTAL) BY MOUTH DAILY.    Jinny Sanders, MD  Multiple Vitamin (MULTIVITAMIN) tablet Take 1 tablet by mouth daily.    Historical Provider, MD  NEXIUM 40 MG capsule TAKE 1 CAPSULE (40 MG TOTAL) BY MOUTH DAILY. 12/20/12   Amy Cletis Athens, MD  nortriptyline (PAMELOR) 10 MG capsule Take 30 mg by mouth at bedtime.     Historical Provider, MD  propranolol (INDERAL) 40 MG tablet Take 40 mg by mouth 2 (two) times daily.    Historical Provider, MD   BP 139/64  Pulse 110  Temp(Src) 97.7 F (36.5 C) (Oral)  Resp 18  SpO2 99% Physical Exam  Nursing note and vitals reviewed. Constitutional: She is  oriented to person, place, and time. She appears well-developed and well-nourished. She appears distressed.  HENT:  Head: Normocephalic and atraumatic.  Mouth/Throat: Oropharynx is clear and moist.  Eyes: Conjunctivae and EOM are normal. Pupils are equal, round, and reactive to light.  Neck: Normal range of motion. Neck supple.  Cardiovascular: Regular rhythm and intact distal pulses.  Tachycardia present.   No murmur heard. Pulmonary/Chest: Effort normal and breath sounds normal. No respiratory distress. She has no wheezes. She has no rales.  Abdominal: Soft. She exhibits no distension. There is tenderness in the suprapubic area. There is no rebound, no guarding and no CVA tenderness.  Musculoskeletal: Normal  range of motion. She exhibits no edema and no tenderness.  Neurological: She is alert and oriented to person, place, and time.  Skin: Skin is warm and dry. No rash noted. No erythema. There is pallor.  Psychiatric: She has a normal mood and affect. Her behavior is normal.    ED Course  Procedures (including critical care time) Labs Review Labs Reviewed  URINALYSIS, ROUTINE W REFLEX MICROSCOPIC - Abnormal; Notable for the following:    Color, Urine ORANGE (*)    APPearance CLOUDY (*)    Hgb urine dipstick TRACE (*)    Bilirubin Urine SMALL (*)    Ketones, ur >80 (*)    Nitrite POSITIVE (*)    Leukocytes, UA MODERATE (*)    All other components within normal limits  CBC WITH DIFFERENTIAL - Abnormal; Notable for the following:    WBC 17.4 (*)    Neutrophils Relative % 91 (*)    Neutro Abs 15.8 (*)    Lymphocytes Relative 3 (*)    Lymphs Abs 0.5 (*)    All other components within normal limits  COMPREHENSIVE METABOLIC PANEL - Abnormal; Notable for the following:    Glucose, Bld 175 (*)    All other components within normal limits  URINE MICROSCOPIC-ADD ON - Abnormal; Notable for the following:    Bacteria, UA MANY (*)    All other components within normal limits  URINE CULTURE    Imaging Review Ct Abdomen Pelvis Wo Contrast  07/31/2013   CLINICAL DATA:  Left low back pain. Emesis. Dysuria. History of kidney stones.  EXAM: CT ABDOMEN AND PELVIS WITHOUT CONTRAST  TECHNIQUE: Multidetector CT imaging of the abdomen and pelvis was performed following the standard protocol without intravenous contrast.  COMPARISON:  02/05/2008  FINDINGS: Lung bases are clear. No pleural fluid. There is a small amount of pericardial fluid. Aortic and coronary artery calcifications are present.  The liver has a normal appearance. There are a few small calcified gallstones dependent within the gallbladder. No CT evidence of cholecystitis, obstruction or passing stone. The spleen is normal. The pancreas is  normal. The adrenal glands are normal. The right kidney is normal. No cyst, mass, stone or hydronephrosis. The left kidney is mildly swollen with perinephric edema. The renal collecting system and ureter are moderately dilated to the UVJ where there is a 5.5 mm stone. No stone in the bladder.  There is atherosclerosis of the aorta and its branch vessels but no aneurysm. The IVC is normal. No retroperitoneal mass or adenopathy. No free intraperitoneal fluid or air. No bowel pathology is seen. There is curvature and degenerative disease of the spine.  IMPRESSION: 5.5 mm stone at the left UVJ with hydroureteronephrosis on the left, mild swelling of the left kidney and perinephric edema. No other urinary tract calculi.  Few small gallstones without evidence  of cholecystitis or obstruction by CT.  Atherosclerosis of the aorta and its branch vessels. Coronary artery calcification.  Small pericardial effusion.   Electronically Signed   By: Nelson Chimes M.D.   On: 07/31/2013 21:15     EKG Interpretation None      MDM   Final diagnoses:  Kidney stone  UTI (lower urinary tract infection)    Pt with symptoms consistent with kidney stone with hx of the same requiring lithotripsy and basket removal.  Denies infectious sx, or GI symptoms.  Low concern for diverticulitis and AAA.  No hx suggestive of GU source (discharge).  Will hydrate, treat pain and ensure no infection with UA, CBC, CMP and will get stone study to further eval.   9:45 PM Pt with evidence of infected 5.5cm UVJ stone with positive nitrites and leukocytes.  Leukocytosis of 17,000.  After first round of meds pt feeling much better now pain is 3/10.  Spoke with Dr. Alinda Money with urology and he recommends close follow up with the office tomorrow or Friday.  Pt given strict return precautions and daughter also present.  Pt given Cipro here for UTI IV and culture sent.  Pt tolerating po's.  D/ced home with pain and nausea meds.  Also given  cipro.  Blanchie Dessert, MD 07/31/13 [id number]  Blanchie Dessert, MD 07/31/13 2230  Blanchie Dessert, MD 07/31/13 2237

## 2013-07-31 NOTE — ED Notes (Signed)
Pt. reports left lower back pain with emesis Lauralyn Primes/dysuyria onset this morning , pt. stated history of kidney stones.

## 2013-08-01 MED ORDER — ONDANSETRON HCL 4 MG/2ML IJ SOLN
4.0000 mg | Freq: Once | INTRAMUSCULAR | Status: AC
  Administered 2013-08-01: 4 mg via INTRAVENOUS
  Filled 2013-08-01: qty 2

## 2013-08-03 LAB — URINE CULTURE: Colony Count: >=100000

## 2013-08-04 ENCOUNTER — Telehealth (HOSPITAL_BASED_OUTPATIENT_CLINIC_OR_DEPARTMENT_OTHER): Payer: Self-pay | Admitting: Emergency Medicine

## 2013-08-04 NOTE — Telephone Encounter (Signed)
Post ED Visit - Positive Culture Follow-up  Culture report reviewed by antimicrobial stewardship pharmacist: [x]  Wes Dulaney, Pharm.D., BCPS []  Celedonio MiyamotoJeremy Frens, [address] BCPS []  Georgina PillionElizabeth Martin, Pharm.D., BCPS []  ReedsportMinh Pham, [address] BCPS, AAHIVP []  Estella HuskMichelle Turner, Pharm.D., BCPS, AAHIVP []  Harvie JuniorNathan Cope, Pharm.D.  Positive urine culture Treated with Cipro, organism sensitive to the same and no further patient follow-up is required at this time.  Kilian Schwartz 08/04/2013, 5:59 PM

## 2013-08-08 ENCOUNTER — Other Ambulatory Visit: Payer: Self-pay | Admitting: Family Medicine

## 2013-12-03 ENCOUNTER — Other Ambulatory Visit: Payer: Self-pay | Admitting: Family Medicine

## 2013-12-04 ENCOUNTER — Other Ambulatory Visit: Payer: Self-pay | Admitting: Family Medicine

## 2013-12-13 ENCOUNTER — Telehealth: Payer: Self-pay | Admitting: Family Medicine

## 2013-12-13 ENCOUNTER — Other Ambulatory Visit (INDEPENDENT_AMBULATORY_CARE_PROVIDER_SITE_OTHER): Payer: Medicare Other

## 2013-12-13 DIAGNOSIS — E785 Hyperlipidemia, unspecified: Secondary | ICD-10-CM

## 2013-12-13 DIAGNOSIS — E119 Type 2 diabetes mellitus without complications: Secondary | ICD-10-CM

## 2013-12-13 DIAGNOSIS — I10 Essential (primary) hypertension: Secondary | ICD-10-CM

## 2013-12-13 LAB — COMPREHENSIVE METABOLIC PANEL
ALK PHOS: 92 U/L (ref 39–117)
ALT: 18 U/L (ref 0–35)
AST: 21 U/L (ref 0–37)
Albumin: 4 g/dL (ref 3.5–5.2)
BILIRUBIN TOTAL: 0.6 mg/dL (ref 0.2–1.2)
BUN: 23 mg/dL (ref 6–23)
CO2: 28 mEq/L (ref 19–32)
CREATININE: 0.7 mg/dL (ref 0.4–1.2)
Calcium: 9.4 mg/dL (ref 8.4–10.5)
Chloride: 106 mEq/L (ref 96–112)
GFR: 91.33 mL/min (ref >60.00)
Glucose, Bld: 99 mg/dL (ref 70–99)
Potassium: 4.5 mEq/L (ref 3.5–5.1)
SODIUM: 141 meq/L (ref 135–145)
Total Protein: 6.7 g/dL (ref 6.0–8.3)

## 2013-12-13 LAB — LIPID PANEL
Cholesterol: 155 mg/dL (ref 0–200)
HDL: 55.9 mg/dL (ref >39.00)
LDL Cholesterol: 81 mg/dL (ref 0–99)
NONHDL: 99.1
TRIGLYCERIDES: 92 mg/dL (ref 0.0–149.0)
Total CHOL/HDL Ratio: 3
VLDL: 18.4 mg/dL (ref 0.0–40.0)

## 2013-12-13 LAB — HEMOGLOBIN A1C: Hgb A1c MFr Bld: 5.6 % (ref 4.6–6.5)

## 2013-12-13 NOTE — Telephone Encounter (Signed)
Message copied by Excell Seltzer on Fri Dec 13, 2013  8:19 AM ------      Message from: Alvina Chou      Created: Tue Dec 03, 2013  4:23 PM      Regarding: Lab orders for Friday, 8.28.15       Patient is scheduled for CPX labs, please order future labs, Thanks , Terri       ------

## 2013-12-20 ENCOUNTER — Encounter: Payer: Medicare Other | Admitting: Family Medicine

## 2013-12-24 ENCOUNTER — Other Ambulatory Visit: Payer: Self-pay | Admitting: Family Medicine

## 2014-01-07 ENCOUNTER — Encounter: Payer: Self-pay | Admitting: Family Medicine

## 2014-01-07 ENCOUNTER — Other Ambulatory Visit: Payer: Self-pay | Admitting: Family Medicine

## 2014-01-07 ENCOUNTER — Ambulatory Visit (INDEPENDENT_AMBULATORY_CARE_PROVIDER_SITE_OTHER): Payer: Medicare Other | Admitting: Family Medicine

## 2014-01-07 VITALS — BP 112/80 | HR 66 | Temp 98.2°F | Ht 60.25 in | Wt 167.8 lb

## 2014-01-07 DIAGNOSIS — F329 Major depressive disorder, single episode, unspecified: Secondary | ICD-10-CM

## 2014-01-07 DIAGNOSIS — Z23 Encounter for immunization: Secondary | ICD-10-CM

## 2014-01-07 DIAGNOSIS — E785 Hyperlipidemia, unspecified: Secondary | ICD-10-CM

## 2014-01-07 DIAGNOSIS — F3289 Other specified depressive episodes: Secondary | ICD-10-CM

## 2014-01-07 DIAGNOSIS — Z0000 Encounter for general adult medical examination without abnormal findings: Secondary | ICD-10-CM

## 2014-01-07 DIAGNOSIS — E119 Type 2 diabetes mellitus without complications: Secondary | ICD-10-CM

## 2014-01-07 DIAGNOSIS — I10 Essential (primary) hypertension: Secondary | ICD-10-CM

## 2014-01-07 LAB — HM DIABETES FOOT EXAM

## 2014-01-07 NOTE — Progress Notes (Signed)
Pre visit review using our clinic review tool, if applicable. No additional management support is needed unless otherwise documented below in the visit note. 

## 2014-01-07 NOTE — Assessment & Plan Note (Signed)
Well controlled. Continue current medication. Encouraged exercise, weight loss, healthy eating habits.  

## 2014-01-07 NOTE — Progress Notes (Signed)
I have personally reviewed the Medicare Annual Wellness questionnaire and have noted  1. The patient's medical and social history  2. Their use of alcohol, tobacco or illicit drugs  3. Their current medications and supplements  4. The patient's functional ability including ADL's, fall risks, home safety risks and hearing or visual  impairment.  5. Diet and physical activities  6. Evidence for depression or mood disorders  The patients weight, height, BMI and visual acuity have been recorded in the chart  I have made referrals, counseling and provided education to the patient based review of the above and I have provided the pt with a written personalized care plan for preventive services.    She passed a kidney stone in august. Followed by urologist: Dr. Patsi Sears.  Memory loss, post concussive headache  Neurologist, Dr. Malvin Johns. Dx with migraine.. Started on propranolol... Headaches are now less frequent and less intense.  Sleep disorder improved with nortriptyline.  She feels that her memory is improved. Also low B12, now treated.  She saw Dr. Malvin Johns last week on aricept 5 mg daily, tolerating well.  Hypertension: Well controlled on lisinopril and HCTZ ... On the low side today  BP Readings from Last 3 Encounters:  01/07/14 112/80  08/01/13 109/68  06/18/13 149/81  Using medication without problems or lightheadedness: Yes  Chest pain with exertion:None  Edema:None  Short of breath:None  Average home BPs: well controlled now that on propranolol.  Other issues:   Diabetes: Controlled with diet and lifestyle , on no medication.  Lab Results  Component Value Date   HGBA1C 5.6 12/13/2013  Hypoglycemic episodes:None  Hyperglycemic episodes:None  Feet problems:None  Blood Sugars averaging:not checking eye exam within last year: due  Wt Readings from Last 3 Encounters:  01/07/14 167 lb 12 oz (76.091 kg)  06/18/13 175 lb (79.379 kg)  12/18/12 155 lb 12 oz (70.648 kg)     Elevated Cholesterol: Previously at goal LDL <100 on lipitor 80 mg. Lab Results  Component Value Date   CHOL 155 12/13/2013   HDL 55.90 12/13/2013   LDLCALC 81 12/13/2013   LDLDIRECT 214.9 12/14/2012   TRIG 92.0 12/13/2013   CHOLHDL 3 12/13/2013  Using medications without problems: None  Muscle aches: None  Exercise: occ walking Diet: healthy, low carb.   Depression: Improved control on prozac, sleeping better at night on amitriptyline.   Anxiety well controlled. No SI, o HI.  Staying active.   Review of Systems  Constitutional: Negative for fever, but does have some fatigue (keeping 4 grandchildren)  HENT: Negative for ear pain.  Eyes: Negative for pain.  Respiratory: Negative for chest tightness and shortness of breath.  Cardiovascular: Negative for chest pain, palpitations and leg swelling.  Gastrointestinal: Negative for abdominal pain. No D/C, some constipation with nortriptyline.  Genitourinary: Negative for dysuria.  Objective:   Physical Exam  Constitutional: Vital signs are normal. She appears well-developed and well-nourished. She is cooperative. Non-toxic appearance. She does not appear ill. No distress.  HENT:  Head: Normocephalic.  Right Ear: Hearing, tympanic membrane, external ear and ear canal normal.  Left Ear: Hearing, tympanic membrane, external ear and ear canal normal.  Nose: Nose normal.  Eyes: Conjunctivae, EOM and lids are normal. Pupils are equal, round, and reactive to light. No foreign bodies found.  Neck: Trachea normal and normal range of motion. Neck supple. Carotid bruit is not present. No mass and no thyromegaly present.  Cardiovascular: Normal rate, regular rhythm, S1 normal, S2 normal, normal heart  sounds and intact distal pulses. Exam reveals no gallop.  No murmur heard.  Pulmonary/Chest: Effort normal and breath sounds normal. No respiratory distress. She has no wheezes. She has no rhonchi. She has no rales.  Abdominal: Soft. Normal  appearance and bowel sounds are normal. She exhibits no distension, no fluid wave, no abdominal bruit and no mass. There is no hepatosplenomegaly. There is no tenderness. There is no rebound, no guarding and no CVA tenderness. No hernia.  Genitourinary: Not performed this year.  Lymphadenopathy:  She has no cervical adenopathy.  She has no axillary adenopathy.  Neurological: She is alert. She has normal strength. No cranial nerve deficit or sensory deficit.  Skin: Skin is warm, dry and intact. No rash noted.  Psychiatric: Her speech is normal and behavior is normal. Judgment normal. Her mood appears not anxious. Cognition and memory are normal. She does not exhibit a depressed mood.  Diabetic foot exam:  Normal inspection  No skin breakdown  No calluses  Normal DP pulses  Normal sensation to light touch and monofilament  Nails normal   ASSESSMENT AND PLAN:  The patient's preventative maintenance and recommended screening tests for an annual wellness exam were reviewed in full today.  Brought up to date unless services declined.  Counselled on the importance of diet, exercise, and its role in overall health and mortality.  The patient's FH and SH was reviewed, including their home life, tobacco status, and drug and alcohol status.   Vaccines:Uptodate with TD and PNA, shingles vaccine not covered by insurance.  Given flu, Prevnar. Colon: Last colonoscopy 2002 Dr. Eliberto Ivory in [place], [place]. Was due in 2012.  Refused ifob and colonsocpy this year. Voiced understanding of risks.  Will reconsider next year. DVE/pap: partial hysterectomy, has both ovaries. Asymptomatic, no family history. Nml DVE 2012, not interested in further DVE unless symptomatic.  Mammo: nml 2013, due  DEXA: Refuses.  No family history.

## 2014-01-07 NOTE — Assessment & Plan Note (Signed)
Well controlled on no med. 

## 2014-01-07 NOTE — Patient Instructions (Addendum)
Schedule mammogram on your own. Work on [employer], regular exercise and weight loss.  Fat and Cholesterol Control Diet Fat and cholesterol levels in your blood and organs are influenced by your diet. High levels of fat and cholesterol may lead to diseases of the heart, small and large blood vessels, gallbladder, liver, and pancreas. CONTROLLING FAT AND CHOLESTEROL WITH DIET Although exercise and lifestyle factors are important, your diet is key. That is because certain foods are known to raise cholesterol and others to lower it. The goal is to balance foods for their effect on cholesterol and more importantly, to replace saturated and trans fat with other types of fat, such as monounsaturated fat, polyunsaturated fat, and omega-3 fatty acids. On average, a person should consume no more than 15 to 17 g of saturated fat daily. Saturated and trans fats are considered "bad" fats, and they will raise LDL cholesterol. Saturated fats are primarily found in animal products such as meats, butter, and cream. However, that does not mean you need to give up all your favorite foods. Today, there are good tasting, low-fat, low-cholesterol substitutes for most of the things you like to eat. Choose low-fat or nonfat alternatives. Choose round or loin cuts of red meat. These types of cuts are lowest in fat and cholesterol. Chicken (without the skin), fish, veal, and ground [place] breast are great choices. Eliminate fatty meats, such as hot dogs and salami. Even shellfish have little or no saturated fat. Have a 3 oz (85 g) portion when you eat lean meat, poultry, or fish. Trans fats are also called "partially hydrogenated oils." They are oils that have been scientifically manipulated so that they are solid at room temperature resulting in a longer shelf life and improved taste and texture of foods in which they are added. Trans fats are found in stick margarine, some tub margarines, cookies, crackers, and baked goods.   When baking and cooking, oils are a great substitute for butter. The monounsaturated oils are especially beneficial since it is believed they lower LDL and raise HDL. The oils you should avoid entirely are saturated tropical oils, such as coconut and palm.  Remember to eat a lot from food groups that are naturally free of saturated and trans fat, including fish, fruit, vegetables, beans, grains (barley, rice, couscous, bulgur wheat), and pasta (without cream sauces).  IDENTIFYING FOODS THAT LOWER FAT AND CHOLESTEROL  Soluble fiber may lower your cholesterol. This type of fiber is found in fruits such as apples, vegetables such as broccoli, potatoes, and carrots, legumes such as beans, peas, and lentils, and grains such as barley. Foods fortified with plant sterols (phytosterol) may also lower cholesterol. You should eat at least 2 g per day of these foods for a cholesterol lowering effect.  Read package labels to identify low-saturated fats, trans fat free, and low-fat foods at the supermarket. Select cheeses that have only 2 to 3 g saturated fat per ounce. Use a heart-healthy tub margarine that is free of trans fats or partially hydrogenated oil. When buying baked goods (cookies, crackers), avoid partially hydrogenated oils. Breads and muffins should be made from whole grains (whole-wheat or whole oat flour, instead of "flour" or "enriched flour"). Buy non-creamy canned soups with reduced salt and no added fats.  FOOD PREPARATION TECHNIQUES  Never deep-fry. If you must fry, either stir-fry, which uses very little fat, or use non-stick cooking sprays. When possible, broil, bake, or roast meats, and steam vegetables. Instead of putting butter or margarine on  vegetables, use lemon and herbs, applesauce, and cinnamon (for squash and sweet potatoes). Use nonfat yogurt, salsa, and low-fat dressings for salads.  LOW-SATURATED FAT / LOW-FAT FOOD SUBSTITUTES Meats / Saturated Fat (g)  Avoid: Steak, marbled (3  oz/85 g) / 11 g  Choose: Steak, lean (3 oz/85 g) / 4 g  Avoid: Hamburger (3 oz/85 g) / 7 g  Choose: Hamburger, lean (3 oz/85 g) / 5 g  Avoid: Ham (3 oz/85 g) / 6 g  Choose: Ham, lean cut (3 oz/85 g) / 2.4 g  Avoid: Chicken, with skin, dark meat (3 oz/85 g) / 4 g  Choose: Chicken, skin removed, dark meat (3 oz/85 g) / 2 g  Avoid: Chicken, with skin, light meat (3 oz/85 g) / 2.5 g  Choose: Chicken, skin removed, light meat (3 oz/85 g) / 1 g Dairy / Saturated Fat (g)  Avoid: Whole milk (1 cup) / 5 g  Choose: Low-fat milk, 2% (1 cup) / 3 g  Choose: Low-fat milk, 1% (1 cup) / 1.5 g  Choose: Skim milk (1 cup) / 0.3 g  Avoid: Hard cheese (1 oz/28 g) / 6 g  Choose: Skim milk cheese (1 oz/28 g) / 2 to 3 g  Avoid: Cottage cheese, 4% fat (1 cup) / 6.5 g  Choose: Low-fat cottage cheese, 1% fat (1 cup) / 1.5 g  Avoid: Ice cream (1 cup) / 9 g  Choose: Sherbet (1 cup) / 2.5 g  Choose: Nonfat frozen yogurt (1 cup) / 0.3 g  Choose: Frozen fruit bar / trace  Avoid: Whipped cream (1 tbs) / 3.5 g  Choose: Nondairy whipped topping (1 tbs) / 1 g Condiments / Saturated Fat (g)  Avoid: Mayonnaise (1 tbs) / 2 g  Choose: Low-fat mayonnaise (1 tbs) / 1 g  Avoid: Butter (1 tbs) / 7 g  Choose: Extra light margarine (1 tbs) / 1 g  Avoid: Coconut oil (1 tbs) / 11.8 g  Choose: Olive oil (1 tbs) / 1.8 g  Choose: Corn oil (1 tbs) / 1.7 g  Choose: Safflower oil (1 tbs) / 1.2 g  Choose: Sunflower oil (1 tbs) / 1.4 g  Choose: Soybean oil (1 tbs) / 2.4 g  Choose: Canola oil (1 tbs) / 1 g Document Released: 04/04/2005 Document Revised: 07/30/2012 Document Reviewed: 07/03/2013 Exit[employer] Patient Information 2015 [place]. This information is not intended to replace advice given to you by your health care provider. Make sure you discuss any questions you have with your health care provider.

## 2014-01-07 NOTE — Assessment & Plan Note (Signed)
Well controlled. Continue current medication.  

## 2014-01-07 NOTE — Assessment & Plan Note (Signed)
Well controlled. Continue current medication. Prozac.

## 2014-01-21 ENCOUNTER — Other Ambulatory Visit: Payer: Self-pay | Admitting: Family Medicine

## 2014-01-21 ENCOUNTER — Telehealth: Payer: Self-pay

## 2014-01-21 NOTE — Telephone Encounter (Signed)
Pt stated that she had an eye exam done last year at Dr[facility] Teod[facility]  Tried calling the office.  They are currently at lunch until 2pm.  Will try again later.

## 2014-02-14 NOTE — Telephone Encounter (Signed)
Called office for a copy of exam. Awaiting fax.    

## 2014-03-20 ENCOUNTER — Ambulatory Visit (INDEPENDENT_AMBULATORY_CARE_PROVIDER_SITE_OTHER): Payer: Medicare Other | Admitting: Family Medicine

## 2014-03-20 ENCOUNTER — Encounter: Payer: Self-pay | Admitting: Family Medicine

## 2014-03-20 DIAGNOSIS — R300 Dysuria: Secondary | ICD-10-CM | POA: Insufficient documentation

## 2014-03-20 LAB — POCT URINALYSIS DIPSTICK
Bilirubin, UA: NEGATIVE
Glucose, UA: NEGATIVE
KETONES UA: NEGATIVE
Leukocytes, UA: NEGATIVE
Nitrite, UA: NEGATIVE
Protein, UA: NEGATIVE
RBC UA: NEGATIVE
UROBILINOGEN UA: 0.2
pH, UA: 6

## 2014-03-20 NOTE — Patient Instructions (Signed)
Urine looking ok today. Continue avoiding bladder irritants like caffeine. Continue drinking plenty of water. Consider drinking 1 glass of cranberry juice daily for next few days. We will send culture to ensure no infection. Let us know if fever or persistent symptoms despite above.

## 2014-03-20 NOTE — Progress Notes (Signed)
Pre visit review using our clinic review tool, if applicable. No additional management support is needed unless otherwise documented below in the visit note. 

## 2014-03-20 NOTE — Progress Notes (Signed)
BP 130/84 mmHg  Pulse 60  Temp(Src) 97.6 F (36.4 C) (Oral)  Wt 168 lb (76.204 kg)   CC: UTI?  Subjective:    Patient ID: Crystal Newton, female    DOB: 1945-06-29, 68 y.o.   MRN: [id number]  HPI: Crystal CossJoyce S Vertz is a 68 y.o. female presenting on 03/20/2014 for Urinary Tract Infection   5 day history of dysuria along with left lower back pain. Some nausea associated with this. + urgency (new). No fevers/chills, hematuria, abd pain, frequency.  H/o kidney stones in the past. Last UTI was 05/2013. No recent abx use. Diabetic.  Lab Results  Component Value Date   HGBA1C 5.6 12/13/2013    Denies caffeine or soft drink use. Drinks plenty of water daily.  Relevant past medical, surgical, family and social history reviewed and updated as indicated. Interim medical history since our last visit reviewed. Allergies and medications reviewed and updated.  Current Outpatient Prescriptions on File Prior to Visit  Medication Sig  . aspirin 81 MG tablet Take 81 mg by mouth daily.    Marland Kitchen. atorvastatin (LIPITOR) 80 MG tablet TAKE 1 TABLET (80 MG TOTAL) BY MOUTH DAILY.  Marland Kitchen. donepezil (ARICEPT) 5 MG tablet Take 5 mg by mouth at bedtime.  Marland Kitchen. esomeprazole (NEXIUM) 40 MG capsule Take 40 mg by mouth daily at 12 noon.  Marland Kitchen. FLUoxetine (PROZAC) 40 MG capsule TAKE 1 CAPSULE (40 MG TOTAL) BY MOUTH DAILY.  Marland Kitchen. lisinopril (PRINIVIL,ZESTRIL) 5 MG tablet TAKE 1 TABLET (5 MG TOTAL) BY MOUTH DAILY.  . Multiple Vitamins-Minerals (CENTRUM SILVER PO) Take 1 tablet by mouth daily.  . nortriptyline (PAMELOR) 10 MG capsule Take 30 mg by mouth at bedtime.   . propranolol (INDERAL) 40 MG tablet Take 40 mg by mouth 2 (two) times daily.   No current facility-administered medications on file prior to visit.    Review of Systems Per HPI unless specifically indicated above     Objective:    BP 130/84 mmHg  Pulse 60  Temp(Src) 97.6 F (36.4 C) (Oral)  Wt 168 lb (76.204 kg)  Wt Readings from Last 3 Encounters:  03/20/14 168  lb (76.204 kg)  01/07/14 167 lb 12 oz (76.091 kg)  06/18/13 175 lb (79.379 kg)    Physical Exam  Constitutional: She appears well-developed and well-nourished. No distress.  Abdominal: Soft. Normal appearance and bowel sounds are normal. She exhibits no distension and no mass. There is no hepatosplenomegaly. There is no tenderness. There is no rigidity, no rebound, no guarding, no CVA tenderness and negative Murphy's sign.  Musculoskeletal: She exhibits no edema.  Nursing note and vitals reviewed.  Results for orders placed or performed in visit on 03/20/14  POCT Urinalysis Dipstick  Result Value Ref Range   Color, UA Yellow    Clarity, UA Clear    Glucose, UA Negative    Bilirubin, UA Negative    Ketones, UA Negative    Spec Grav, UA >=1.030    Blood, UA Negative    pH, UA 6.0    Protein, UA Negative    Urobilinogen, UA 0.2    Nitrite, UA Negative    Leukocytes, UA Negative       Assessment & Plan:   Problem List Items Addressed This Visit    Burning with urination    UA today normal, but pt gives stood story for UTI - will send UCx to r/o infection. In interim, rec increased water, continue avoiding bladder irritants. Will call with UCx  results. Pt agrees with plan.    Relevant Orders      POCT Urinalysis Dipstick (Completed)      Urine culture       Follow up plan: Return if symptoms worsen or fail to improve.

## 2014-03-20 NOTE — Assessment & Plan Note (Addendum)
UA today normal, but pt gives stood story for UTI - will send UCx to r/o infection. In interim, rec increased water, continue avoiding bladder irritants. Will call with UCx results. Pt agrees with plan.

## 2014-03-23 LAB — URINE CULTURE: Colony Count: 75000

## 2014-03-24 ENCOUNTER — Other Ambulatory Visit: Payer: Self-pay | Admitting: Family Medicine

## 2014-03-24 MED ORDER — CIPROFLOXACIN HCL 250 MG PO TABS: 250.0000 mg | ORAL_TABLET | Freq: Two times a day (BID) | ORAL | Status: DC

## 2014-03-31 ENCOUNTER — Telehealth: Payer: Self-pay

## 2014-03-31 MED ORDER — CIPROFLOXACIN HCL 250 MG PO TABS: 500.0000 mg | ORAL_TABLET | Freq: Two times a day (BID) | ORAL | Status: DC

## 2014-03-31 NOTE — Telephone Encounter (Signed)
Pt was seen on 03/20/14 and pt finished cipro after 5 days; pt continues with burning upon urination,pt voids small amts and left lower back pain.Pt feels weak. No fever or frequency of urine.Pt request antibiotic to [facility]. Pt request cb.

## 2014-03-31 NOTE — Telephone Encounter (Signed)
plz notify I've sent in another 7d antibiotic course. If worsening despite antibiotics please return to see us.

## 2014-04-01 NOTE — Telephone Encounter (Signed)
Patient notified as instructed by telephone. Patient verbalized understanding. 

## 2014-04-04 ENCOUNTER — Other Ambulatory Visit: Payer: Self-pay | Admitting: Family Medicine

## 2014-07-01 ENCOUNTER — Telehealth: Payer: Self-pay | Admitting: Family Medicine

## 2014-07-01 ENCOUNTER — Other Ambulatory Visit (INDEPENDENT_AMBULATORY_CARE_PROVIDER_SITE_OTHER): Payer: Medicare Other

## 2014-07-01 DIAGNOSIS — E119 Type 2 diabetes mellitus without complications: Secondary | ICD-10-CM

## 2014-07-01 LAB — COMPREHENSIVE METABOLIC PANEL
ALBUMIN: 4.5 g/dL (ref 3.5–5.2)
ALT: 25 U/L (ref 0–35)
AST: 21 U/L (ref 0–37)
Alkaline Phosphatase: 105 U/L (ref 39–117)
BILIRUBIN TOTAL: 0.6 mg/dL (ref 0.2–1.2)
BUN: 25 mg/dL — ABNORMAL HIGH (ref 6–23)
CO2: 31 meq/L (ref 19–32)
Calcium: 9.5 mg/dL (ref 8.4–10.5)
Chloride: 104 mEq/L (ref 96–112)
Creatinine, Ser: 0.68 mg/dL (ref 0.40–1.20)
GFR: 91.18 mL/min (ref >60.00)
Glucose, Bld: 94 mg/dL (ref 70–99)
Potassium: 4.5 mEq/L (ref 3.5–5.1)
SODIUM: 139 meq/L (ref 135–145)
TOTAL PROTEIN: 6.7 g/dL (ref 6.0–8.3)

## 2014-07-01 LAB — HEMOGLOBIN A1C: Hgb A1c MFr Bld: 5.8 % (ref 4.6–6.5)

## 2014-07-01 LAB — LIPID PANEL
Cholesterol: 172 mg/dL (ref 0–200)
HDL: 60.7 mg/dL (ref >39.00)
LDL Cholesterol: 85 mg/dL (ref 0–99)
NONHDL: 111.3
TRIGLYCERIDES: 133 mg/dL (ref 0.0–149.0)
Total CHOL/HDL Ratio: 3
VLDL: 26.6 mg/dL (ref 0.0–40.0)

## 2014-07-01 NOTE — Telephone Encounter (Signed)
-----   Message from Alvina Chouerri J Walsh sent at 06/25/2014  4:15 PM EST ----- Regarding: Lab orders for Tuesday, 3.15.16 Lab orders for a 6 month f/u

## 2014-07-08 ENCOUNTER — Ambulatory Visit (INDEPENDENT_AMBULATORY_CARE_PROVIDER_SITE_OTHER): Payer: Medicare Other | Admitting: Family Medicine

## 2014-07-08 ENCOUNTER — Encounter: Payer: Self-pay | Admitting: Family Medicine

## 2014-07-08 VITALS — BP 100/70 | HR 63 | Temp 98.5°F | Ht 60.25 in | Wt 170.0 lb

## 2014-07-08 DIAGNOSIS — E785 Hyperlipidemia, unspecified: Secondary | ICD-10-CM

## 2014-07-08 DIAGNOSIS — E119 Type 2 diabetes mellitus without complications: Secondary | ICD-10-CM

## 2014-07-08 DIAGNOSIS — I10 Essential (primary) hypertension: Secondary | ICD-10-CM | POA: Diagnosis not present

## 2014-07-08 LAB — HM DIABETES FOOT EXAM

## 2014-07-08 NOTE — Progress Notes (Signed)
69 year old female presents for 6 month follow up  Memory loss, post concussive headache  [facility]rolog[facility]... [facility] she is stable. Dx with migraine.. Started on propranolol... Headaches are now less frequent and less intense.  Sleep disorder improved with nortriptyline.  She feels that her memory is improved. Also low B12, now treated.  On aricept 5 mg daily, tolerating well.   She has noted significant sweating with exertion, just head. May be secondary to nortryptiline. That med not helping very much any way.   Hypertension: Well controlled on lisinopril and HCTZ . BP Readings from Last 3 Encounters:  07/08/14 100/70  03/20/14 130/84  01/07/14 112/80  Occ when she gets up fast she feels lightheaded. Chest pain with exertion:None  Edema:None  Short of breath:None  Average home BPs: not checking  Other issues:   Diabetes: Controlled with diet and lifestyle , on no medication.  Lab Results  Component Value Date   HGBA1C 5.8 07/01/2014   Hyperglycemic episodes:None  Feet problems:None  Blood Sugars averaging:not checking eye exam within last year:  due  Wt Readings from Last 3 Encounters:  07/08/14 170 lb (77.111 kg)  03/20/14 168 lb (76.204 kg)  01/07/14 167 lb 12 oz (76.091 kg)    Elevated Cholesterol: At goal LDL <100 on lipitor 80 mg. Lab Results  Component Value Date   CHOL 172 07/01/2014   HDL 60.70 07/01/2014   LDLCALC 85 07/01/2014   LDLDIRECT 214.9 12/14/2012   TRIG 133.0 07/01/2014   CHOLHDL 3 07/01/2014  Using medications without problems: None  Muscle aches: None  Exercise: occ walking Diet: healthy, low carb.    Review of Systems  Constitutional: Negative for fever, but does have some fatigue (keeping 4 grandchildren)  HENT: Negative for ear pain.  Eyes: Negative for pain.  Respiratory: Negative for chest tightness and shortness of breath.  Cardiovascular: Negative for chest pain, palpitations and leg  swelling.  Gastrointestinal: Negative for abdominal pain. No D/C, some constipation with nortriptyline.  Genitourinary: Negative for dysuria.  Objective:   Physical Exam  Constitutional: Vital signs are normal. She appears well-developed and well-nourished. She is cooperative. Non-toxic appearance. She does not appear ill. No distress.  HENT:  Head: Normocephalic.  Right Ear: Hearing, tympanic membrane, external ear and ear canal normal.  Left Ear: Hearing, tympanic membrane, external ear and ear canal normal.  Nose: Nose normal.  Eyes: Conjunctivae, EOM and lids are normal. Pupils are equal, round, and reactive to light. No foreign bodies found.  Neck: Trachea normal and normal range of motion. Neck supple. Carotid bruit is not present. No mass and no thyromegaly present.  Cardiovascular: Normal rate, regular rhythm, S1 normal, S2 normal, normal heart sounds and intact distal pulses. Exam reveals no gallop.  No murmur heard.  Pulmonary/Chest: Effort normal and breath sounds normal. No respiratory distress. She has no wheezes. She has no rhonchi. She has no rales.  Abdominal: Soft. Normal appearance and bowel sounds are normal. She exhibits no distension, no fluid wave, no abdominal bruit and no mass. There is no hepatosplenomegaly. There is no tenderness. There is no rebound, no guarding and no CVA tenderness. No hernia.  Genitourinary: Not performed this year.  Lymphadenopathy:  She has no cervical adenopathy.  She has no axillary adenopathy.  Neurological: She is alert. She has normal strength. No cranial nerve deficit or sensory deficit.  Skin: Skin is warm, dry and intact. No rash noted.  Psychiatric: Her speech is normal and behavior  is normal. Judgment normal. Her mood appears not anxious. Cognition and memory are normal. She does not exhibit a depressed mood.   Diabetic foot exam:  Normal inspection  No skin breakdown  No calluses  Normal DP pulses   Normal sensation to light touch and monofilament  Nails normal

## 2014-07-08 NOTE — Patient Instructions (Addendum)
Try to decrease nortryptiline to 20 mg daily to see if sweating is less. IF not decrease to 10 mg at bedtime x 1-2 weeks then off.  Make sure to have yearly eye exam. Increase exercise as tolerated.

## 2014-07-08 NOTE — Assessment & Plan Note (Signed)
Well controlled on no med. Encouraged exercise, weight loss, healthy eating habits.   

## 2014-07-08 NOTE — Progress Notes (Signed)
Pre visit review using our clinic review tool, if applicable. No additional management support is needed unless otherwise documented below in the visit note. 

## 2014-07-08 NOTE — Assessment & Plan Note (Signed)
Well controlled. Continue current medication.  

## 2014-08-08 ENCOUNTER — Other Ambulatory Visit: Payer: Self-pay | Admitting: Family Medicine

## 2014-08-11 ENCOUNTER — Other Ambulatory Visit: Payer: Self-pay | Admitting: Family Medicine

## 2014-09-20 ENCOUNTER — Other Ambulatory Visit: Payer: Self-pay | Admitting: Family Medicine

## 2014-11-06 ENCOUNTER — Telehealth: Payer: Self-pay

## 2014-11-06 ENCOUNTER — Encounter: Payer: Self-pay | Admitting: Family Medicine

## 2014-11-06 NOTE — Telephone Encounter (Signed)
Called patient to notify her of being due for a mammogram. Patient declined any help scheduling one at the moment. 

## 2014-11-06 NOTE — Telephone Encounter (Signed)
Left a message for patient to return my call about having a Mammogram set up. Will await call back.  

## 2014-12-15 ENCOUNTER — Other Ambulatory Visit: Payer: Self-pay | Admitting: Family Medicine

## 2015-01-29 ENCOUNTER — Telehealth: Payer: Self-pay | Admitting: Family Medicine

## 2015-01-29 ENCOUNTER — Encounter (INDEPENDENT_AMBULATORY_CARE_PROVIDER_SITE_OTHER): Payer: Self-pay

## 2015-01-29 ENCOUNTER — Other Ambulatory Visit (INDEPENDENT_AMBULATORY_CARE_PROVIDER_SITE_OTHER): Payer: Medicare Other

## 2015-01-29 DIAGNOSIS — E119 Type 2 diabetes mellitus without complications: Secondary | ICD-10-CM | POA: Diagnosis not present

## 2015-01-29 LAB — LIPID PANEL
Cholesterol: 169 mg/dL (ref 0–200)
HDL: 58.3 mg/dL (ref >39.00)
LDL CALC: 90 mg/dL (ref 0–99)
NonHDL: 110.59
TRIGLYCERIDES: 104 mg/dL (ref 0.0–149.0)
Total CHOL/HDL Ratio: 3
VLDL: 20.8 mg/dL (ref 0.0–40.0)

## 2015-01-29 LAB — COMPREHENSIVE METABOLIC PANEL
ALT: 23 U/L (ref 0–35)
AST: 23 U/L (ref 0–37)
Albumin: 4.2 g/dL (ref 3.5–5.2)
Alkaline Phosphatase: 84 U/L (ref 39–117)
BILIRUBIN TOTAL: 0.4 mg/dL (ref 0.2–1.2)
BUN: 15 mg/dL (ref 6–23)
CO2: 29 meq/L (ref 19–32)
CREATININE: 0.64 mg/dL (ref 0.40–1.20)
Calcium: 9.3 mg/dL (ref 8.4–10.5)
Chloride: 109 mEq/L (ref 96–112)
GFR: 97.62 mL/min (ref >60.00)
Glucose, Bld: 101 mg/dL — ABNORMAL HIGH (ref 70–99)
Potassium: 4.3 mEq/L (ref 3.5–5.1)
Sodium: 144 mEq/L (ref 135–145)
Total Protein: 6.4 g/dL (ref 6.0–8.3)

## 2015-01-29 LAB — HEMOGLOBIN A1C: Hgb A1c MFr Bld: 5.7 % (ref 4.6–6.5)

## 2015-01-29 NOTE — Telephone Encounter (Signed)
-----   Message from Alvina Chouerri J Walsh sent at 01/21/2015  5:49 PM EDT ----- Regarding: Lab orders for Thursday, 10.13.16 Patient is scheduled for CPX labs, please order future labs, Thanks , Camelia Engerri

## 2015-02-03 ENCOUNTER — Ambulatory Visit (INDEPENDENT_AMBULATORY_CARE_PROVIDER_SITE_OTHER): Payer: Medicare Other | Admitting: Family Medicine

## 2015-02-03 ENCOUNTER — Encounter: Payer: Self-pay | Admitting: Family Medicine

## 2015-02-03 VITALS — BP 112/76 | HR 60 | Temp 98.4°F | Ht 60.0 in | Wt 168.5 lb

## 2015-02-03 DIAGNOSIS — Z23 Encounter for immunization: Secondary | ICD-10-CM

## 2015-02-03 DIAGNOSIS — E785 Hyperlipidemia, unspecified: Secondary | ICD-10-CM | POA: Diagnosis not present

## 2015-02-03 DIAGNOSIS — Z0000 Encounter for general adult medical examination without abnormal findings: Secondary | ICD-10-CM | POA: Diagnosis not present

## 2015-02-03 DIAGNOSIS — F0390 Unspecified dementia without behavioral disturbance: Secondary | ICD-10-CM

## 2015-02-03 DIAGNOSIS — F321 Major depressive disorder, single episode, moderate: Secondary | ICD-10-CM

## 2015-02-03 DIAGNOSIS — Z1211 Encounter for screening for malignant neoplasm of colon: Secondary | ICD-10-CM

## 2015-02-03 DIAGNOSIS — I10 Essential (primary) hypertension: Secondary | ICD-10-CM | POA: Diagnosis not present

## 2015-02-03 DIAGNOSIS — E119 Type 2 diabetes mellitus without complications: Secondary | ICD-10-CM | POA: Diagnosis not present

## 2015-02-03 DIAGNOSIS — F03A0 Unspecified dementia, mild, without behavioral disturbance, psychotic disturbance, mood disturbance, and anxiety: Secondary | ICD-10-CM

## 2015-02-03 DIAGNOSIS — Z7189 Other specified counseling: Secondary | ICD-10-CM | POA: Insufficient documentation

## 2015-02-03 NOTE — Patient Instructions (Addendum)
Schedule yearly eye exam for DM. Schedule mammogram on your own. Stop at lab on way out for IFOb stool test.  Keep working on healthy eating and regular exercise.

## 2015-02-03 NOTE — Assessment & Plan Note (Signed)
Well controlled. Continue current medication.  

## 2015-02-03 NOTE — Assessment & Plan Note (Signed)
Stable control on current med. 

## 2015-02-03 NOTE — Assessment & Plan Note (Signed)
Well controlled with lifestyle changes. Encouraged exercise, weight loss, healthy eating habits.

## 2015-02-03 NOTE — Progress Notes (Signed)
Pre visit review using our clinic review tool, if applicable. No additional management support is needed unless otherwise documented below in the visit note. 

## 2015-02-03 NOTE — Assessment & Plan Note (Signed)
Stable on low dose aricept. Started by neuro.

## 2015-02-03 NOTE — Progress Notes (Signed)
I have personally reviewed the Medicare Annual Wellness questionnaire and have noted 1. The patient's medical and social history 2. Their use of alcohol, tobacco or illicit drugs 3. Their current medications and supplements 4. The patient's functional ability including ADL's, fall risks, home safety risks and hearing or visual             impairment. 5. Diet and physical activities 6. Evidence for depression or mood disorders 7.         Updated provider list Cognitive evaluation was performed and recorded on pt medicare questionnaire form. The patients weight, height, BMI and visual acuity have been recorded in the chart  I have made referrals, counseling and provided education to the patient based review of the above and I have provided the pt with a written personalized care plan for preventive services.   Memory loss, post concussive headache  Neurologist, Dr. Malvin JohnsPotter. Dx with migraine.. Started on propranolol... Headaches are now less frequent and less intense.  Sleep disorder improved with nortriptyline, now no longer on.  She feels that her memory is improved. Also low B12, now treated.  On aricept 5 mg daily, tolerating well.  Dr. Malvin JohnsPotter neuro is now out of network.  Hypertension: Well controlled on lisinopril.. BP Readings from Last 3 Encounters:  02/03/15 112/76  07/08/14 100/70  03/20/14 130/84  Chest pain with exertion:None  Edema:None  Short of breath:None  Average home BPs: well controlled now that on propranolol.  Other issues:   Diabetes: Controlled with diet and lifestyle , on no medication.  Lab Results  Component Value Date   HGBA1C 5.7 01/29/2015  Hypoglycemic episodes:None  Hyperglycemic episodes:None  Feet problems:None  Blood Sugars averaging:not checking eye exam within last year:  due  Wt Readings from Last 3 Encounters:  02/03/15 168 lb 8 oz (76.431 kg)  07/08/14 170 lb (77.111 kg)  03/20/14 168 lb (76.204 kg)   Elevated  Cholesterol: Previously at goal LDL <100 on lipitor 80 mg. Lab Results  Component Value Date   CHOL 169 01/29/2015   HDL 58.30 01/29/2015   LDLCALC 90 01/29/2015   LDLDIRECT 214.9 12/14/2012   TRIG 104.0 01/29/2015   CHOLHDL 3 01/29/2015  Using medications without problems: None  Muscle aches: None  Exercise: minimal walking Diet: healthy, low carb.   Depression: Improved control on prozac, sleeping better at night.   Anxiety well controlled. No SI, o HI.  Staying active.   Review of Systems  Constitutional: Negative for fever, but does have some fatigue (keeping 4 grandchildren) HENT: Negative for ear pain.  Eyes: Negative for pain.  Respiratory: Negative for chest tightness and shortness of breath.  Cardiovascular: Negative for chest pain, palpitations and leg swelling.  Gastrointestinal: Negative for abdominal pain. No D/C Genitourinary: Negative for dysuria.  Objective:   Physical Exam  Constitutional: Vital signs are normal. She appears well-developed and well-nourished. She is cooperative. Non-toxic appearance. She does not appear ill. No distress.  HENT:  Head: Normocephalic.  Right Ear: Hearing, tympanic membrane, external ear and ear canal normal.  Left Ear: Hearing, tympanic membrane, external ear and ear canal normal.  Nose: Nose normal.  Eyes: Conjunctivae, EOM and lids are normal. Pupils are equal, round, and reactive to light. No foreign bodies found.  Neck: Trachea normal and normal range of motion. Neck supple. Carotid bruit is not present. No mass and no thyromegaly present.  Cardiovascular: Normal rate, regular rhythm, S1 normal, S2 normal, normal heart sounds and intact distal pulses. Exam reveals  no gallop.  No murmur heard.  Pulmonary/Chest: Effort normal and breath sounds normal. No respiratory distress. She has no wheezes. She has no rhonchi. She has no rales.  Abdominal: Soft. Normal appearance and bowel sounds are normal. She  exhibits no distension, no fluid wave, no abdominal bruit and no mass. There is no hepatosplenomegaly. There is no tenderness. There is no rebound, no guarding and no CVA tenderness. No hernia.  Genitourinary: Not performed this year.  Lymphadenopathy:  She has no cervical adenopathy.  She has no axillary adenopathy.  Neurological: She is alert. She has normal strength. No cranial nerve deficit or sensory deficit.  Skin: Skin is warm, dry and intact. No rash noted.  Psychiatric: Her speech is normal and behavior is normal. Judgment normal. Her mood appears not anxious. Cognition and memory are normal. She does not exhibit a depressed mood.  Diabetic foot exam:  Normal inspection  No skin breakdown  No calluses  Normal DP pulses  Normal sensation to light touch and monofilament  Nails normal   ASSESSMENT AND PLAN:  The patient's preventative maintenance and recommended screening tests for an annual wellness exam were reviewed in full today.  Brought up to date unless services declined.  Counselled on the importance of diet, exercise, and its role in overall health and mortality.  The patient's FH and SH was reviewed, including their home life, tobacco status, and drug and alcohol status.   Vaccines: Uptodate with TD prevnar and pneumovax, shingles vaccine not covered by insurance. Given flu. Colon: Last colonoscopy 2002 Dr. Eliberto Ivory in [place], [place]. Was due in 2012.  She has agreed to [employer] yearly. DVE/pap: partial hysterectomy, has both ovaries. Asymptomatic, no family history. Nml DVE 2012, not interested in further DVE unless symptomatic.  Mammo: nml 2013, due  DEXA: Refuses. No family history. HEP C: refused.

## 2015-02-17 ENCOUNTER — Other Ambulatory Visit: Payer: Self-pay | Admitting: Family Medicine

## 2015-03-08 ENCOUNTER — Other Ambulatory Visit: Payer: Self-pay | Admitting: Family Medicine

## 2015-05-08 ENCOUNTER — Other Ambulatory Visit: Payer: Self-pay

## 2015-05-08 MED ORDER — PROPRANOLOL HCL 40 MG PO TABS: 40.0000 mg | ORAL_TABLET | Freq: Two times a day (BID) | ORAL | Status: DC

## 2015-05-08 NOTE — Telephone Encounter (Signed)
Left message that refill for Propanolol has been sent to her pharmacy.

## 2015-05-08 NOTE — Telephone Encounter (Signed)
Crystal Newton pts husband left note (do not see DPR signed) requesting refill propanolol to [facility]; Dr Malvin Johns had been prescribing to prevent migraines and pt no longer sees Dr Malvin Johns. Request cb after refilled. Last annual exam 02/03/15.Please advise.

## 2015-05-20 DIAGNOSIS — H25813 Combined forms of age-related cataract, bilateral: Secondary | ICD-10-CM | POA: Diagnosis not present

## 2015-05-20 DIAGNOSIS — H00025 Hordeolum internum left lower eyelid: Secondary | ICD-10-CM | POA: Diagnosis not present

## 2015-05-20 DIAGNOSIS — H00022 Hordeolum internum right lower eyelid: Secondary | ICD-10-CM | POA: Diagnosis not present

## 2015-05-20 DIAGNOSIS — H2513 Age-related nuclear cataract, bilateral: Secondary | ICD-10-CM | POA: Diagnosis not present

## 2015-05-20 DIAGNOSIS — E119 Type 2 diabetes mellitus without complications: Secondary | ICD-10-CM | POA: Diagnosis not present

## 2015-05-20 LAB — HM DIABETES EYE EXAM

## 2015-05-27 ENCOUNTER — Encounter: Payer: Self-pay | Admitting: Family Medicine

## 2015-05-31 ENCOUNTER — Other Ambulatory Visit: Payer: Self-pay | Admitting: Family Medicine

## 2015-06-16 ENCOUNTER — Ambulatory Visit (INDEPENDENT_AMBULATORY_CARE_PROVIDER_SITE_OTHER)
Admission: RE | Admit: 2015-06-16 | Discharge: 2015-06-16 | Disposition: A | Discharge status: Home / Self Care | Payer: Medicare Other | Source: Ambulatory Visit | Attending: Family Medicine | Admitting: Family Medicine

## 2015-06-16 ENCOUNTER — Encounter: Payer: Self-pay | Admitting: Family Medicine

## 2015-06-16 ENCOUNTER — Ambulatory Visit (INDEPENDENT_AMBULATORY_CARE_PROVIDER_SITE_OTHER): Payer: Medicare Other | Admitting: Family Medicine

## 2015-06-16 VITALS — BP 130/84 | HR 64 | Temp 98.6°F | Ht 60.0 in | Wt 160.8 lb

## 2015-06-16 DIAGNOSIS — J189 Pneumonia, unspecified organism: Secondary | ICD-10-CM

## 2015-06-16 DIAGNOSIS — R509 Fever, unspecified: Secondary | ICD-10-CM | POA: Diagnosis not present

## 2015-06-16 DIAGNOSIS — R05 Cough: Secondary | ICD-10-CM | POA: Diagnosis not present

## 2015-06-16 MED ORDER — LEVOFLOXACIN 500 MG PO TABS: 500.0000 mg | ORAL_TABLET | Freq: Every day | ORAL | Status: DC

## 2015-06-16 MED ORDER — GUAIFENESIN-CODEINE 100-10 MG/5ML PO SYRP: 5.0000 mL | ORAL_SOLUTION | Freq: Every evening | ORAL | Status: DC | PRN

## 2015-06-16 NOTE — Progress Notes (Signed)
Pre visit review using our clinic review tool, if applicable. No additional management support is needed unless otherwise documented below in the visit note. 

## 2015-06-16 NOTE — Assessment & Plan Note (Addendum)
Eval with X-ray for likely CAP. No hypoxia, no indication for hospitalization. Will start on levaquin  X 10 days. Cough suppressant at night.

## 2015-06-16 NOTE — Progress Notes (Signed)
   Subjective:    Patient ID: Crystal Newton, female    DOB: December 25, 1945, 70 y.o.   MRN: [id number]  Cough This is a new problem. The current episode started 1 to 4 weeks ago (10 days). The problem has been gradually worsening. The cough is productive of purulent sputum and productive of sputum. Associated symptoms include a fever, nasal congestion and shortness of breath. Pertinent negatives include no ear congestion, ear pain, headaches, rash, rhinorrhea, sore throat or wheezing. Associated symptoms comments: Subjective fever in beginning of illness  chest pain. The symptoms are aggravated by lying down (keeping up at night). Risk factors: nonsmoker. She has tried OTC cough suppressant for the symptoms. The treatment provided mild relief. There is no history of asthma, COPD or environmental allergies.    Sick contacts: grandson with flu  Social History /Family History/Past Medical History reviewed and updated if needed.   Review of Systems  Constitutional: Positive for fever.  HENT: Negative for ear pain, rhinorrhea and sore throat.   Respiratory: Positive for cough and shortness of breath. Negative for wheezing.   Skin: Negative for rash.  Allergic/Immunologic: Negative for environmental allergies.  Neurological: Negative for headaches.       Objective:   Physical Exam  Constitutional: Vital signs are normal. She appears well-developed and well-nourished. She is cooperative.  Non-toxic appearance. She appears ill. No distress.  HENT:  Head: Normocephalic.  Right Ear: Hearing, tympanic membrane, external ear and ear canal normal. Tympanic membrane is not erythematous, not retracted and not bulging.  Left Ear: Hearing, tympanic membrane, external ear and ear canal normal. Tympanic membrane is not erythematous, not retracted and not bulging.  Nose: No mucosal edema or rhinorrhea. Right sinus exhibits no maxillary sinus tenderness and no frontal sinus tenderness. Left sinus exhibits no  maxillary sinus tenderness and no frontal sinus tenderness.  Mouth/Throat: Uvula is midline, oropharynx is clear and moist and mucous membranes are normal.  Eyes: Conjunctivae, EOM and lids are normal. Pupils are equal, round, and reactive to light. Lids are everted and swept, no foreign bodies found.  Neck: Trachea normal and normal range of motion. Neck supple. Carotid bruit is not present. No thyroid mass and no thyromegaly present.  Cardiovascular: Normal rate, regular rhythm, S1 normal, S2 normal, normal heart sounds, intact distal pulses and normal pulses.  Exam reveals no gallop and no friction rub.   No murmur heard. Pulmonary/Chest: Effort normal. No tachypnea. No respiratory distress. She has no decreased breath sounds. She has no wheezes. She has rhonchi in the right lower field and the left lower field. She has no rales.  Abdominal: Soft. Normal appearance and bowel sounds are normal. There is no tenderness.  Neurological: She is alert.  Skin: Skin is warm, dry and intact. No rash noted.  Psychiatric: Her speech is normal and behavior is normal. Judgment and thought content normal. Her mood appears not anxious. Cognition and memory are normal. She does not exhibit a depressed mood.          Assessment & Plan:

## 2015-06-16 NOTE — Patient Instructions (Addendum)
Start cough suppressant at night  For cough. During the day mucinex DM. Complete antibiotics. Rest, fluids. We will call with X-ray results. Call if not improving some in 48-72 hours. If severe shortness of breath.. Go to ER.

## 2015-06-26 ENCOUNTER — Other Ambulatory Visit: Payer: Self-pay | Admitting: Family Medicine

## 2015-06-30 ENCOUNTER — Ambulatory Visit (INDEPENDENT_AMBULATORY_CARE_PROVIDER_SITE_OTHER): Payer: Medicare Other | Admitting: Family Medicine

## 2015-06-30 VITALS — BP 102/70 | HR 62 | Temp 98.4°F | Resp 18 | Ht 60.0 in | Wt 158.5 lb

## 2015-06-30 DIAGNOSIS — M7581 Other shoulder lesions, right shoulder: Secondary | ICD-10-CM

## 2015-06-30 DIAGNOSIS — J189 Pneumonia, unspecified organism: Secondary | ICD-10-CM

## 2015-06-30 NOTE — Progress Notes (Signed)
Pre visit review using our clinic review tool, if applicable. No additional management support is needed unless otherwise documented below in the visit note. 

## 2015-06-30 NOTE — Assessment & Plan Note (Signed)
?   If secondary to levaquin as trigger for tendonitis?  Now off Levaquin.  Start NSAID (but will use ibuprofen low dose, given hx of PUD on PPI), [facility]

## 2015-06-30 NOTE — Progress Notes (Signed)
Subjective:    Patient ID: Crystal Newton, female    DOB: [birth year], 70 y.o.   MRN: [id number]  HPI   70 year old female with hx of DM, HTN  presetns for follow up atypical PNA  She was seen on 2/28 for cough and SOB.  She was started on levaquin x 10 days  CXR 2/28: mild left base subsegmental atelectasis, no acute cardio pulmonary disease.  Today she reports productive cough has resolved, no further SOB. Mild chest tightness. No fever.   Now she is having pain in bilateral upper arms in muscle as well as in shoulder. Worse on right than on left.  Pain increases with any movement but most with lifting arm up.  She noted the ache there after stopping the medication. Mils neck stiffness and neck ache. Moving head does not cause pain down arms.  Tylenol did not help much.  No numbness or weakness in arms.      Review of Systems  Constitutional: Negative for fever and fatigue.  HENT: Negative for ear pain.   Eyes: Negative for pain.  Respiratory: Negative for chest tightness and shortness of breath.   Cardiovascular: Negative for chest pain, palpitations and leg swelling.  Gastrointestinal: Negative for abdominal pain.  Genitourinary: Negative for dysuria.       Objective:   Physical Exam  Constitutional: Vital signs are normal. She appears well-developed and well-nourished. She is cooperative.  Non-toxic appearance. She does not appear ill. No distress.  HENT:  Head: Normocephalic.  Right Ear: Hearing, tympanic membrane, external ear and ear canal normal. Tympanic membrane is not erythematous, not retracted and not bulging.  Left Ear: Hearing, tympanic membrane, external ear and ear canal normal. Tympanic membrane is not erythematous, not retracted and not bulging.  Nose: No mucosal edema or rhinorrhea. Right sinus exhibits no maxillary sinus tenderness and no frontal sinus tenderness. Left sinus exhibits no maxillary sinus tenderness and no frontal sinus tenderness.    Mouth/Throat: Uvula is midline, oropharynx is clear and moist and mucous membranes are normal.  Eyes: Conjunctivae, EOM and lids are normal. Pupils are equal, round, and reactive to light. Lids are everted and swept, no foreign bodies found.  Neck: Trachea normal and normal range of motion. Neck supple. Carotid bruit is not present. No thyroid mass and no thyromegaly present.  Cardiovascular: Normal rate, regular rhythm, S1 normal, S2 normal, normal heart sounds, intact distal pulses and normal pulses.  Exam reveals no gallop and no friction rub.   No murmur heard. Pulmonary/Chest: Effort normal and breath sounds normal. No tachypnea. No respiratory distress. She has no decreased breath sounds. She has no wheezes. She has no rhonchi. She has no rales.  Abdominal: Soft. Normal appearance and bowel sounds are normal. There is no tenderness.  Musculoskeletal:       Right shoulder: She exhibits decreased range of motion, tenderness and bony tenderness.       Left shoulder: She exhibits normal range of motion, no tenderness and no bony tenderness.       Cervical back: Normal. She exhibits normal range of motion, no tenderness and no bony tenderness.  Subacromial ttp in right posterior shoulder, Pos neer's on rihgt, neg on left  neg drop arm test bilaterally  no pain with flexing biceps or at bicipital groove  Neurological: She is alert. She has normal strength. No cranial nerve deficit or sensory deficit.  Skin: Skin is warm, dry and intact. No rash noted.  Psychiatric: Her  speech is normal and behavior is normal. Judgment and thought content normal. Her mood appears not anxious. Cognition and memory are normal. She does not exhibit a depressed mood.          Assessment & Plan:

## 2015-06-30 NOTE — Assessment & Plan Note (Signed)
Resolved. Now off levaquin.

## 2015-06-30 NOTE — Patient Instructions (Addendum)
Ibuprofen 600 mg every 8 hours for pain and inflammation over the next few das. If stomach irritation stop medication. Start home physical therapy. Call if next 2 weeks if not improving at all.

## 2015-07-24 ENCOUNTER — Telehealth: Payer: Self-pay | Admitting: Family Medicine

## 2015-07-24 ENCOUNTER — Other Ambulatory Visit (INDEPENDENT_AMBULATORY_CARE_PROVIDER_SITE_OTHER): Payer: Medicare Other

## 2015-07-24 DIAGNOSIS — E119 Type 2 diabetes mellitus without complications: Secondary | ICD-10-CM | POA: Diagnosis not present

## 2015-07-24 LAB — COMPREHENSIVE METABOLIC PANEL
ALBUMIN: 4.3 g/dL (ref 3.5–5.2)
ALT: 18 U/L (ref 0–35)
AST: 19 U/L (ref 0–37)
Alkaline Phosphatase: 83 U/L (ref 39–117)
BILIRUBIN TOTAL: 0.4 mg/dL (ref 0.2–1.2)
BUN: 20 mg/dL (ref 6–23)
CALCIUM: 9.5 mg/dL (ref 8.4–10.5)
CO2: 28 mEq/L (ref 19–32)
CREATININE: 0.55 mg/dL (ref 0.40–1.20)
Chloride: 108 mEq/L (ref 96–112)
GFR: 116.12 mL/min (ref >60.00)
Glucose, Bld: 117 mg/dL — ABNORMAL HIGH (ref 70–99)
Potassium: 4.1 mEq/L (ref 3.5–5.1)
SODIUM: 144 meq/L (ref 135–145)
Total Protein: 6.5 g/dL (ref 6.0–8.3)

## 2015-07-24 LAB — LIPID PANEL
CHOLESTEROL: 166 mg/dL (ref 0–200)
HDL: 63.2 mg/dL (ref >39.00)
LDL CALC: 84 mg/dL (ref 0–99)
NONHDL: 102.88
Total CHOL/HDL Ratio: 3
Triglycerides: 92 mg/dL (ref 0.0–149.0)
VLDL: 18.4 mg/dL (ref 0.0–40.0)

## 2015-07-24 LAB — HEMOGLOBIN A1C: HEMOGLOBIN A1C: 5.7 % (ref 4.6–6.5)

## 2015-07-24 NOTE — Telephone Encounter (Signed)
-----   Message from Alvina Chouerri J Walsh sent at 07/16/2015  5:27 PM EDT ----- Regarding: Lab orders for Friday,4.7.17 Lab orders for a 6 month follow up appt

## 2015-07-28 ENCOUNTER — Other Ambulatory Visit: Payer: Medicare Other

## 2015-08-02 ENCOUNTER — Other Ambulatory Visit: Payer: Self-pay | Admitting: Family Medicine

## 2015-08-04 ENCOUNTER — Ambulatory Visit (INDEPENDENT_AMBULATORY_CARE_PROVIDER_SITE_OTHER): Payer: Medicare Other | Admitting: Family Medicine

## 2015-08-04 ENCOUNTER — Encounter: Payer: Self-pay | Admitting: Family Medicine

## 2015-08-04 VITALS — BP 122/76 | HR 56 | Temp 98.5°F | Ht 60.0 in | Wt 159.8 lb

## 2015-08-04 DIAGNOSIS — I10 Essential (primary) hypertension: Secondary | ICD-10-CM

## 2015-08-04 DIAGNOSIS — F321 Major depressive disorder, single episode, moderate: Secondary | ICD-10-CM | POA: Diagnosis not present

## 2015-08-04 DIAGNOSIS — E119 Type 2 diabetes mellitus without complications: Secondary | ICD-10-CM | POA: Diagnosis not present

## 2015-08-04 DIAGNOSIS — E785 Hyperlipidemia, unspecified: Secondary | ICD-10-CM

## 2015-08-04 LAB — HM DIABETES FOOT EXAM

## 2015-08-04 NOTE — Assessment & Plan Note (Signed)
Stable control on current medication. 

## 2015-08-04 NOTE — Patient Instructions (Signed)
Keep working on healthy eating, exercise as you can and weight loss.

## 2015-08-04 NOTE — Assessment & Plan Note (Signed)
Well controlled. Continue current medication.  

## 2015-08-04 NOTE — Progress Notes (Signed)
Pre visit review using our clinic review tool, if applicable. No additional management support is needed unless otherwise documented below in the visit note. 

## 2015-08-04 NOTE — Progress Notes (Signed)
70 year old female presents for 6 month follow up   Hypertension: Well controlled on lisinopril and HCTZ . BP Readings from Last 3 Encounters:  08/04/15 122/76  06/30/15 102/70  06/16/15 130/84   Occ getting up to standing lightheaded. Chest pain with exertion:None  Edema:None  Short of breath:None  Average home BPs: not checking  Other issues:   Diabetes: Controlled with diet and lifestyle , on no medication.  Lab Results  Component Value Date   HGBA1C 5.7 07/24/2015  Hyperglycemic episodes:None  Feet problems:None  Blood Sugars averaging:not checking eye exam within last year: uptodate   Wt Readings from Last 3 Encounters:  08/04/15 159 lb 12 oz (72.462 kg)  06/30/15 158 lb 8 oz (71.895 kg)  06/16/15 160 lb 12 oz (72.916 kg)   Elevated Cholesterol: At goal LDL <100 on lipitor 80 mg. Lab Results  Component Value Date   CHOL 166 07/24/2015   HDL 63.20 07/24/2015   LDLCALC 84 07/24/2015   LDLDIRECT 214.9 12/14/2012   TRIG 92.0 07/24/2015   CHOLHDL 3 07/24/2015   Using medications without problems: None  Muscle aches: None  Exercise: occ walking Diet: healthy, low carb.   Depression, stable control on fluoxetine. denies insomnia and no SI.  Review of Systems  Constitutional: Negative for fever, but does have some fatigue (keeping 4 grandchildren)  HENT: Negative for ear pain.  Eyes: Negative for pain.  Respiratory: Negative for chest tightness and shortness of breath.  Cardiovascular: Negative for chest pain, palpitations and leg swelling.  Gastrointestinal: Negative for abdominal pain. No D/C, some constipation with nortriptyline.  Genitourinary: Negative for dysuria.  Objective:   Physical Exam  Constitutional: Vital signs are normal. She appears well-developed and well-nourished. She is cooperative. Non-toxic appearance. She does not appear ill. No distress.  HENT:  Head: Normocephalic.  Right Ear: Hearing, tympanic membrane,  external ear and ear canal normal.  Left Ear: Hearing, tympanic membrane, external ear and ear canal normal.  Nose: Nose normal.  Eyes: Conjunctivae, EOM and lids are normal. Pupils are equal, round, and reactive to light. No foreign bodies found.  Neck: Trachea normal and normal range of motion. Neck supple. Carotid bruit is not present. No mass and no thyromegaly present.  Cardiovascular: Normal rate, regular rhythm, S1 normal, S2 normal, normal heart sounds and intact distal pulses. Exam reveals no gallop.  No murmur heard.  Pulmonary/Chest: Effort normal and breath sounds normal. No respiratory distress. She has no wheezes. She has no rhonchi. She has no rales.  Abdominal: Soft. Normal appearance and bowel sounds are normal. She exhibits no distension, no fluid wave, no abdominal bruit and no mass. There is no hepatosplenomegaly. There is no tenderness. There is no rebound, no guarding and no CVA tenderness. No hernia.  Genitourinary: Not performed this year.  Lymphadenopathy:  She has no cervical adenopathy.  She has no axillary adenopathy.  Neurological: She is alert. She has normal strength. No cranial nerve deficit or sensory deficit.  Skin: Skin is warm, dry and intact. No rash noted.  Psychiatric: Her speech is normal and behavior is normal. Judgment normal. Her mood appears not anxious. Cognition and memory are normal. She does not exhibit a depressed mood.   Diabetic foot exam:  Normal inspection  No skin breakdown  No calluses  Normal DP pulses  Normal sensation to light touch and monofilament  Nails normal

## 2015-09-01 ENCOUNTER — Other Ambulatory Visit: Payer: Self-pay | Admitting: Family Medicine

## 2015-09-15 ENCOUNTER — Other Ambulatory Visit: Payer: Self-pay | Admitting: Family Medicine

## 2016-01-15 ENCOUNTER — Ambulatory Visit (INDEPENDENT_AMBULATORY_CARE_PROVIDER_SITE_OTHER): Payer: Medicare Other

## 2016-01-15 DIAGNOSIS — Z23 Encounter for immunization: Secondary | ICD-10-CM | POA: Diagnosis not present

## 2016-01-26 ENCOUNTER — Other Ambulatory Visit: Payer: Self-pay | Admitting: Family Medicine

## 2016-01-29 ENCOUNTER — Telehealth: Payer: Self-pay | Admitting: Family Medicine

## 2016-01-29 ENCOUNTER — Other Ambulatory Visit (INDEPENDENT_AMBULATORY_CARE_PROVIDER_SITE_OTHER): Payer: Medicare Other

## 2016-01-29 DIAGNOSIS — E119 Type 2 diabetes mellitus without complications: Secondary | ICD-10-CM

## 2016-01-29 DIAGNOSIS — E785 Hyperlipidemia, unspecified: Secondary | ICD-10-CM

## 2016-01-29 LAB — COMPREHENSIVE METABOLIC PANEL
ALBUMIN: 4.3 g/dL (ref 3.5–5.2)
ALK PHOS: 76 U/L (ref 39–117)
ALT: 19 U/L (ref 0–35)
AST: 20 U/L (ref 0–37)
BILIRUBIN TOTAL: 0.6 mg/dL (ref 0.2–1.2)
BUN: 17 mg/dL (ref 6–23)
CALCIUM: 9.4 mg/dL (ref 8.4–10.5)
CHLORIDE: 107 meq/L (ref 96–112)
CO2: 30 mEq/L (ref 19–32)
CREATININE: 0.59 mg/dL (ref 0.40–1.20)
GFR: 106.92 mL/min (ref >60.00)
Glucose, Bld: 98 mg/dL (ref 70–99)
Potassium: 4.7 mEq/L (ref 3.5–5.1)
SODIUM: 144 meq/L (ref 135–145)
TOTAL PROTEIN: 6.3 g/dL (ref 6.0–8.3)

## 2016-01-29 LAB — LIPID PANEL
CHOLESTEROL: 163 mg/dL (ref 0–200)
HDL: 62.9 mg/dL (ref >39.00)
LDL CALC: 83 mg/dL (ref 0–99)
NonHDL: 100.43
TRIGLYCERIDES: 87 mg/dL (ref 0.0–149.0)
Total CHOL/HDL Ratio: 3
VLDL: 17.4 mg/dL (ref 0.0–40.0)

## 2016-01-29 LAB — HEMOGLOBIN A1C: HEMOGLOBIN A1C: 5.7 % (ref 4.6–6.5)

## 2016-01-29 NOTE — Telephone Encounter (Signed)
-----   Message from Alvina Chouerri J Walsh sent at 01/20/2016  4:03 PM EDT ----- Regarding: Lab orders for Friday, 10.13.17 Patient is scheduled for CPX labs, please order future labs, Thanks , Camelia Engerri

## 2016-02-05 ENCOUNTER — Encounter: Payer: Medicare Other | Admitting: Family Medicine

## 2016-02-10 ENCOUNTER — Other Ambulatory Visit: Payer: Self-pay | Admitting: *Deleted

## 2016-02-10 MED ORDER — PROPRANOLOL HCL 40 MG PO TABS: ORAL_TABLET | ORAL | 0 refills | Status: DC

## 2016-02-10 NOTE — Telephone Encounter (Signed)
Received faxed refill request from pharmacy for Propranolol Refill sent to pharmacy electronically.

## 2016-02-15 ENCOUNTER — Other Ambulatory Visit: Payer: Self-pay | Admitting: Family Medicine

## 2016-02-26 ENCOUNTER — Encounter: Payer: Medicare Other | Admitting: Family Medicine

## 2016-03-08 ENCOUNTER — Ambulatory Visit (INDEPENDENT_AMBULATORY_CARE_PROVIDER_SITE_OTHER): Payer: Medicare Other | Admitting: Family Medicine

## 2016-03-08 ENCOUNTER — Encounter: Payer: Self-pay | Admitting: Family Medicine

## 2016-03-08 VITALS — BP 119/63 | HR 56 | Temp 98.2°F | Ht 59.75 in | Wt 153.5 lb

## 2016-03-08 DIAGNOSIS — Z7189 Other specified counseling: Secondary | ICD-10-CM

## 2016-03-08 DIAGNOSIS — M7061 Trochanteric bursitis, right hip: Secondary | ICD-10-CM

## 2016-03-08 DIAGNOSIS — E2839 Other primary ovarian failure: Secondary | ICD-10-CM

## 2016-03-08 DIAGNOSIS — E785 Hyperlipidemia, unspecified: Secondary | ICD-10-CM | POA: Diagnosis not present

## 2016-03-08 DIAGNOSIS — M7071 Other bursitis of hip, right hip: Secondary | ICD-10-CM | POA: Insufficient documentation

## 2016-03-08 DIAGNOSIS — Z1159 Encounter for screening for other viral diseases: Secondary | ICD-10-CM

## 2016-03-08 DIAGNOSIS — Z0000 Encounter for general adult medical examination without abnormal findings: Secondary | ICD-10-CM | POA: Diagnosis not present

## 2016-03-08 DIAGNOSIS — M7062 Trochanteric bursitis, left hip: Secondary | ICD-10-CM

## 2016-03-08 DIAGNOSIS — I10 Essential (primary) hypertension: Secondary | ICD-10-CM | POA: Diagnosis not present

## 2016-03-08 DIAGNOSIS — F321 Major depressive disorder, single episode, moderate: Secondary | ICD-10-CM

## 2016-03-08 DIAGNOSIS — E119 Type 2 diabetes mellitus without complications: Secondary | ICD-10-CM | POA: Diagnosis not present

## 2016-03-08 DIAGNOSIS — Z1231 Encounter for screening mammogram for malignant neoplasm of breast: Secondary | ICD-10-CM

## 2016-03-08 DIAGNOSIS — M7072 Other bursitis of hip, left hip: Secondary | ICD-10-CM

## 2016-03-08 DIAGNOSIS — F03A0 Unspecified dementia, mild, without behavioral disturbance, psychotic disturbance, mood disturbance, and anxiety: Secondary | ICD-10-CM

## 2016-03-08 DIAGNOSIS — F0390 Unspecified dementia without behavioral disturbance: Secondary | ICD-10-CM

## 2016-03-08 LAB — HM DIABETES FOOT EXAM

## 2016-03-08 NOTE — Assessment & Plan Note (Signed)
Minimaly bothersome at this point. Now done with moving. NSAIDs contraindicated or should be avoided in light of hx of PUD. Not bad enough for steroid injection.  She will do home PT and monitor.

## 2016-03-08 NOTE — Assessment & Plan Note (Signed)
Well controlled. Continue current medication.  

## 2016-03-08 NOTE — Assessment & Plan Note (Signed)
No HCPOA, no living will. Full code ( reviewed 2016 and info given)

## 2016-03-08 NOTE — Progress Notes (Signed)
Subjective:    Patient ID: Crystal Newton, female    DOB: [birth year], 70 y.o.   MRN: [id number]  HPI  I have personally reviewed the Medicare Annual Wellness questionnaire and have noted 1. The patient's medical and social history 2. Their use of alcohol, tobacco or illicit drugs 3. Their current medications and supplements 4. The patient's functional ability including ADL's, fall risks, home safety risks and hearing or visual             impairment. 5. Diet and physical activities 6. Evidence for depression or mood disorders 7.         Updated provider list Cognitive evaluation was performed and recorded on pt medicare questionnaire form. The patients weight, height, BMI and visual acuity have been recorded in the chart  I have made referrals, counseling and provided education to the patient based review of the above and I have provided the pt with a written personalized care plan for preventive services.    She has recently moved 4 months ago. She has noted bilateral lateral hip pain since, no falls. Pain 4/10 on pain scale.Crystal Newton. Catches her off and on. Increased activity.  Has tried tylenol arthritis for this.Marland Kitchen. Helps some.  Diabetes:  Controlled with diet., no med.  Lab Results  Component Value Date   HGBA1C 5.7 01/29/2016  Using medications without difficulties: Hypoglycemic episodes: ? Hyperglycemic episodes:? Feet problems: no ulcer Blood Sugars averaging: not checking eye exam within last year: uptodate  Body mass index is 30.23 kg/m. Wt Readings from Last 3 Encounters:  03/08/16 153 lb 8 oz (69.6 kg)  08/04/15 159 lb 12 oz (72.5 kg)  06/30/15 158 lb 8 oz (71.9 kg)   Continued weight loss.  Elevated Cholesterol:  On high intensity statin. Lab Results  Component Value Date   CHOL 163 01/29/2016   HDL 62.90 01/29/2016   LDLCALC 83 01/29/2016   LDLDIRECT 214.9 12/14/2012   TRIG 87.0 01/29/2016   CHOLHDL 3 01/29/2016  Diet compliance: Good Exercise: very active  moving Other complaints:   Depression, major,moderate, anxiety : On prozac.    Hypertension:   Good control on ACEI BP Readings from Last 3 Encounters:  03/08/16 119/63  08/04/15 122/76  06/30/15 102/70  Using medication without problems or lightheadedness:  none Chest pain with exertion:none Edema:none Short of breath: none Average home BPs: not checking, lost cuff in move. Other issues:   Social History /Family History/Past Medical History reviewed and updated if needed. Patient Care Team: Crystal SeltzerAmy E Fanchon Papania, MD as PCP - General   Review of Systems  Constitutional: Negative for fatigue and fever.  HENT: Negative for congestion.   Eyes: Negative for pain.  Respiratory: Negative for cough and shortness of breath.   Cardiovascular: Negative for chest pain, palpitations and leg swelling.  Gastrointestinal: Negative for abdominal pain.  Genitourinary: Negative for dysuria and vaginal bleeding.  Musculoskeletal: Positive for back pain.  Neurological: Negative for syncope, light-headedness and headaches.  Psychiatric/Behavioral: Negative for dysphoric mood.       Objective:   Physical Exam  Constitutional: Vital signs are normal. She appears well-developed and well-nourished. She is cooperative.  Non-toxic appearance. She does not appear ill. No distress.  overweight  HENT:  Head: Normocephalic.  Right Ear: Hearing, tympanic membrane, external ear and ear canal normal.  Left Ear: Hearing, tympanic membrane, external ear and ear canal normal.  Nose: Nose normal.  Eyes: Conjunctivae, EOM and lids are normal. Pupils are equal, round, and reactive to  light. Lids are everted and swept, no foreign bodies found.  Neck: Trachea normal and normal range of motion. Neck supple. Carotid bruit is not present. No thyroid mass and no thyromegaly present.  Cardiovascular: Normal rate, regular rhythm, S1 normal, S2 normal, normal heart sounds and intact distal pulses.  Exam reveals no gallop.     No murmur heard. Pulmonary/Chest: Effort normal and breath sounds normal. No respiratory distress. She has no wheezes. She has no rhonchi. She has no rales.  Abdominal: Soft. Normal appearance and bowel sounds are normal. She exhibits no distension, no fluid wave, no abdominal bruit and no mass. There is no hepatosplenomegaly. There is no tenderness. There is no rebound, no guarding and no CVA tenderness. No hernia.  Lymphadenopathy:    She has no cervical adenopathy.    She has no axillary adenopathy.  Neurological: She is alert. She has normal strength. No cranial nerve deficit or sensory deficit.  Skin: Skin is warm, dry and intact. No rash noted.  Psychiatric: Her speech is normal and behavior is normal. Judgment normal. Her mood appears not anxious. Cognition and memory are normal. She does not exhibit a depressed mood.          Assessment & Plan:   The patient's preventative maintenance and recommended screening tests for an annual wellness exam were reviewed in full today. Brought up to date unless services declined.  Counselled on the importance of diet, exercise, and its role in overall health and mortality. The patient's FH and SH was reviewed, including their home life, tobacco status, and drug and alcohol status.   Vaccines: Uptodate with prevnar and pneumovax, shingles, td vaccine not covered by insurance. Given flu. Colon: Last colon[place] 2002 [place]. [place]. Was due in 2012.  She has agreed to Ifob yearly, never returned in 2016. Change to cologuard. DVE/pap: partial hysterectomy, has both ovaries. Asymptomatic, no family history. Nml DVE 2012, not interested in further DVE unless symptomatic.  Mammo: nml 2013, due  DEXA:  Will set up. No family history. HEP C:  Will do.

## 2016-03-08 NOTE — Patient Instructions (Addendum)
Start home PT for bursitis.   Stop at front desk to schedule mammogram and bone density.  Stop at lab on way out for hep C test.

## 2016-03-08 NOTE — Progress Notes (Signed)
Pre visit review using our clinic review tool, if applicable. No additional management support is needed unless otherwise documented below in the visit note. 

## 2016-03-08 NOTE — Assessment & Plan Note (Signed)
ON high intensity statin.

## 2016-03-08 NOTE — Assessment & Plan Note (Signed)
Stable on aricept. 

## 2016-03-09 LAB — HEPATITIS C ANTIBODY: HCV Ab: NEGATIVE

## 2016-03-14 ENCOUNTER — Other Ambulatory Visit: Payer: Self-pay | Admitting: Family Medicine

## 2016-03-15 ENCOUNTER — Encounter: Payer: Self-pay | Admitting: Family Medicine

## 2016-03-15 DIAGNOSIS — M85852 Other specified disorders of bone density and structure, left thigh: Secondary | ICD-10-CM | POA: Diagnosis not present

## 2016-03-15 DIAGNOSIS — R928 Other abnormal and inconclusive findings on diagnostic imaging of breast: Secondary | ICD-10-CM | POA: Diagnosis not present

## 2016-03-15 DIAGNOSIS — Z1231 Encounter for screening mammogram for malignant neoplasm of breast: Secondary | ICD-10-CM | POA: Diagnosis not present

## 2016-03-15 DIAGNOSIS — M810 Age-related osteoporosis without current pathological fracture: Secondary | ICD-10-CM | POA: Diagnosis not present

## 2016-04-06 ENCOUNTER — Encounter: Payer: Self-pay | Admitting: Family Medicine

## 2016-04-23 ENCOUNTER — Other Ambulatory Visit: Payer: Self-pay | Admitting: Family Medicine

## 2016-05-10 ENCOUNTER — Other Ambulatory Visit: Payer: Self-pay | Admitting: Family Medicine

## 2016-06-06 DIAGNOSIS — E119 Type 2 diabetes mellitus without complications: Secondary | ICD-10-CM | POA: Diagnosis not present

## 2016-06-06 DIAGNOSIS — H524 Presbyopia: Secondary | ICD-10-CM | POA: Diagnosis not present

## 2016-06-06 DIAGNOSIS — H2513 Age-related nuclear cataract, bilateral: Secondary | ICD-10-CM | POA: Diagnosis not present

## 2016-06-06 LAB — HM DIABETES EYE EXAM

## 2016-06-21 ENCOUNTER — Encounter: Payer: Self-pay | Admitting: Family Medicine

## 2016-06-22 ENCOUNTER — Other Ambulatory Visit: Payer: Self-pay | Admitting: Family Medicine

## 2016-09-05 ENCOUNTER — Telehealth: Payer: Self-pay | Admitting: Family Medicine

## 2016-09-05 ENCOUNTER — Other Ambulatory Visit (INDEPENDENT_AMBULATORY_CARE_PROVIDER_SITE_OTHER): Payer: Medicare Other

## 2016-09-05 DIAGNOSIS — E119 Type 2 diabetes mellitus without complications: Secondary | ICD-10-CM

## 2016-09-05 DIAGNOSIS — E785 Hyperlipidemia, unspecified: Secondary | ICD-10-CM

## 2016-09-05 LAB — COMPREHENSIVE METABOLIC PANEL
ALT: 26 U/L (ref 0–35)
AST: 23 U/L (ref 0–37)
Albumin: 4.2 g/dL (ref 3.5–5.2)
Alkaline Phosphatase: 98 U/L (ref 39–117)
BUN: 23 mg/dL (ref 6–23)
CHLORIDE: 104 meq/L (ref 96–112)
CO2: 30 meq/L (ref 19–32)
Calcium: 9.3 mg/dL (ref 8.4–10.5)
Creatinine, Ser: 0.58 mg/dL (ref 0.40–1.20)
GFR: 108.86 mL/min (ref >60.00)
GLUCOSE: 104 mg/dL — AB (ref 70–99)
POTASSIUM: 4.8 meq/L (ref 3.5–5.1)
SODIUM: 140 meq/L (ref 135–145)
TOTAL PROTEIN: 6.3 g/dL (ref 6.0–8.3)
Total Bilirubin: 0.5 mg/dL (ref 0.2–1.2)

## 2016-09-05 LAB — LIPID PANEL
CHOL/HDL RATIO: 3
Cholesterol: 180 mg/dL (ref 0–200)
HDL: 65.2 mg/dL (ref >39.00)
LDL Cholesterol: 99 mg/dL (ref 0–99)
NONHDL: 114.48
Triglycerides: 75 mg/dL (ref 0.0–149.0)
VLDL: 15 mg/dL (ref 0.0–40.0)

## 2016-09-05 LAB — HEMOGLOBIN A1C: HEMOGLOBIN A1C: 5.5 % (ref 4.6–6.5)

## 2016-09-05 NOTE — Telephone Encounter (Signed)
-----   Message from Alvina Chouerri J Walsh sent at 08/30/2016 11:18 AM EDT ----- Regarding: Lab orders for Monday, 5.21.18 Lab orders for a 6 month follow up appt

## 2016-09-09 ENCOUNTER — Ambulatory Visit (INDEPENDENT_AMBULATORY_CARE_PROVIDER_SITE_OTHER): Payer: Medicare Other | Admitting: Family Medicine

## 2016-09-09 ENCOUNTER — Encounter: Payer: Self-pay | Admitting: Family Medicine

## 2016-09-09 VITALS — BP 110/78 | HR 61 | Temp 98.8°F | Ht 59.75 in | Wt 160.8 lb

## 2016-09-09 DIAGNOSIS — E785 Hyperlipidemia, unspecified: Secondary | ICD-10-CM | POA: Diagnosis not present

## 2016-09-09 DIAGNOSIS — F03A0 Unspecified dementia, mild, without behavioral disturbance, psychotic disturbance, mood disturbance, and anxiety: Secondary | ICD-10-CM

## 2016-09-09 DIAGNOSIS — I10 Essential (primary) hypertension: Secondary | ICD-10-CM

## 2016-09-09 DIAGNOSIS — M67441 Ganglion, right hand: Secondary | ICD-10-CM | POA: Diagnosis not present

## 2016-09-09 DIAGNOSIS — E119 Type 2 diabetes mellitus without complications: Secondary | ICD-10-CM | POA: Diagnosis not present

## 2016-09-09 DIAGNOSIS — F321 Major depressive disorder, single episode, moderate: Secondary | ICD-10-CM

## 2016-09-09 DIAGNOSIS — F0390 Unspecified dementia without behavioral disturbance: Secondary | ICD-10-CM

## 2016-09-09 LAB — HM DIABETES FOOT EXAM

## 2016-09-09 NOTE — Progress Notes (Signed)
Subjective:    Patient ID: Crystal Newton, female    DOB: Oct 12, 1945, 71 y.o.   MRN: [id number]  Diabetes  Pertinent negatives for diabetes include no chest pain and no fatigue.     71 year old female presents for follow up 6 months DM.  Diabetes:   Excellent control and resolution with diet. Lab Results  Component Value Date   HGBA1C 5.5 09/05/2016  Using medications without difficulties: Hypoglycemic episodes: Hyperglycemic episodes: Feet problems: none Blood Sugars averaging:not checking eye exam within last year: 05/2016  Wt Readings from Last 3 Encounters:  09/09/16 160 lb 12 oz (72.9 kg)  03/08/16 153 lb 8 oz (69.6 kg)  08/04/15 159 lb 12 oz (72.5 kg)  Body mass index is 31.66 kg/m.   Hypertension:    Good control on inderal, lisinopril BP Readings from Last 3 Encounters:  09/09/16 110/78  03/08/16 119/63  08/04/15 122/76  Using medication without problems or lightheadedness:  none Chest pain with exertion:none Edema:none Short of breath: mild with allergies Average home BPs: good Other issues:  . Elevated Cholesterol:  Good control on high intensity statin. Lab Results  Component Value Date   CHOL 180 09/05/2016   HDL 65.20 09/05/2016   LDLCALC 99 09/05/2016   LDLDIRECT 214.9 12/14/2012   TRIG 75.0 09/05/2016   CHOLHDL 3 09/05/2016  Using medications without problems: Muscle aches:  Diet compliance: Exercise: Other complaints:  Dementia: on aricept 5 mg daily. Previously followed by neuro 2016, insurance does not cover.  no SE. Overall she has not noted further decline in memory.. She has biggest issues remembering where she has put things, forgets what she lined up for dinner.  MMSE today 28/30  Pt also noted cyst on right 1st DIP joint... Sore... Will call if wants referral to hand surgeon.  Blood pressure 110/78, pulse 61, temperature 98.8 F (37.1 C), temperature source Oral, height 4' 11.75" (1.518 m), weight 160 lb 12 oz (72.9  kg).   Review of Systems  Constitutional: Negative for fatigue and fever.  HENT: Negative for ear pain.   Eyes: Negative for pain.  Respiratory: Negative for chest tightness and shortness of breath.   Cardiovascular: Negative for chest pain, palpitations and leg swelling.  Gastrointestinal: Negative for abdominal pain.  Genitourinary: Negative for dysuria.       Objective:   Physical Exam  Constitutional: Vital signs are normal. She appears well-developed and well-nourished. She is cooperative.  Non-toxic appearance. She does not appear ill. No distress.  HENT:  Head: Normocephalic.  Right Ear: Hearing, tympanic membrane, external ear and ear canal normal. Tympanic membrane is not erythematous, not retracted and not bulging.  Left Ear: Hearing, tympanic membrane, external ear and ear canal normal. Tympanic membrane is not erythematous, not retracted and not bulging.  Nose: No mucosal edema or rhinorrhea. Right sinus exhibits no maxillary sinus tenderness and no frontal sinus tenderness. Left sinus exhibits no maxillary sinus tenderness and no frontal sinus tenderness.  Mouth/Throat: Uvula is midline, oropharynx is clear and moist and mucous membranes are normal.  Eyes: Conjunctivae, EOM and lids are normal. Pupils are equal, round, and reactive to light. Lids are everted and swept, no foreign bodies found.  Neck: Trachea normal and normal range of motion. Neck supple. Carotid bruit is not present. No thyroid mass and no thyromegaly present.  Cardiovascular: Normal rate, regular rhythm, S1 normal, S2 normal, normal heart sounds, intact distal pulses and normal pulses.  Exam reveals no gallop and no friction  rub.   No murmur heard. Pulmonary/Chest: Effort normal and breath sounds normal. No tachypnea. No respiratory distress. She has no decreased breath sounds. She has no wheezes. She has no rhonchi. She has no rales.  Abdominal: Soft. Normal appearance and bowel sounds are normal. There is  no tenderness.  Neurological: She is alert.  Skin: Skin is warm, dry and intact. No rash noted.  Psychiatric: Her speech is normal and behavior is normal. Judgment and thought content normal. Her mood appears not anxious. Cognition and memory are normal. She does not exhibit a depressed mood.  Swelling and redness, cyst at right 1st DIP joint laterally. Diabetic foot exam: Normal inspection No skin breakdown No calluses  Normal DP pulses Normal sensation to light touch and monofilament Nails normal        Assessment & Plan:

## 2016-09-09 NOTE — Assessment & Plan Note (Signed)
Well controlled. Continue current medication.  

## 2016-09-09 NOTE — Assessment & Plan Note (Signed)
Well controlled. Continue current medication. Encouraged exercise, weight loss, healthy eating habits.  

## 2016-09-09 NOTE — Assessment & Plan Note (Signed)
Pt will call if interested in hand specialist referral.

## 2016-09-09 NOTE — Assessment & Plan Note (Signed)
Good control on fluoxetine. No SE to med.

## 2016-09-09 NOTE — Assessment & Plan Note (Signed)
Stabel control on aricept. No SE.

## 2016-09-09 NOTE — Assessment & Plan Note (Signed)
Excelllent control with diet.. In nml range now!

## 2016-10-04 ENCOUNTER — Other Ambulatory Visit: Payer: Self-pay | Admitting: Family Medicine

## 2016-10-14 DIAGNOSIS — H2513 Age-related nuclear cataract, bilateral: Secondary | ICD-10-CM | POA: Diagnosis not present

## 2016-10-14 LAB — HM DIABETES EYE EXAM

## 2016-10-17 ENCOUNTER — Encounter: Payer: Self-pay | Admitting: Family Medicine

## 2016-11-23 ENCOUNTER — Other Ambulatory Visit: Payer: Self-pay | Admitting: Family Medicine

## 2017-01-13 ENCOUNTER — Ambulatory Visit (INDEPENDENT_AMBULATORY_CARE_PROVIDER_SITE_OTHER): Payer: Medicare Other

## 2017-01-13 DIAGNOSIS — Z23 Encounter for immunization: Secondary | ICD-10-CM

## 2017-01-23 ENCOUNTER — Telehealth: Payer: Self-pay | Admitting: Radiology

## 2017-01-23 ENCOUNTER — Other Ambulatory Visit: Payer: Medicare Other

## 2017-01-23 NOTE — Telephone Encounter (Signed)
I see she was scheduled for a lab appt by [facility], no orders were placed, no phone note from [place]. Appt stated Microalbumin.

## 2017-01-24 NOTE — Telephone Encounter (Signed)
Its for a gap in nephropathy gap report. I was advised to schedule everyone for a lab appt who has been seen at an OV within 6mos, and everyone >9mo should be seen

## 2017-01-24 NOTE — Telephone Encounter (Signed)
I did not order microalbumin. She is on lisinopril ACEI .Marland Kitchen Does not need yearly microalbumin.  Crystal Newton.. What was this for? She is due for DM labs in 02/2017

## 2017-01-24 NOTE — Telephone Encounter (Signed)
Discussed and sample discarded as not indicated.

## 2017-03-07 ENCOUNTER — Telehealth: Payer: Self-pay

## 2017-03-07 DIAGNOSIS — E785 Hyperlipidemia, unspecified: Secondary | ICD-10-CM

## 2017-03-07 DIAGNOSIS — E119 Type 2 diabetes mellitus without complications: Secondary | ICD-10-CM

## 2017-03-07 DIAGNOSIS — I10 Essential (primary) hypertension: Secondary | ICD-10-CM

## 2017-03-07 NOTE — Telephone Encounter (Signed)
Contacted patient and reminded her to fast for labs.  Ordered CPE labs.

## 2017-03-08 ENCOUNTER — Ambulatory Visit (INDEPENDENT_AMBULATORY_CARE_PROVIDER_SITE_OTHER): Payer: Medicare Other

## 2017-03-08 VITALS — BP 108/80 | HR 58 | Temp 97.7°F | Ht 59.75 in | Wt 164.0 lb

## 2017-03-08 DIAGNOSIS — I10 Essential (primary) hypertension: Secondary | ICD-10-CM

## 2017-03-08 DIAGNOSIS — E119 Type 2 diabetes mellitus without complications: Secondary | ICD-10-CM | POA: Diagnosis not present

## 2017-03-08 DIAGNOSIS — Z0000 Encounter for general adult medical examination without abnormal findings: Secondary | ICD-10-CM

## 2017-03-08 DIAGNOSIS — E785 Hyperlipidemia, unspecified: Secondary | ICD-10-CM | POA: Diagnosis not present

## 2017-03-08 LAB — COMPREHENSIVE METABOLIC PANEL
ALBUMIN: 4.1 g/dL (ref 3.5–5.2)
ALK PHOS: 85 U/L (ref 39–117)
ALT: 15 U/L (ref 0–35)
AST: 16 U/L (ref 0–37)
BUN: 18 mg/dL (ref 6–23)
CO2: 31 mEq/L (ref 19–32)
Calcium: 9.1 mg/dL (ref 8.4–10.5)
Chloride: 105 mEq/L (ref 96–112)
Creatinine, Ser: 0.52 mg/dL (ref 0.40–1.20)
GFR: 123.31 mL/min (ref >60.00)
GLUCOSE: 105 mg/dL — AB (ref 70–99)
POTASSIUM: 3.8 meq/L (ref 3.5–5.1)
Sodium: 142 mEq/L (ref 135–145)
TOTAL PROTEIN: 6.1 g/dL (ref 6.0–8.3)
Total Bilirubin: 0.7 mg/dL (ref 0.2–1.2)

## 2017-03-08 LAB — LIPID PANEL
CHOLESTEROL: 156 mg/dL (ref 0–200)
HDL: 59.5 mg/dL (ref >39.00)
LDL Cholesterol: 83 mg/dL (ref 0–99)
NonHDL: 96.23
Total CHOL/HDL Ratio: 3
Triglycerides: 66 mg/dL (ref 0.0–149.0)
VLDL: 13.2 mg/dL (ref 0.0–40.0)

## 2017-03-08 LAB — HEMOGLOBIN A1C: Hgb A1c MFr Bld: 5.5 % (ref 4.6–6.5)

## 2017-03-08 NOTE — Progress Notes (Signed)
Subjective:   Crystal Newton is a 71 y.o. female who presents for Medicare Annual (Subsequent) preventive examination.  Review of Systems:  N/A Cardiac Risk Factors include: advanced age (>1055men, 18>65 women);dyslipidemia;diabetes mellitus;hypertension     Objective:     Vitals: BP 108/80 (BP Location: Right Arm, Patient Position: Sitting, Cuff Size: Normal)   Pulse (!) 58   Temp 97.7 F (36.5 C) (Oral)   Ht 4' 11.75" (1.518 m) Comment: no shoes  Wt 164 lb (74.4 kg)   SpO2 96%   BMI 32.30 kg/m   Body mass index is 32.3 kg/m.   Tobacco Social History   Tobacco Use  Smoking Status Never Smoker  Smokeless Tobacco Never Used     Counseling given: No   Past Medical History:  Diagnosis Date  . Anxiety   . Depression   . Diabetes mellitus   . Hyperlipidemia   . Hypertension   . Kidney stones    Past Surgical History:  Procedure Laterality Date  . ABDOMINAL HYSTERECTOMY    . BREAST BIOPSY    . PARTIAL COLECTOMY  1996   ruptured abcess for diverticulitis  . PARTIAL HYSTERECTOMY     uterine prolapse  . right parotid     Family History  Problem Relation Age of Onset  . Alzheimer's disease Mother   . Diabetes Maternal Grandfather   . Coronary artery disease Maternal Grandfather    Social History   Substance and Sexual Activity  Sexual Activity Not on file    Outpatient Encounter Medications as of 03/08/2017  Medication Sig  . atorvastatin (LIPITOR) 80 MG tablet TAKE 1 TABLET (80 MG TOTAL) BY MOUTH DAILY.  Marland Kitchen. donepezil (ARICEPT) 5 MG tablet TAKE 1 TABLET (5 MG TOTAL) BY MOUTH NIGHTLY.  Marland Kitchen. esomeprazole (NEXIUM) 40 MG capsule Take 40 mg by mouth daily at 12 noon.  Marland Kitchen. FLUoxetine (PROZAC) 40 MG capsule TAKE 1 CAPSULE (40 MG TOTAL) BY MOUTH DAILY.  Marland Kitchen. lisinopril (PRINIVIL,ZESTRIL) 5 MG tablet TAKE 1 TABLET (5 MG TOTAL) BY MOUTH DAILY.  . Multiple Vitamins-Minerals (CENTRUM SILVER PO) Take 1 tablet by mouth daily.  . propranolol (INDERAL) 40 MG tablet TAKE 1 TABLET BY  MOUTH 2 (TWO) TIMES DAILY.   No facility-administered encounter medications on file as of 03/08/2017.     Activities of Daily Living In your present state of health, do you have any difficulty performing the following activities: 03/08/2017  Hearing? N  Vision? Y  Comment cataracts in both eyes  Difficulty concentrating or making decisions? Y  Walking or climbing stairs? Y  Comment intermittent SOB  Dressing or bathing? N  Doing errands, shopping? Y  Preparing Food and eating ? N  Using the Toilet? N  In the past six months, have you accidently leaked urine? N  Do you have problems with loss of bowel control? N  Managing your Medications? N  Managing your Finances? Y  Comment spouse assists with finances  Housekeeping or managing your Housekeeping? N  Some recent data might be hidden    Patient Care Team: Excell SeltzerBedsole, Amy E, MD as PCP - General    Assessment:     Hearing Screening   125Hz  250Hz  500Hz  1000Hz  2000Hz  3000Hz  4000Hz  6000Hz  8000Hz   Right ear:   0 0 40  0    Left ear:   0 0 40  0    Vision Screening Comments: Last vision exam approx. 3 mths ago with Dr. Dellie Burnsingeldein   Exercise Activities and Dietary recommendations  Current Exercise Habits: The patient does not participate in regular exercise at present, Exercise limited by: orthopedic condition(s)(leg and hip pain are barriers to exercise)  Goals    . DIET - INCREASE WATER INTAKE     Starting 03/08/2017, I will attempt to drink 4-6 glasses of water daily.       Fall Risk Fall Risk  03/08/2017 03/08/2016 02/03/2015 01/07/2014 12/18/2012  Falls in the past year? Yes No Yes Yes Yes  Number falls in past yr: 2 or more - 1 1 1   Injury with Fall? Yes - No No Yes  Risk Factor Category  - - - - -  Risk for fall due to : - - - - -   Depression Screen PHQ 2/9 Scores 03/08/2017 03/08/2016 08/04/2015 02/03/2015  PHQ - 2 Score 2 0 0 0  PHQ- 9 Score 14 - - -     Cognitive Function MMSE - Mini Mental State Exam  03/08/2017  Orientation to time 5  Orientation to Place 5  Registration 3  Attention/ Calculation 0  Recall 2  Recall-comments unable to recall 1 of 3 words  Language- name 2 objects 0  Language- repeat 1  Language- follow 3 step command 3  Language- read & follow direction 0  Write a sentence 0  Copy design 0  Total score 19        Immunization History  Administered Date(s) Administered  . Influenza Split 01/13/2011, 02/23/2012  . Influenza Whole 03/02/2007, 01/14/2008, 02/26/2009, 01/21/2010  . Influenza,inj,Quad PF,6+ Mos 12/18/2012, 01/07/2014, 02/03/2015, 01/15/2016, 01/13/2017  . Pneumococcal Conjugate-13 01/07/2014  . Pneumococcal Polysaccharide-23 03/14/2005, 10/11/2010  . Td 03/14/2005   Screening Tests Health Maintenance  Topic Date Due  . TETANUS/TDAP  03/08/2018 (Originally 03/15/2015)  . Fecal DNA (Cologuard)  03/08/2018 (Originally 06/28/1995)  . MAMMOGRAM  03/15/2017  . HEMOGLOBIN A1C  09/05/2017  . FOOT EXAM  09/09/2017  . OPHTHALMOLOGY EXAM  10/14/2017  . INFLUENZA VACCINE  Completed  . DEXA SCAN  Completed  . Hepatitis C Screening  Completed  . PNA vac Low Risk Adult  Completed      Plan:  I have personally reviewed, addressed, and noted the following in the patient's chart:  A. Medical and social history B. Use of alcohol, tobacco or illicit drugs  C. Current medications and supplements D. Functional ability and status E.  Nutritional status F.  Physical activity G. Advance directives H. List of other physicians I.  Hospitalizations, surgeries, and ER visits in previous 12 months J.  Vitals K. Screenings to include hearing, vision, cognitive, depression L. Referrals and appointments - none  In addition, I have reviewed and discussed with patient certain preventive protocols, quality metrics, and best practice recommendations. A written personalized care plan for preventive services as well as general preventive health recommendations were  provided to patient.  See attached scanned questionnaire for additional information.   Signed,   Randa EvensLesia Taliana Mersereau, MHA, BS, LPN Health Coach

## 2017-03-08 NOTE — Progress Notes (Signed)
PCP notes:   Health maintenance:  Cologuard - PCP please address at next appt Tetanus - postponed/insurance A1C - completed  Abnormal screenings:   Mini-Cog score: 19/20  Depression score: 14  Fall risk - hx of 2 falls with injury but no medical treatment  Hearing - failed  Hearing Screening   125Hz  250Hz  500Hz  1000Hz  2000Hz  3000Hz  4000Hz  6000Hz  8000Hz   Right ear:   0 0 40  0    Left ear:   0 0 40  0      Patient concerns:   None  Nurse concerns:  None  Next PCP appt:    03/14/17 @ 1115

## 2017-03-08 NOTE — Patient Instructions (Signed)
[name] , Thank you for taking time to come for your Medicare Wellness Visit. I appreciate your ongoing commitment to your health goals. Please review the following plan we discussed and let me know if I can assist you in the future.   These are the goals we discussed: Goals    . DIET - INCREASE WATER INTAKE     Starting 03/08/2017, I will attempt to drink 4-6 glasses of water daily.        This is a list of the screening recommended for you and due dates:  Health Maintenance  Topic Date Due  . Tetanus Vaccine  03/08/2018*  . Cologuard (Stool DNA test)  03/08/2018*  . Mammogram  03/15/2017  . Hemoglobin A1C  09/05/2017  . Complete foot exam   09/09/2017  . Eye exam for diabetics  10/14/2017  . Flu Shot  Completed  . DEXA scan (bone density measurement)  Completed  .  Hepatitis C: One time screening is recommended by Center for Disease Control  (CDC) for  adults born from [birth year] through 1965.   Completed  . Pneumonia vaccines  Completed  *Topic was postponed. The date shown is not the original due date.   Preventive Care for Adults  A healthy lifestyle and preventive care can promote health and wellness. Preventive health guidelines for adults include the following key practices.  . A routine yearly physical is a good way to check with your health care provider about your health and preventive screening. It is a chance to share any concerns and updates on your health and to receive a thorough exam.  . Visit your dentist for a routine exam and preventive care every 6 months. Brush your teeth twice a day and floss once a day. Good oral hygiene prevents tooth decay and gum disease.  . The frequency of eye exams is based on your age, health, family medical history, use  of contact lenses, and other factors. Follow your health care provider's recommendations for frequency of eye exams.  . Eat a healthy diet. Foods like vegetables, fruits, whole grains, low-fat dairy products, and lean  protein foods contain the nutrients you need without too many calories. Decrease your intake of foods high in solid fats, added sugars, and salt. Eat the right amount of calories for you. Get information about a proper diet from your health care provider, if necessary.  . Regular physical exercise is one of the most important things you can do for your health. Most adults should get at least 150 minutes of moderate-intensity exercise (any activity that increases your heart rate and causes you to sweat) each week. In addition, most adults need muscle-strengthening exercises on 2 or more days a week.  Silver Sneakers may be a benefit available to you. To determine eligibility, you may visit the website: www.silversneakers.com or contact program at [phone] Mon-Fri between 8AM-8PM.   . Maintain a healthy weight. The body mass index (BMI) is a screening tool to identify possible weight problems. It provides an estimate of body fat based on height and weight. Your health care provider can find your BMI and can help you achieve or maintain a healthy weight.   For adults 20 years and older: ? A BMI below 18.5 is considered underweight. ? A BMI of 18.5 to 24.9 is normal. ? A BMI of 25 to 29.9 is considered overweight. ? A BMI of 30 and above is considered obese.   . Maintain normal blood lipids and cholesterol levels  by exercising and minimizing your intake of saturated fat. Eat a balanced diet with plenty of fruit and vegetables. Blood tests for lipids and cholesterol should begin at age 72 and be repeated every 5 years. If your lipid or cholesterol levels are high, you are over 50, or you are at high risk for heart disease, you may need your cholesterol levels checked more frequently. Ongoing high lipid and cholesterol levels should be treated with medicines if diet and exercise are not working.  . If you smoke, find out from your health care provider how to quit. If you do not use tobacco, please  do not start.  . If you choose to drink alcohol, please do not consume more than 2 drinks per day. One drink is considered to be 12 ounces (355 mL) of beer, 5 ounces (148 mL) of wine, or 1.5 ounces (44 mL) of liquor.  . If you are 79-60 years old, ask your health care provider if you should take aspirin to prevent strokes.  . Use sunscreen. Apply sunscreen liberally and repeatedly throughout the day. You should seek shade when your shadow is shorter than you. Protect yourself by wearing long sleeves, pants, a wide-brimmed hat, and sunglasses year round, whenever you are outdoors.  . Once a month, do a whole body skin exam, using a mirror to look at the skin on your back. Tell your health care provider of new moles, moles that have irregular borders, moles that are larger than a pencil eraser, or moles that have changed in shape or color.

## 2017-03-08 NOTE — Progress Notes (Signed)
Pre visit review using our clinic review tool, if applicable. No additional management support is needed unless otherwise documented below in the visit note. 

## 2017-03-11 NOTE — Progress Notes (Signed)
I reviewed health advisor's note, was available for consultation, and agree with documentation and plan.   Signed,  Araiyah Cumpton T. Odessa Nishi, MD  

## 2017-03-14 ENCOUNTER — Encounter: Payer: Self-pay | Admitting: Family Medicine

## 2017-03-14 ENCOUNTER — Ambulatory Visit (INDEPENDENT_AMBULATORY_CARE_PROVIDER_SITE_OTHER): Payer: Medicare Other | Admitting: Family Medicine

## 2017-03-14 VITALS — BP 144/94 | HR 57 | Temp 98.1°F | Ht 59.75 in | Wt 168.5 lb

## 2017-03-14 DIAGNOSIS — F5104 Psychophysiologic insomnia: Secondary | ICD-10-CM | POA: Diagnosis not present

## 2017-03-14 DIAGNOSIS — F321 Major depressive disorder, single episode, moderate: Secondary | ICD-10-CM | POA: Diagnosis not present

## 2017-03-14 DIAGNOSIS — E785 Hyperlipidemia, unspecified: Secondary | ICD-10-CM

## 2017-03-14 DIAGNOSIS — I10 Essential (primary) hypertension: Secondary | ICD-10-CM | POA: Diagnosis not present

## 2017-03-14 DIAGNOSIS — Z0000 Encounter for general adult medical examination without abnormal findings: Secondary | ICD-10-CM

## 2017-03-14 DIAGNOSIS — F0390 Unspecified dementia without behavioral disturbance: Secondary | ICD-10-CM | POA: Diagnosis not present

## 2017-03-14 DIAGNOSIS — F03A0 Unspecified dementia, mild, without behavioral disturbance, psychotic disturbance, mood disturbance, and anxiety: Secondary | ICD-10-CM

## 2017-03-14 LAB — HM DIABETES FOOT EXAM

## 2017-03-14 NOTE — Assessment & Plan Note (Signed)
Work on Tree surgeonhealthy sleep hygiene. Start melatonin  3 mg at bedtime..  If not improving sleep after 2-3 weeks.. Call for trial of trazodone.

## 2017-03-14 NOTE — Assessment & Plan Note (Signed)
Well controlled. Continue current medication.  

## 2017-03-14 NOTE — Progress Notes (Signed)
Subjective:    Patient ID: Crystal Newton, female    DOB: 07/04/1945, 71 y.o.   MRN: [id number]  HPI  The patient presents for  complete physical and review of chronic health problems. He/She also has the following acute concerns today: She  Is not sleeping well at night, cannot fall asleep when she lies down. On going x several month. Has had similar in past that just went away with time.  The patient saw Lu Duffelarlesia Pinson, LPN for medicare wellness. Note reviewed in detail and important notes copied below. Health maintenance:  Cologuard - PCP please address at next appt Tetanus - postponed/insurance A1C - completed  Abnormal screenings:  Mini-Cog score: 19/20 Depression score: 14 Fall risk - hx of 2 falls with injury but no medical treatment Hearing - failed             Hearing Screening   125Hz  250Hz  500Hz  1000Hz  2000Hz  3000Hz  4000Hz  6000Hz  8000Hz   Right ear:   0 0 40  0    Left ear:   0 0 40  0      03/14/17 Today:  Diabetes:   In nml range on no med. Lab Results  Component Value Date   HGBA1C 5.5 03/08/2017  Using medications without difficulties: Hypoglycemic episodes: Hyperglycemic episodes: Feet problems: none Blood Sugars averaging: not checking eye exam within last year: yes  Hypertension:   Inadequate control today on  ACEI and inderal. Was well controlled last week. BP Readings from Last 3 Encounters:  03/14/17 (!) 144/94  03/08/17 108/80  09/09/16 110/78  Using medication without problems or lightheadedness:  none Chest pain with exertion: none Edema: none Short of breath:none Average home BPs: Other issues: runny nose  Elevated Cholesterol: At goal on high intensity statin Lab Results  Component Value Date   CHOL 156 03/08/2017   HDL 59.50 03/08/2017   LDLCALC 83 03/08/2017   LDLDIRECT 214.9 12/14/2012   TRIG 66.0 03/08/2017   CHOLHDL 3 03/08/2017  Using medications without problems: none Muscle aches:  none Diet  compliance: moderate Exercise: none Other complaints:  Dementia:  Stable on aricept  MDD, moderate/GAD: She reports fairly good control on current dose of fluoxetine. She does not feel this is the cause of her insomnia.   Social History /Family History/Past Medical History reviewed in detail and updated in EMR if needed. Blood pressure (!) 144/94, pulse (!) 57, temperature 98.1 F (36.7 C), temperature source Oral, height 4' 11.75" (1.518 m), weight 168 lb 8 oz (76.4 kg), SpO2 96 %.   Review of Systems  Constitutional: Negative for fatigue and fever.  HENT: Negative for congestion.   Eyes: Negative for pain.  Respiratory: Negative for cough and shortness of breath.   Cardiovascular: Negative for chest pain, palpitations and leg swelling.  Gastrointestinal: Negative for abdominal pain.  Genitourinary: Negative for dysuria and vaginal bleeding.  Musculoskeletal: Negative for back pain.  Neurological: Negative for syncope, light-headedness and headaches.  Psychiatric/Behavioral: Negative for dysphoric mood.       Objective:   Physical Exam  Constitutional: Vital signs are normal. She appears well-developed and well-nourished. She is cooperative.  Non-toxic appearance. She does not appear ill. No distress.  HENT:  Head: Normocephalic.  Right Ear: Hearing, tympanic membrane, external ear and ear canal normal.  Left Ear: Hearing, tympanic membrane, external ear and ear canal normal.  Nose: Nose normal.  Eyes: Conjunctivae, EOM and lids are normal. Pupils are equal, round, and reactive to light. Lids are everted  and swept, no foreign bodies found.  Neck: Trachea normal and normal range of motion. Neck supple. Carotid bruit is not present. No thyroid mass and no thyromegaly present.  Cardiovascular: Normal rate, regular rhythm, S1 normal, S2 normal, normal heart sounds and intact distal pulses. Exam reveals no gallop.  No murmur heard. Pulmonary/Chest: Effort normal and breath sounds  normal. No respiratory distress. She has no wheezes. She has no rhonchi. She has no rales.  Abdominal: Soft. Normal appearance and bowel sounds are normal. She exhibits no distension, no fluid wave, no abdominal bruit and no mass. There is no hepatosplenomegaly. There is no tenderness. There is no rebound, no guarding and no CVA tenderness. No hernia.  Lymphadenopathy:    She has no cervical adenopathy.    She has no axillary adenopathy.  Neurological: She is alert. She has normal strength. No cranial nerve deficit or sensory deficit.  Skin: Skin is warm, dry and intact. No rash noted.  Psychiatric: Her speech is normal and behavior is normal. Judgment normal. Her mood appears not anxious. Cognition and memory are normal. She does not exhibit a depressed mood.   Diabetic foot exam: Normal inspection No skin breakdown No calluses  Normal DP pulses Normal sensation to light touch and monofilament Nails normal      Assessment & Plan:  The patient's preventative maintenance and recommended screening tests for an annual wellness exam were reviewed in full today. Brought up to date unless services declined.  Counselled on the importance of diet, exercise, and its role in overall health and mortality. The patient's FH and SH was reviewed, including their home life, tobacco status, and drug and alcohol status.   Vaccines: Uptodate with  Flu,prevnar and pneumovax, shingles, td vaccine not covered by insurance.  Colon: Last colon[place] 2002 [place]. Was due in 2012. Plan cologuard. DVE/pap: partial hysterectomy, has both ovaries. Asymptomatic, no family history. Nml DVE 2012, not interested in further DVE unless symptomatic.  Mammo:  nml 2017, repeatevery 2 years DEXA:   No family history. 2017 overall hip osteopenia, neck osteoporosis.. Repeat in 2 years HEP C:   done

## 2017-03-14 NOTE — Patient Instructions (Addendum)
Work on healthy sleep hygeine. Start melatonin  3 mg at bedtime..  If not improving sleep after 2-3 weeks.. Call for trial of trazodone. Follow BP at home.. Call if > 140/90 regularly.  Return cologuard once received.

## 2017-03-14 NOTE — Assessment & Plan Note (Signed)
Per pt stable control on aricept low dose.. May be slightly worsened lately.. Will improve sleep then re-eval at next 6 month OV.

## 2017-03-14 NOTE — Assessment & Plan Note (Signed)
Stable control on current dose of fluoxetine.

## 2017-03-30 ENCOUNTER — Other Ambulatory Visit: Payer: Self-pay | Admitting: Family Medicine

## 2017-04-13 DIAGNOSIS — H2513 Age-related nuclear cataract, bilateral: Secondary | ICD-10-CM | POA: Diagnosis not present

## 2017-04-13 LAB — HM DIABETES EYE EXAM

## 2017-04-24 ENCOUNTER — Other Ambulatory Visit: Payer: Self-pay | Admitting: *Deleted

## 2017-04-24 ENCOUNTER — Encounter: Payer: Self-pay | Admitting: Family Medicine

## 2017-04-24 MED ORDER — DONEPEZIL HCL 5 MG PO TABS: ORAL_TABLET | ORAL | 3 refills | Status: DC

## 2017-05-08 DIAGNOSIS — Z1212 Encounter for screening for malignant neoplasm of rectum: Secondary | ICD-10-CM | POA: Diagnosis not present

## 2017-05-08 DIAGNOSIS — Z1211 Encounter for screening for malignant neoplasm of colon: Secondary | ICD-10-CM | POA: Diagnosis not present

## 2017-05-08 LAB — COLOGUARD: Cologuard: NEGATIVE

## 2017-05-10 ENCOUNTER — Other Ambulatory Visit: Payer: Self-pay | Admitting: *Deleted

## 2017-05-10 MED ORDER — FLUOXETINE HCL 40 MG PO CAPS: ORAL_CAPSULE | ORAL | 1 refills | Status: DC

## 2017-05-11 ENCOUNTER — Other Ambulatory Visit: Payer: Self-pay | Admitting: *Deleted

## 2017-05-11 MED ORDER — LISINOPRIL 5 MG PO TABS: ORAL_TABLET | ORAL | 1 refills | Status: DC

## 2017-05-18 ENCOUNTER — Encounter: Payer: Self-pay | Admitting: Family Medicine

## 2017-09-05 ENCOUNTER — Telehealth: Payer: Self-pay | Admitting: Family Medicine

## 2017-09-05 DIAGNOSIS — E119 Type 2 diabetes mellitus without complications: Secondary | ICD-10-CM

## 2017-09-05 DIAGNOSIS — E785 Hyperlipidemia, unspecified: Secondary | ICD-10-CM

## 2017-09-05 NOTE — Telephone Encounter (Signed)
Labs ordered.

## 2017-09-07 ENCOUNTER — Telehealth: Payer: Self-pay

## 2017-09-07 ENCOUNTER — Ambulatory Visit (INDEPENDENT_AMBULATORY_CARE_PROVIDER_SITE_OTHER)
Admission: RE | Admit: 2017-09-07 | Discharge: 2017-09-07 | Disposition: A | Discharge status: Home / Self Care | Payer: Medicare Other | Source: Ambulatory Visit | Attending: Family Medicine | Admitting: Family Medicine

## 2017-09-07 ENCOUNTER — Encounter: Payer: Self-pay | Admitting: *Deleted

## 2017-09-07 ENCOUNTER — Ambulatory Visit (INDEPENDENT_AMBULATORY_CARE_PROVIDER_SITE_OTHER): Payer: Medicare Other | Admitting: Family Medicine

## 2017-09-07 ENCOUNTER — Other Ambulatory Visit (INDEPENDENT_AMBULATORY_CARE_PROVIDER_SITE_OTHER): Payer: Medicare Other

## 2017-09-07 ENCOUNTER — Encounter: Payer: Self-pay | Admitting: Family Medicine

## 2017-09-07 VITALS — BP 118/71 | HR 61 | Temp 97.9°F | Ht 59.75 in

## 2017-09-07 DIAGNOSIS — S52501A Unspecified fracture of the lower end of right radius, initial encounter for closed fracture: Secondary | ICD-10-CM | POA: Diagnosis not present

## 2017-09-07 DIAGNOSIS — M79631 Pain in right forearm: Secondary | ICD-10-CM

## 2017-09-07 DIAGNOSIS — S52121A Displaced fracture of head of right radius, initial encounter for closed fracture: Secondary | ICD-10-CM | POA: Diagnosis not present

## 2017-09-07 DIAGNOSIS — S59911A Unspecified injury of right forearm, initial encounter: Secondary | ICD-10-CM | POA: Diagnosis not present

## 2017-09-07 DIAGNOSIS — M898X2 Other specified disorders of bone, upper arm: Secondary | ICD-10-CM

## 2017-09-07 DIAGNOSIS — M79621 Pain in right upper arm: Secondary | ICD-10-CM | POA: Diagnosis not present

## 2017-09-07 DIAGNOSIS — E119 Type 2 diabetes mellitus without complications: Secondary | ICD-10-CM | POA: Diagnosis not present

## 2017-09-07 DIAGNOSIS — M25521 Pain in right elbow: Secondary | ICD-10-CM

## 2017-09-07 LAB — COMPREHENSIVE METABOLIC PANEL
ALK PHOS: 100 U/L (ref 39–117)
ALT: 22 U/L (ref 0–35)
AST: 21 U/L (ref 0–37)
Albumin: 4.2 g/dL (ref 3.5–5.2)
BUN: 28 mg/dL — ABNORMAL HIGH (ref 6–23)
CO2: 29 meq/L (ref 19–32)
CREATININE: 0.59 mg/dL (ref 0.40–1.20)
Calcium: 10 mg/dL (ref 8.4–10.5)
Chloride: 106 mEq/L (ref 96–112)
GFR: 106.43 mL/min (ref >60.00)
GLUCOSE: 109 mg/dL — AB (ref 70–99)
Potassium: 4.5 mEq/L (ref 3.5–5.1)
Sodium: 141 mEq/L (ref 135–145)
TOTAL PROTEIN: 6.3 g/dL (ref 6.0–8.3)
Total Bilirubin: 0.5 mg/dL (ref 0.2–1.2)

## 2017-09-07 LAB — LIPID PANEL
CHOL/HDL RATIO: 3
Cholesterol: 166 mg/dL (ref 0–200)
HDL: 66 mg/dL (ref >39.00)
LDL Cholesterol: 87 mg/dL (ref 0–99)
NONHDL: 99.77
Triglycerides: 62 mg/dL (ref 0.0–149.0)
VLDL: 12.4 mg/dL (ref 0.0–40.0)

## 2017-09-07 LAB — HEMOGLOBIN A1C: HEMOGLOBIN A1C: 5.7 % (ref 4.6–6.5)

## 2017-09-07 NOTE — Telephone Encounter (Deleted)
At approximately 945am, this writer was contacted by Terri Walsh (lab/xray) from the lab area after patient fell.    Patient reports tripping over her shoes as she was turning to come out of the lab area and fell directly on Right side, does not recall hitting head. I immediately responded with the following assessment:  *Before moving patient - Pain and ROM assessed to all major joint areas and neurologic status determined WNL. Patient then transferred to a wheelchair and taken to treatment room for full skin/body assessment. Denies any chest pain, SOB, weakness, numbness or tingling to extremities, speech is clear is Alert and Oriented X 3.    VS:  120/78, 61, 97.9 o2 sat 95% FSBS: 118 Pain rating: 2-3, but worse with movement to right shoulder/arm.    1. Right, mild carpet burn red/pink area to right cheek.  No pain with palpation of facial bones and no evidence of redness, scrapes, cuts, bruises or swelling to face or scalp area.  All Neuro checks WNL, patient is not on a blood thinner.   2.  B Hips, knees, feet/ankles present with normal ROM, only slight "soreness" to right knee but free of any redness, swelling, bruising or discomfort with palpation.  Note: patient does present with a fluid/flesh pocket below right knee not red but warm to the touch, patient and husband say this has been there prior to fall and has been dx as a "fatty tumor".    3.Positive pain/tenderness with R shoulder elevation and pronation of the R arm described as "pulling tightness" in the radial area of the forearm and wrist.  L shoulder, arm, wrist WNL strength and ROM.    4. Unsteady Gait/Ambulation - following fall patient is very unsteady on her feet and c/o feeling woozy.  Vital signs are normal; however, did request of Dr. Copland's assistant to check orthostatics once patient is back in the exam room.  Did discuss concern with patient's open back clog shoes and suggested that she get new, full heel back shoes to  decrease risk of fall in the future as clogs can slide and make her more at risk for falls. Also, may want to consider an assistive device as patient mentions that she has had frequent falls.    Patient denies any recent changes in diet health or medications.     Consulted with Dr. Copland as Dr. Bedsole is out of the office.  He will see patient and examine further to determine proper tx plan of care moving forward. Patient added to schedule and checked in to be seen within the hour.   Patient made aware and given fluids to drink in effort to improve instability.  Did offer patient crackers along with the water but she refused.  Husband present and supportive. They will wait in treatment room until appointment with frequent checks from staff in the interim.   

## 2017-09-07 NOTE — Telephone Encounter (Signed)
At approximately 945am, this Clinical research associate was contacted by Mills Koller (lab/xray) from the lab area after patient fell.    Patient reports tripping over her shoes as she was turning to come out of the lab area and fell directly on Right side, does not recall hitting head. I immediately responded with the following assessment:  *Before moving patient - Pain and ROM assessed to all major joint areas and neurologic status determined WNL. Patient then transferred to a wheelchair and taken to treatment room for full skin/body assessment. Denies any chest pain, SOB, weakness, numbness or tingling to extremities, speech is clear is Alert and Oriented X 3.    VS:  120/78, 61, 97.9 o2 sat 95% FSBS: 118 Pain rating: 2-3, but worse with movement to right shoulder/arm.    1. Right, mild carpet burn red/pink area to right cheek.  No pain with palpation of facial bones and no evidence of redness, scrapes, cuts, bruises or swelling to face or scalp area.  All Neuro checks WNL, patient is not on a blood thinner.   2.  B Hips, knees, feet/ankles present with normal ROM, only slight "soreness" to right knee but free of any redness, swelling, bruising or discomfort with palpation.  Note: patient does present with a fluid/flesh pocket below right knee not red but warm to the touch, patient and husband say this has been there prior to fall and has been dx as a "fatty tumor".    3.Positive pain/tenderness with R shoulder elevation and pronation of the R arm described as "pulling tightness" in the radial area of the forearm and wrist.  L shoulder, arm, wrist WNL strength and ROM.    4. Unsteady Gait/Ambulation - following fall patient is very unsteady on her feet and c/o feeling woozy.  Vital signs are normal; however, did request of Dr. Cyndie Chime assistant to check orthostatics once patient is back in the exam room.  Did discuss concern with patient's open back clog shoes and suggested that she get new, full heel back shoes to  decrease risk of fall in the future as clogs can slide and make her more at risk for falls. Also, may want to consider an assistive device as patient mentions that she has had frequent falls.    Patient denies any recent changes in diet health or medications.     Consulted with Dr. Patsy Lager as Dr. Ermalene Searing is out of the office.  He will see patient and examine further to determine proper tx plan of care moving forward. Patient added to schedule and checked in to be seen within the hour.   Patient made aware and given fluids to drink in effort to improve instability.  Did offer patient crackers along with the water but she refused.  Husband present and supportive. They will wait in treatment room until appointment with frequent checks from staff in the interim.

## 2017-09-07 NOTE — Patient Instructions (Signed)
X-rays next week after physical (or before)

## 2017-09-07 NOTE — Progress Notes (Signed)
Dr. Karleen Hampshire T. Tarea Skillman, MD, CAQ Sports Medicine Primary Care and Sports Medicine [address] [place], [address] Phone: [phone] Fax: [phone]  09/07/2017  Patient: Crystal Newton, MRN: [id number], DOB: 11-01-1945, 72 y.o.  Primary Physician:  Excell Seltzer, MD   Chief Complaint  Patient presents with  . Fall  . Arm Pain    Right   Subjective:   Crystal Newton is a 72 y.o. very pleasant female patient who presents with the following:  DOI: 09/07/2017  Larey Seat today at our office. Coming out of door and caught her shoe. Also hit the R side of her face.   She is neurologically intact right now, she is not having any headaches, dizziness, blurred vision, nauseousness, facial weakness, drooping, or any numbness or tingling anywhere in her body.  She is having pain in her right arm, but she is having some difficulty focally telling exactly where it is, having some pain in the distal humerus as well as in the proximal forearm and elbow region.  Past Medical History, Surgical History, Social History, Family History, Problem List, Medications, and Allergies have been reviewed and updated if relevant.  Patient Active Problem List   Diagnosis Date Noted  . Chronic insomnia 03/14/2017  . Digital mucinous cyst of finger of right hand 09/09/2016  . Bilateral hip bursitis 03/08/2016  . Counseling regarding end of life decision making 02/03/2015  . Mild dementia 06/14/2012  . OSTEOARTHROSIS UNSPEC WHETHER GEN/LOCALIZED HAND 01/14/2008  . Diabetes mellitus with no complication (HCC) 12/21/2006  . Hyperlipidemia LDL goal <100 12/21/2006  . Generalized anxiety disorder 12/21/2006  . Major depressive disorder, single episode, moderate (HCC) 12/21/2006  . COMMON MIGRAINE 12/21/2006  . Essential hypertension, benign 12/21/2006  . PUD 12/21/2006    Past Medical History:  Diagnosis Date  . Anxiety   . Depression   . Diabetes mellitus   . Hyperlipidemia   . Hypertension   .  Kidney stones     Past Surgical History:  Procedure Laterality Date  . ABDOMINAL HYSTERECTOMY    . BREAST BIOPSY    . PARTIAL COLECTOMY  1996   ruptured abcess for diverticulitis  . PARTIAL HYSTERECTOMY     uterine prolapse  . right parotid      Social History   Socioeconomic History  . Marital status: Married    Spouse name: Not on file  . Number of children: Not on file  . Years of education: Not on file  . Highest education level: Not on file  Occupational History  . Occupation: day care    Employer: RETIRED  Social Needs  . Financial resource strain: Not on file  . Food insecurity:    Worry: Not on file    Inability: Not on file  . Transportation needs:    Medical: Not on file    Non-medical: Not on file  Tobacco Use  . Smoking status: Never Smoker  . Smokeless tobacco: Never Used  Substance and Sexual Activity  . Alcohol use: No    Alcohol/week: 0.0 oz  . Drug use: No  . Sexual activity: Not on file  Lifestyle  . Physical activity:    Days per week: Not on file    Minutes per session: Not on file  . Stress: Not on file  Relationships  . Social connections:    Talks on phone: Not on file    Gets together: Not on file    Attends religious service: Not on  file    Active member of club or organization: Not on file    Attends meetings of clubs or organizations: Not on file    Relationship status: Not on file  . Intimate partner violence:    Fear of current or ex partner: Not on file    Emotionally abused: Not on file    Physically abused: Not on file    Forced sexual activity: Not on file  Other Topics Concern  . Not on file  Social History Narrative   Regular exercise--no, but walking on weekends.      Diet-- healthy, low carb, working on weight loss   No living will. Full Code. (Reviewed 2013)    Family History  Problem Relation Age of Onset  . Alzheimer's disease Mother   . Diabetes Maternal Grandfather   . Coronary artery disease Maternal  Grandfather     No Known Allergies  Medication list reviewed and updated in full in [facility].  GEN: No fevers, chills. Nontoxic. Primarily MSK c/o today. MSK: Detailed in the HPI GI: tolerating PO intake without difficulty Neuro: No numbness, parasthesias, or tingling associated. Otherwise the pertinent positives of the ROS are noted above.   Objective:   BP 118/71   Pulse 61   Temp 97.9 F (36.6 C) (Oral)   Ht 4' 11.75" (1.518 m)   SpO2 95%   BMI 33.18 kg/m    GEN: WDWN, NAD, Non-toxic, A & O x 3 HEENT: Atraumatic, Normocephalic. Neck supple. No masses, No LAD. Ears and Nose: No external deformity. ABD: S, NT, ND, +BS. No rebound tenderness. No HSM.  EXTR: No c/c/e Neuro: CN 2-12 grossly intact. PERRLA. EOMI. Sensation intact throughout. Str 5/5 all extremities.A and o x 4. Romberg neg.  PSYCH: Normally interactive. Conversant. Not depressed or anxious appearing.  Calm demeanor.    Range of motion is full of the shoulder.  She is nontender along the clavicle, nontender at the [facility] joint, nontender in the proximal humerus. Strength testing is 5/5 in all directions at the shoulder.  Patient does have some pain in the distal humerus.  She has some pain around the olecranon, the radial head, as well as the medial lateral epicondyle.  There is no significant pain in the distal radius or distal ulna, as well as the entirety of the hand and wrist region.  There is some pain as the proximal radius and ulna are approached with deep palpation.  At the elbow there is some pain with rotation, as well as decreased extension by about 10 to 12 degrees.  Radiology: Dg Elbow Complete Right (3+view)  Result Date: 09/07/2017 CLINICAL DATA:  Fall.  Right elbow pain. EXAM: RIGHT ELBOW - COMPLETE 3+ VIEW COMPARISON:  No prior. FINDINGS: Slightly displaced fracture of the radial head is noted. Extension into the joint space may be present. Ulna appears to be intact. Distal humerus  appears to be intact. Diffuse degenerative change. IMPRESSION: 1. Slightly displaced fracture of the radial head noted. Extension into the joint space may be present. 2.  Degenerative changes right elbow. Electronically Signed   By: Maisie Fus  Register   On: 09/07/2017 11:42   Dg Forearm Right  Result Date: 09/07/2017 CLINICAL DATA:  Larey Seat 1 hour ago with pain EXAM: RIGHT FOREARM - 2 VIEW COMPARISON:  None. FINDINGS: There is very slight angulation of the neck of the proximal right radius and a minimally impacted right radial head fracture is difficult to exclude. Complete right elbow films are  recommended to assess further. Otherwise the radius and ulna appear intact and normal in alignment. The radiocarpal joint space is unremarkable. IMPRESSION: 1. Difficult to exclude a subtle minimally impacted fracture of the right radial neck. Recommend recommend complete right elbow films be obtained. 2. No other acute abnormality Electronically Signed   By: Dwyane Dee M.D.   On: 09/07/2017 11:41   Dg Humerus Right  Result Date: 09/07/2017 CLINICAL DATA:  Fall EXAM: RIGHT HUMERUS - 2+ VIEW COMPARISON:  None. FINDINGS: Frontal and lateral views obtained. There is a fracture of the proximal radial metaphysis with alignment near anatomic. No humerus fracture evident. No dislocation. Joint spaces appear unremarkable. IMPRESSION: Fracture proximal radial metaphysis with alignment near anatomic. No other fracture. No dislocation. No appreciable arthropathic change. Electronically Signed   By: Bretta Bang III M.D.   On: 09/07/2017 11:42     Assessment and Plan:   Closed displaced fracture of head of right radius, initial encounter  Pain of right humerus - Plan: DG Humerus Right  Right elbow pain - Plan: DG ELBOW COMPLETE RIGHT (3+VIEW)  Right forearm pain - Plan: DG Forearm Right  There does appear to be a near anatomic radial neck fracture.  I placed the patient in the sling, and I am going to have her do  flexion and extension 3 or 4 times a day.  She is seeing my partner for a physical in 7 days time, and that is been a repeat elbow x-rays at that time.  After this she is going to see me in approximately 2 weeks time from today.  Orders Placed This Encounter  Procedures  . DG Forearm Right  . DG ELBOW COMPLETE RIGHT (3+VIEW)  . DG Humerus Right    Signed,  Karleen Hampshire T. Bralon Antkowiak, MD   Allergies as of 09/07/2017   No Known Allergies     Medication List        Accurate as of 09/07/17  1:43 PM. Always use your most recent med list.          atorvastatin 80 MG tablet Commonly known as:  LIPITOR TAKE 1 TABLET BY MOUTH DAILY.   CENTRUM SILVER PO Take 1 tablet by mouth daily.   donepezil 5 MG tablet Commonly known as:  ARICEPT TAKE 1 TABLET (5 MG TOTAL) BY MOUTH NIGHTLY.   esomeprazole 40 MG capsule Commonly known as:  NEXIUM Take 40 mg by mouth daily at 12 noon.   FLUoxetine 40 MG capsule Commonly known as:  PROZAC TAKE 1 CAPSULE (40 MG TOTAL) BY MOUTH DAILY.   lisinopril 5 MG tablet Commonly known as:  PRINIVIL,ZESTRIL TAKE 1 TABLET (5 MG TOTAL) BY MOUTH DAILY.   propranolol 40 MG tablet Commonly known as:  INDERAL TAKE 1 TABLET BY MOUTH 2 (TWO) TIMES DAILY.

## 2017-09-13 ENCOUNTER — Other Ambulatory Visit: Payer: Self-pay | Admitting: Family Medicine

## 2017-09-13 DIAGNOSIS — S52134D Nondisplaced fracture of neck of right radius, subsequent encounter for closed fracture with routine healing: Secondary | ICD-10-CM

## 2017-09-13 NOTE — Progress Notes (Signed)
future

## 2017-09-14 ENCOUNTER — Ambulatory Visit: Payer: Medicare Other | Admitting: Family Medicine

## 2017-09-15 ENCOUNTER — Ambulatory Visit (INDEPENDENT_AMBULATORY_CARE_PROVIDER_SITE_OTHER): Payer: Medicare Other | Admitting: Family Medicine

## 2017-09-15 ENCOUNTER — Other Ambulatory Visit: Payer: Self-pay

## 2017-09-15 ENCOUNTER — Encounter: Payer: Self-pay | Admitting: Family Medicine

## 2017-09-15 VITALS — BP 110/80 | HR 60 | Temp 98.5°F | Ht 59.75 in | Wt 158.8 lb

## 2017-09-15 DIAGNOSIS — F321 Major depressive disorder, single episode, moderate: Secondary | ICD-10-CM | POA: Diagnosis not present

## 2017-09-15 DIAGNOSIS — F5104 Psychophysiologic insomnia: Secondary | ICD-10-CM

## 2017-09-15 DIAGNOSIS — E119 Type 2 diabetes mellitus without complications: Secondary | ICD-10-CM

## 2017-09-15 DIAGNOSIS — I10 Essential (primary) hypertension: Secondary | ICD-10-CM | POA: Diagnosis not present

## 2017-09-15 DIAGNOSIS — E785 Hyperlipidemia, unspecified: Secondary | ICD-10-CM | POA: Diagnosis not present

## 2017-09-15 MED ORDER — FLUOXETINE HCL 20 MG PO TABS: 60.0000 mg | ORAL_TABLET | Freq: Every day | ORAL | 1 refills | Status: DC

## 2017-09-15 NOTE — Assessment & Plan Note (Signed)
Well controlled. Continue current medication.  

## 2017-09-15 NOTE — Progress Notes (Signed)
Subjective:    Patient ID: Maximiano CossJoyce S Dirico, female    DOB: 1945/07/31, 72 y.o.   MRN: [id number]  HPI   72 year old female presents for follow up 6 month.   She has been under a lot stress lately. Mother in law in nursing home.  Right radial head fracture.    Diabetes:  great control no med. Lab Results  Component Value Date   HGBA1C 5.7 09/07/2017  Using medications without difficulties:none Hypoglycemic episodes: Hyperglycemic episodes: Feet problems: none Blood Sugars averaging: not checking eye exam within last year: yes 03/2017  Hypertension:   good control on  ACEI and inderal.  BP Readings from Last 3 Encounters:  09/15/17 110/80  09/07/17 118/71  03/14/17 (!) 144/94  Using medication without problems or lightheadedness:  none Chest pain with exertion: none Edema: none Short of breath:none Average home BPs: Other issues: runny nose  Elevated Cholesterol: At goal on high intensity statin Lab Results  Component Value Date   CHOL 166 09/07/2017   HDL 66.00 09/07/2017   LDLCALC 87 09/07/2017   LDLDIRECT 214.9 12/14/2012   TRIG 62.0 09/07/2017   CHOLHDL 3 09/07/2017  Using medications without problems: none Muscle aches:  none Diet compliance: moderate, decreased appetite Exercise: none Other complaints:  Dementia:  Stable on aricept  MDD, moderate/GAD: She reports  Inadequate control on current dose of fluoxetine 40 mg ( has been on for years).  PHQ9 16  Chronic insomnia:  Melatonin has helped with sleep. Sleeps  6 hour of sleep a night.  Social History /Family History/Past Medical History reviewed in detail and updated in EMR if needed. Blood pressure 110/80, pulse 60, temperature 98.5 F (36.9 C), temperature source Oral, height 4' 11.75" (1.518 m), weight 158 lb 12 oz (72 kg).    Review of Systems  Constitutional: Positive for fatigue. Negative for fever.  HENT: Negative for ear pain.   Eyes: Negative for pain.  Respiratory: Negative for  chest tightness and shortness of breath.   Cardiovascular: Negative for chest pain, palpitations and leg swelling.  Gastrointestinal: Negative for abdominal pain.  Genitourinary: Negative for dysuria.       Objective:   Physical Exam  Constitutional: Vital signs are normal. She appears well-developed and well-nourished. She is cooperative.  Non-toxic appearance. She does not appear ill. No distress.  HENT:  Head: Normocephalic.  Right Ear: Hearing, tympanic membrane, external ear and ear canal normal. Tympanic membrane is not erythematous, not retracted and not bulging.  Left Ear: Hearing, tympanic membrane, external ear and ear canal normal. Tympanic membrane is not erythematous, not retracted and not bulging.  Nose: No mucosal edema or rhinorrhea. Right sinus exhibits no maxillary sinus tenderness and no frontal sinus tenderness. Left sinus exhibits no maxillary sinus tenderness and no frontal sinus tenderness.  Mouth/Throat: Uvula is midline, oropharynx is clear and moist and mucous membranes are normal.  Eyes: Pupils are equal, round, and reactive to light. Conjunctivae, EOM and lids are normal. Lids are everted and swept, no foreign bodies found.  Neck: Trachea normal and normal range of motion. Neck supple. Carotid bruit is not present. No thyroid mass and no thyromegaly present.  Cardiovascular: Normal rate, regular rhythm, S1 normal, S2 normal, normal heart sounds, intact distal pulses and normal pulses. Exam reveals no gallop and no friction rub.  No murmur heard. Pulmonary/Chest: Effort normal and breath sounds normal. No tachypnea. No respiratory distress. She has no decreased breath sounds. She has no wheezes. She has no  rhonchi. She has no rales.  Abdominal: Soft. Normal appearance and bowel sounds are normal. There is no tenderness.  Musculoskeletal:  Right arm in sling  Neurological: She is alert.  Skin: Skin is warm, dry and intact. No rash noted.  Psychiatric: Her speech is  normal. Judgment and thought content normal. Her mood appears not anxious. Her affect is blunt. She is slowed. She exhibits a depressed mood. She exhibits abnormal recent memory.    Diabetic foot exam: Normal inspection No skin breakdown No calluses  Normal DP pulses Normal sensation to light touch and monofilament Nails normal       Assessment & Plan:

## 2017-09-15 NOTE — Patient Instructions (Signed)
Work on getting more active as able.  Increase fluoxetine to 3 capsules 20 mg at bedtime.

## 2017-09-15 NOTE — Assessment & Plan Note (Signed)
Inadequate control.. Increase to prozac 60 mg daily. Follow up in 4 weeks.

## 2017-09-15 NOTE — Assessment & Plan Note (Signed)
Good control on no medication. 

## 2017-09-15 NOTE — Assessment & Plan Note (Signed)
Improved with melatonin. 

## 2017-09-18 ENCOUNTER — Telehealth: Payer: Self-pay

## 2017-09-18 MED ORDER — FLUOXETINE HCL 20 MG PO CAPS: 60.0000 mg | ORAL_CAPSULE | Freq: Every day | ORAL | 3 refills | Status: DC

## 2017-09-18 NOTE — Telephone Encounter (Signed)
Prescription sent in for Fluoxetine 20 mg capsules.

## 2017-09-18 NOTE — Telephone Encounter (Signed)
Copied from CRM [phone]#[id number]. Topic: General - Other >> Sep 18, 2017  9:35 AM Marylen PontoMcneil, Ja-Kwan wrote: Reason for CRM: Jo with [facility] states the FLUoxetine (PROZAC) 20 MG tablet requires prior authorization however if the Rx is switched to the capsules there is no prior authorization needed. Cb# [id number]

## 2017-09-21 ENCOUNTER — Ambulatory Visit (INDEPENDENT_AMBULATORY_CARE_PROVIDER_SITE_OTHER): Payer: Medicare Other | Admitting: Family Medicine

## 2017-09-21 ENCOUNTER — Encounter: Payer: Self-pay | Admitting: Family Medicine

## 2017-09-21 ENCOUNTER — Ambulatory Visit (INDEPENDENT_AMBULATORY_CARE_PROVIDER_SITE_OTHER)
Admission: RE | Admit: 2017-09-21 | Discharge: 2017-09-21 | Disposition: A | Discharge status: Home / Self Care | Payer: Medicare Other | Source: Ambulatory Visit | Attending: Family Medicine | Admitting: Family Medicine

## 2017-09-21 DIAGNOSIS — S52134D Nondisplaced fracture of neck of right radius, subsequent encounter for closed fracture with routine healing: Secondary | ICD-10-CM

## 2017-09-21 DIAGNOSIS — S52131A Displaced fracture of neck of right radius, initial encounter for closed fracture: Secondary | ICD-10-CM | POA: Diagnosis not present

## 2017-09-21 NOTE — Progress Notes (Signed)
Dr. Karleen HampshireSpencer T. Marckus Hanover, MD, CAQ Sports Medicine Primary Care and Sports Medicine [address] [place], [id number] Phone: [phone] Fax: [phone]  09/21/2017  Patient: Crystal Newton, MRN: [id number], DOB: Sep 02, 1945, 72 y.o.  Primary Physician:  Excell SeltzerBedsole, Amy E, MD   Chief Complaint  Patient presents with  . Follow-up    Right Elbox Fx   Subjective:   Crystal Newton is a 72 y.o. very pleasant female patient who presents with the following:  F/u R radial neck fx: DOI 09/07/2017  She is here with her husband, she is feeling much better.  She is not really having any significant pain.  The pain in her shoulder is gotten a lot better as well.  She been very compliant with doing her range of motion.  Her husband [name] is also here and provides additional history.    Past Medical History, Surgical History, Social History, Family History, Problem List, Medications, and Allergies have been reviewed and updated if relevant.  Patient Active Problem List   Diagnosis Date Noted  . Chronic insomnia 03/14/2017  . Digital mucinous cyst of finger of right hand 09/09/2016  . Bilateral hip bursitis 03/08/2016  . Counseling regarding end of life decision making 02/03/2015  . Mild dementia 06/14/2012  . OSTEOARTHROSIS UNSPEC WHETHER GEN/LOCALIZED HAND 01/14/2008  . Diabetes mellitus with no complication (HCC) 12/21/2006  . Hyperlipidemia LDL goal <100 12/21/2006  . Generalized anxiety disorder 12/21/2006  . Major depressive disorder, single episode, moderate (HCC) 12/21/2006  . COMMON MIGRAINE 12/21/2006  . Essential hypertension, benign 12/21/2006  . PUD 12/21/2006    Past Medical History:  Diagnosis Date  . Anxiety   . Depression   . Diabetes mellitus   . Hyperlipidemia   . Hypertension   . Kidney stones     Past Surgical History:  Procedure Laterality Date  . ABDOMINAL HYSTERECTOMY    . BREAST BIOPSY    . PARTIAL COLECTOMY  1996   ruptured abcess for diverticulitis    . PARTIAL HYSTERECTOMY     uterine prolapse  . right parotid      Social History   Socioeconomic History  . Marital status: Married    Spouse name: Not on file  . Number of children: Not on file  . Years of education: Not on file  . Highest education level: Not on file  Occupational History  . Occupation: day care    Employer: RETIRED  Social Needs  . Financial resource strain: Not on file  . Food insecurity:    Worry: Not on file    Inability: Not on file  . Transportation needs:    Medical: Not on file    Non-medical: Not on file  Tobacco Use  . Smoking status: Never Smoker  . Smokeless tobacco: Never Used  Substance and Sexual Activity  . Alcohol use: No    Alcohol/week: 0.0 oz  . Drug use: No  . Sexual activity: Not on file  Lifestyle  . Physical activity:    Days per week: Not on file    Minutes per session: Not on file  . Stress: Not on file  Relationships  . Social connections:    Talks on phone: Not on file    Gets together: Not on file    Attends religious service: Not on file    Active member of club or organization: Not on file    Attends meetings of clubs or organizations: Not on file  Relationship status: Not on file  . Intimate partner violence:    Fear of current or ex partner: Not on file    Emotionally abused: Not on file    Physically abused: Not on file    Forced sexual activity: Not on file  Other Topics Concern  . Not on file  Social History Narrative   Regular exercise--no, but walking on weekends.      Diet-- healthy, low carb, working on weight loss   No living will. Full Code. (Reviewed 2013)    Family History  Problem Relation Age of Onset  . Alzheimer's disease Mother   . Diabetes Maternal Grandfather   . Coronary artery disease Maternal Grandfather     No Known Allergies  Medication list reviewed and updated in full in [facility].  GEN: No fevers, chills. Nontoxic. Primarily MSK c/o today. MSK: Detailed in  the HPI GI: tolerating PO intake without difficulty Neuro: No numbness, parasthesias, or tingling associated. Otherwise the pertinent positives of the ROS are noted above.   Objective:   BP 110/74   Pulse (!) 58   Temp 98.3 F (36.8 C) (Oral)   Ht 4' 11.75" (1.518 m)   Wt 160 lb (72.6 kg)   BMI 31.51 kg/m    GEN: WDWN, NAD, Non-toxic, Alert & Oriented x 3 HEENT: Atraumatic, Normocephalic.  Ears and Nose: No external deformity. EXTR: No clubbing/cyanosis/edema NEURO: Normal gait.  PSYCH: Normally interactive. Conversant. Not depressed or anxious appearing.  Calm demeanor.    Grossly nontender to palpation in bilateral shoulders with good range of motion.  Sling is removed on the right shoulder, and the patient is able to flex and extend the right elbow fairly well with at this point still a lack of about 20 degrees of extension.  Radiology: Dg Elbow Complete Right (3+view)  Result Date: 09/21/2017 CLINICAL DATA:  Closed nondisplaced fracture of radial neck, follow-up EXAM: RIGHT ELBOW - COMPLETE 3+ VIEW COMPARISON:  09/07/2017 FINDINGS: Left radial neck fracture again noted with early evidence of sclerosis and callus formation. Fracture line remains evident. Small joint effusion persists. No additional acute bony abnormality. IMPRESSION: Right radial neck fracture with early evidence of healing. Electronically Signed   By: Charlett Nose M.D.   On: 09/21/2017 11:08    Assessment and Plan:   Closed nondisplaced fracture of neck of right radius with routine healing, subsequent encounter - Plan: DG ELBOW COMPLETE RIGHT (3+VIEW)  She is doing really well clinically, and they have been very compliant.  I discontinued her sling, and she is going to do range of motion exercises flexion and extension as well as supination and pronation 3 or 4 times a day.  We discussed that potentially about 10% of all of these fractures ultimately have a loss of terminal extension.  They understand and  the importance of range of motion.  Follow-up: Return in about 2 weeks (around 10/05/2017).  Signed,  Elpidio Galea. Tevon Berhane, MD   Allergies as of 09/21/2017   No Known Allergies     Medication List        Accurate as of 09/21/17  9:44 AM. Always use your most recent med list.          atorvastatin 80 MG tablet Commonly known as:  LIPITOR TAKE 1 TABLET BY MOUTH DAILY.   CENTRUM SILVER PO Take 1 tablet by mouth daily.   donepezil 5 MG tablet Commonly known as:  ARICEPT TAKE 1 TABLET (5 MG TOTAL) BY MOUTH  NIGHTLY.   esomeprazole 40 MG capsule Commonly known as:  NEXIUM Take 40 mg by mouth daily at 12 noon.   FLUoxetine 20 MG capsule Commonly known as:  PROZAC Take 3 capsules (60 mg total) by mouth daily.   lisinopril 5 MG tablet Commonly known as:  PRINIVIL,ZESTRIL TAKE 1 TABLET (5 MG TOTAL) BY MOUTH DAILY.   Melatonin 5 MG Caps   propranolol 40 MG tablet Commonly known as:  INDERAL TAKE 1 TABLET BY MOUTH 2 (TWO) TIMES DAILY.

## 2017-10-05 ENCOUNTER — Encounter: Payer: Self-pay | Admitting: Family Medicine

## 2017-10-05 ENCOUNTER — Ambulatory Visit (INDEPENDENT_AMBULATORY_CARE_PROVIDER_SITE_OTHER)
Admission: RE | Admit: 2017-10-05 | Discharge: 2017-10-05 | Disposition: A | Discharge status: Home / Self Care | Payer: Medicare Other | Source: Ambulatory Visit | Attending: Family Medicine | Admitting: Family Medicine

## 2017-10-05 ENCOUNTER — Ambulatory Visit (INDEPENDENT_AMBULATORY_CARE_PROVIDER_SITE_OTHER): Payer: Medicare Other | Admitting: Family Medicine

## 2017-10-05 VITALS — BP 120/82 | HR 60 | Temp 98.6°F | Ht 59.75 in | Wt 161.2 lb

## 2017-10-05 DIAGNOSIS — S52134D Nondisplaced fracture of neck of right radius, subsequent encounter for closed fracture with routine healing: Secondary | ICD-10-CM | POA: Diagnosis not present

## 2017-10-05 DIAGNOSIS — S52132A Displaced fracture of neck of left radius, initial encounter for closed fracture: Secondary | ICD-10-CM | POA: Diagnosis not present

## 2017-10-05 NOTE — Progress Notes (Signed)
Dr. Karleen HampshireSpencer T. Leaf Kernodle, MD, CAQ Sports Medicine Primary Care and Sports Medicine [address] [place], [address] Phone: [phone] Fax: [phone]  10/05/2017  Patient: Crystal Newton Rehberg, MRN: [id number], DOB: [birth year], 72 y.o.  Primary Physician:  Excell SeltzerBedsole, Amy E, MD   Chief Complaint  Patient presents with  . Follow-up    Right Elbow   Subjective:   Crystal Newton Kean is a 72 y.o. very pleasant female patient who presents with the following:  F/u R radial neck fracture: DOI 09/07/2017  Elbow motion is very good.  She does not have any pain at all at the elbow.  Her husband is been reminding her and she has been working on her range of motion at least twice a day.  Past Medical History, Surgical History, Social History, Family History, Problem List, Medications, and Allergies have been reviewed and updated if relevant.  Patient Active Problem List   Diagnosis Date Noted  . Chronic insomnia 03/14/2017  . Digital mucinous cyst of finger of right hand 09/09/2016  . Bilateral hip bursitis 03/08/2016  . Counseling regarding end of life decision making 02/03/2015  . Mild dementia 06/14/2012  . OSTEOARTHROSIS UNSPEC WHETHER GEN/LOCALIZED HAND 01/14/2008  . Diabetes mellitus with no complication (HCC) 12/21/2006  . Hyperlipidemia LDL goal <100 12/21/2006  . Generalized anxiety disorder 12/21/2006  . Major depressive disorder, single episode, moderate (HCC) 12/21/2006  . COMMON MIGRAINE 12/21/2006  . Essential hypertension, benign 12/21/2006  . PUD 12/21/2006    Past Medical History:  Diagnosis Date  . Anxiety   . Depression   . Diabetes mellitus   . Hyperlipidemia   . Hypertension   . Kidney stones     Past Surgical History:  Procedure Laterality Date  . ABDOMINAL HYSTERECTOMY    . BREAST BIOPSY    . PARTIAL COLECTOMY  1996   ruptured abcess for diverticulitis  . PARTIAL HYSTERECTOMY     uterine prolapse  . right parotid      Social History   Socioeconomic  History  . Marital status: Married    Spouse name: Not on file  . Number of children: Not on file  . Years of education: Not on file  . Highest education level: Not on file  Occupational History  . Occupation: day care    Employer: RETIRED  Social Needs  . Financial resource strain: Not on file  . Food insecurity:    Worry: Not on file    Inability: Not on file  . Transportation needs:    Medical: Not on file    Non-medical: Not on file  Tobacco Use  . Smoking status: Never Smoker  . Smokeless tobacco: Never Used  Substance and Sexual Activity  . Alcohol use: No    Alcohol/week: 0.0 oz  . Drug use: No  . Sexual activity: Not on file  Lifestyle  . Physical activity:    Days per week: Not on file    Minutes per session: Not on file  . Stress: Not on file  Relationships  . Social connections:    Talks on phone: Not on file    Gets together: Not on file    Attends religious service: Not on file    Active member of club or organization: Not on file    Attends meetings of clubs or organizations: Not on file    Relationship status: Not on file  . Intimate partner violence:    Fear of current or ex partner: Not  on file    Emotionally abused: Not on file    Physically abused: Not on file    Forced sexual activity: Not on file  Other Topics Concern  . Not on file  Social History Narrative   Regular exercise--no, but walking on weekends.      Diet-- healthy, low carb, working on weight loss   No living will. Full Code. (Reviewed 2013)    Family History  Problem Relation Age of Onset  . Alzheimer'Newton disease Mother   . Diabetes Maternal Grandfather   . Coronary artery disease Maternal Grandfather     No Known Allergies  Medication list reviewed and updated in full in [facility].  GEN: No fevers, chills. Nontoxic. Primarily MSK c/o today. MSK: Detailed in the HPI GI: tolerating PO intake without difficulty Neuro: No numbness, parasthesias, or tingling  associated. Otherwise the pertinent positives of the ROS are noted above.   Objective:   BP 120/82   Pulse 60   Temp 98.6 F (37 C) (Oral)   Ht 4' 11.75" (1.518 m)   Wt 161 lb 4 oz (73.1 kg)   BMI 31.76 kg/m    GEN: WDWN, NAD, Non-toxic, Alert & Oriented x 3 HEENT: Atraumatic, Normocephalic.  Ears and Nose: No external deformity. EXTR: No clubbing/cyanosis/edema NEURO: Normal gait.  PSYCH: Normally interactive. Conversant. Not depressed or anxious appearing.  Calm demeanor.    Right elbow is nontender on exam to palpation.  She has full flexion at the elbow at this point, and she lacks about 6 degrees of extension.  There is only minor restriction in supination and pronation.  Radiology: Dg Elbow Complete Right (3+view)  Result Date: 10/05/2017 CLINICAL DATA:  Right radial neck fracture. EXAM: RIGHT ELBOW - COMPLETE 3+ VIEW COMPARISON:  Radiographs of September 21, 2017. FINDINGS: Minimally displaced right radial neck fracture is again noted. There is increased sclerosis along the fracture line suggesting some healing. No new fracture or dislocation is noted. No abnormal fat pad displacement is noted. IMPRESSION: Minimally displaced right radial neck fracture is again noted, with increased sclerosis along fracture line suggesting some degree of healing. Electronically Signed   By: Lupita Raider, M.D.   On: 10/05/2017 13:32    Assessment and Plan:   Closed nondisplaced fracture of neck of right radius with routine healing, subsequent encounter - Plan: DG ELBOW COMPLETE RIGHT (3+VIEW)  She really is doing better than anticipated.  Asymptomatic.  Her range of motion is good at this point.  She has good signs of healing   Radiographically.  Follow-up: Return in about 1 month (around 11/04/2017).  Orders Placed This Encounter  Procedures  . DG ELBOW COMPLETE RIGHT (3+VIEW)    Signed,  Ladan Vanderzanden T. Nilam Quakenbush, MD   Allergies as of 10/05/2017   No Known Allergies     Medication List         Accurate as of 10/05/17  7:33 PM. Always use your most recent med list.          atorvastatin 80 MG tablet Commonly known as:  LIPITOR TAKE 1 TABLET BY MOUTH DAILY.   CENTRUM SILVER PO Take 1 tablet by mouth daily.   donepezil 5 MG tablet Commonly known as:  ARICEPT TAKE 1 TABLET (5 MG TOTAL) BY MOUTH NIGHTLY.   esomeprazole 40 MG capsule Commonly known as:  NEXIUM Take 40 mg by mouth daily at 12 noon.   FLUoxetine 20 MG capsule Commonly known as:  PROZAC Take 3 capsules (  60 mg total) by mouth daily.   lisinopril 5 MG tablet Commonly known as:  PRINIVIL,ZESTRIL TAKE 1 TABLET (5 MG TOTAL) BY MOUTH DAILY.   Melatonin 5 MG Caps   propranolol 40 MG tablet Commonly known as:  INDERAL TAKE 1 TABLET BY MOUTH 2 (TWO) TIMES DAILY.

## 2017-10-10 ENCOUNTER — Other Ambulatory Visit: Payer: Self-pay | Admitting: Family Medicine

## 2017-10-13 ENCOUNTER — Ambulatory Visit: Payer: Medicare Other | Admitting: Family Medicine

## 2017-11-01 ENCOUNTER — Ambulatory Visit (INDEPENDENT_AMBULATORY_CARE_PROVIDER_SITE_OTHER)
Admission: RE | Admit: 2017-11-01 | Discharge: 2017-11-01 | Disposition: A | Discharge status: Home / Self Care | Payer: Medicare Other | Source: Ambulatory Visit | Attending: Family Medicine | Admitting: Family Medicine

## 2017-11-01 ENCOUNTER — Ambulatory Visit (INDEPENDENT_AMBULATORY_CARE_PROVIDER_SITE_OTHER): Payer: Medicare Other | Admitting: Family Medicine

## 2017-11-01 ENCOUNTER — Encounter: Payer: Self-pay | Admitting: Family Medicine

## 2017-11-01 VITALS — BP 120/78 | HR 64 | Temp 98.5°F | Ht 59.75 in | Wt 154.0 lb

## 2017-11-01 DIAGNOSIS — S52134A Nondisplaced fracture of neck of right radius, initial encounter for closed fracture: Secondary | ICD-10-CM | POA: Diagnosis not present

## 2017-11-01 DIAGNOSIS — S52134D Nondisplaced fracture of neck of right radius, subsequent encounter for closed fracture with routine healing: Secondary | ICD-10-CM

## 2017-11-01 NOTE — Progress Notes (Signed)
Dr. Karleen Hampshire T. Deontray Hunnicutt, MD, CAQ Sports Medicine Primary Care and Sports Medicine [address] [place], [address] Phone: [phone] Fax: [phone]  11/01/2017  Patient: Crystal Newton, MRN: [id number], DOB: 03-02-1946, 72 y.o.  Primary Physician:  Excell Seltzer, MD   Chief Complaint  Patient presents with  . Follow-up    right elbow   Subjective:   Crystal Newton is a 72 y.o. very pleasant female patient who presents with the following:  Patient had a traumatic elbow injury at the below day.  At this point she is not having any pain at all, she is 2 months out from her initial injury.  She initially was immobilized for about 2 weeks, and now she is functionally doing well.  She Cherre Huger lacks a modest amount of extension only.  10/05/2017 Last OV with Hannah Beat, MD  F/u R radial neck fracture: DOI 09/07/2017  Elbow motion is very good.  She does not have any pain at all at the elbow.  Her husband is been reminding her and she has been working on her range of motion at least twice a day.   Past Medical History, Surgical History, Social History, Family History, Problem List, Medications, and Allergies have been reviewed and updated if relevant.  Patient Active Problem List   Diagnosis Date Noted  . Chronic insomnia 03/14/2017  . Digital mucinous cyst of finger of right hand 09/09/2016  . Bilateral hip bursitis 03/08/2016  . Counseling regarding end of life decision making 02/03/2015  . Mild dementia 06/14/2012  . OSTEOARTHROSIS UNSPEC WHETHER GEN/LOCALIZED HAND 01/14/2008  . Diabetes mellitus with no complication (HCC) 12/21/2006  . Hyperlipidemia LDL goal <100 12/21/2006  . Generalized anxiety disorder 12/21/2006  . Major depressive disorder, single episode, moderate (HCC) 12/21/2006  . COMMON MIGRAINE 12/21/2006  . Essential hypertension, benign 12/21/2006  . PUD 12/21/2006    Past Medical History:  Diagnosis Date  . Anxiety   . Depression   . Diabetes  mellitus   . Hyperlipidemia   . Hypertension   . Kidney stones     Past Surgical History:  Procedure Laterality Date  . ABDOMINAL HYSTERECTOMY    . BREAST BIOPSY    . PARTIAL COLECTOMY  1996   ruptured abcess for diverticulitis  . PARTIAL HYSTERECTOMY     uterine prolapse  . right parotid      Social History   Socioeconomic History  . Marital status: Married    Spouse name: Not on file  . Number of children: Not on file  . Years of education: Not on file  . Highest education level: Not on file  Occupational History  . Occupation: day care    Employer: RETIRED  Social Needs  . Financial resource strain: Not on file  . Food insecurity:    Worry: Not on file    Inability: Not on file  . Transportation needs:    Medical: Not on file    Non-medical: Not on file  Tobacco Use  . Smoking status: Never Smoker  . Smokeless tobacco: Never Used  Substance and Sexual Activity  . Alcohol use: No    Alcohol/week: 0.0 oz  . Drug use: No  . Sexual activity: Not on file  Lifestyle  . Physical activity:    Days per week: Not on file    Minutes per session: Not on file  . Stress: Not on file  Relationships  . Social connections:    Talks on phone:  Not on file    Gets together: Not on file    Attends religious service: Not on file    Active member of club or organization: Not on file    Attends meetings of clubs or organizations: Not on file    Relationship status: Not on file  . Intimate partner violence:    Fear of current or ex partner: Not on file    Emotionally abused: Not on file    Physically abused: Not on file    Forced sexual activity: Not on file  Other Topics Concern  . Not on file  Social History Narrative   Regular exercise--no, but walking on weekends.      Diet-- healthy, low carb, working on weight loss   No living will. Full Code. (Reviewed 2013)    Family History  Problem Relation Age of Onset  . Alzheimer's disease Mother   . Diabetes Maternal  Grandfather   . Coronary artery disease Maternal Grandfather     No Known Allergies  Medication list reviewed and updated in full in [facility].  GEN: No fevers, chills. Nontoxic. Primarily MSK c/o today. MSK: Detailed in the HPI GI: tolerating PO intake without difficulty Neuro: No numbness, parasthesias, or tingling associated. Otherwise the pertinent positives of the ROS are noted above.   Objective:   BP 120/78   Pulse 64   Temp 98.5 F (36.9 C) (Oral)   Ht 4' 11.75" (1.518 m)   Wt 154 lb (69.9 kg)   BMI 30.33 kg/m    GEN: WDWN, NAD, Non-toxic, Alert & Oriented x 3 HEENT: Atraumatic, Normocephalic.  Ears and Nose: No external deformity. EXTR: No clubbing/cyanosis/edema NEURO: Normal gait.  PSYCH: Normally interactive. Conversant. Not depressed or anxious appearing.  Calm demeanor.   R elbow Ecchymosis or edema: neg ROM: full flexion, extension, pronation, supination - 4 deg loss of extension Shoulder ROM: Full Flexion: 5/5 Extension: 5/5 Supination: 5/5  Pronation: 5/5 Wrist ext: 5/5 Wrist flexion: 5/5 No gross bony abnormality Varus and Valgus stress: stable ECRB tenderness: neg Medial epicondyle: NT Lateral epicondyle, resisted wrist extension from wrist full pronation and flexion: NT grip: 5/5  sensation intact Tinel's, Elbow: negative   Radiology:  Assessment and Plan:   Closed nondisplaced fracture of neck of right radius with routine healing, subsequent encounter - Plan: DG ELBOW COMPLETE RIGHT (3+VIEW)  Doing well with good signs of healing on plain film.  I will see her prn only now.   Expected length of time, 3 months to total healing.   Orders Placed This Encounter  Procedures  . DG ELBOW COMPLETE RIGHT (3+VIEW)    Signed,  Lexton Hidalgo T. Emelly Wurtz, MD   Allergies as of 11/01/2017   No Known Allergies     Medication List        Accurate as of 11/01/17  1:53 PM. Always use your most recent med list.          atorvastatin  80 MG tablet Commonly known as:  LIPITOR TAKE 1 TABLET BY MOUTH DAILY.   CENTRUM SILVER PO Take 1 tablet by mouth daily.   donepezil 5 MG tablet Commonly known as:  ARICEPT TAKE 1 TABLET (5 MG TOTAL) BY MOUTH NIGHTLY.   esomeprazole 40 MG capsule Commonly known as:  NEXIUM Take 40 mg by mouth daily at 12 noon.   FLUoxetine 20 MG capsule Commonly known as:  PROZAC TAKE 3 CAPSULES BY MOUTH EVERY DAY   lisinopril 5 MG tablet Commonly known  as:  PRINIVIL,ZESTRIL TAKE 1 TABLET (5 MG TOTAL) BY MOUTH DAILY.   Melatonin 5 MG Caps   propranolol 40 MG tablet Commonly known as:  INDERAL TAKE 1 TABLET BY MOUTH 2 (TWO) TIMES DAILY.

## 2017-11-09 ENCOUNTER — Other Ambulatory Visit: Payer: Self-pay | Admitting: Family Medicine

## 2017-12-05 ENCOUNTER — Other Ambulatory Visit: Payer: Self-pay | Admitting: Family Medicine

## 2018-02-06 ENCOUNTER — Ambulatory Visit (INDEPENDENT_AMBULATORY_CARE_PROVIDER_SITE_OTHER): Payer: Medicare Other | Admitting: *Deleted

## 2018-02-06 DIAGNOSIS — Z23 Encounter for immunization: Secondary | ICD-10-CM

## 2018-03-20 ENCOUNTER — Other Ambulatory Visit: Payer: Self-pay | Admitting: Family Medicine

## 2018-04-04 ENCOUNTER — Other Ambulatory Visit: Payer: Self-pay | Admitting: Family Medicine

## 2018-04-05 ENCOUNTER — Other Ambulatory Visit: Payer: Self-pay | Admitting: Family Medicine

## 2018-05-04 ENCOUNTER — Other Ambulatory Visit: Payer: Self-pay | Admitting: Family Medicine

## 2018-05-04 NOTE — Telephone Encounter (Signed)
Last office visit 11/01/2017 with Dr. Patsy Lager for closed nondisplaced fracture.  When last seen by PCP on 09/15/2017 AVS states to follow up in one month for mood.  Next Appt: 09/28/2018 for CPE.  Ok to refill?

## 2018-05-31 ENCOUNTER — Other Ambulatory Visit: Payer: Self-pay | Admitting: Family Medicine

## 2018-08-23 ENCOUNTER — Other Ambulatory Visit: Payer: Self-pay | Admitting: Family Medicine

## 2018-09-13 ENCOUNTER — Other Ambulatory Visit: Payer: Self-pay | Admitting: Family Medicine

## 2018-09-26 ENCOUNTER — Other Ambulatory Visit (INDEPENDENT_AMBULATORY_CARE_PROVIDER_SITE_OTHER): Payer: Medicare Other

## 2018-09-26 ENCOUNTER — Ambulatory Visit (INDEPENDENT_AMBULATORY_CARE_PROVIDER_SITE_OTHER): Payer: Medicare Other

## 2018-09-26 DIAGNOSIS — E785 Hyperlipidemia, unspecified: Secondary | ICD-10-CM | POA: Diagnosis not present

## 2018-09-26 DIAGNOSIS — Z0000 Encounter for general adult medical examination without abnormal findings: Secondary | ICD-10-CM

## 2018-09-26 DIAGNOSIS — E119 Type 2 diabetes mellitus without complications: Secondary | ICD-10-CM

## 2018-09-26 DIAGNOSIS — I10 Essential (primary) hypertension: Secondary | ICD-10-CM | POA: Diagnosis not present

## 2018-09-26 LAB — COMPREHENSIVE METABOLIC PANEL
ALT: 18 U/L (ref 0–35)
AST: 18 U/L (ref 0–37)
Albumin: 4.1 g/dL (ref 3.5–5.2)
Alkaline Phosphatase: 91 U/L (ref 39–117)
BUN: 19 mg/dL (ref 6–23)
CO2: 28 mEq/L (ref 19–32)
Calcium: 9.6 mg/dL (ref 8.4–10.5)
Chloride: 104 mEq/L (ref 96–112)
Creatinine, Ser: 0.67 mg/dL (ref 0.40–1.20)
GFR: 86.22 mL/min (ref >60.00)
Glucose, Bld: 107 mg/dL — ABNORMAL HIGH (ref 70–99)
Potassium: 4.7 mEq/L (ref 3.5–5.1)
Sodium: 140 mEq/L (ref 135–145)
Total Bilirubin: 0.5 mg/dL (ref 0.2–1.2)
Total Protein: 6.2 g/dL (ref 6.0–8.3)

## 2018-09-26 LAB — LIPID PANEL
Cholesterol: 172 mg/dL (ref 0–200)
HDL: 58.6 mg/dL (ref >39.00)
LDL Cholesterol: 95 mg/dL (ref 0–99)
NonHDL: 113.47
Total CHOL/HDL Ratio: 3
Triglycerides: 93 mg/dL (ref 0.0–149.0)
VLDL: 18.6 mg/dL (ref 0.0–40.0)

## 2018-09-26 LAB — CBC WITH DIFFERENTIAL/PLATELET
Basophils Absolute: 0 10*3/uL (ref 0.0–0.1)
Basophils Relative: 0.4 % (ref 0.0–3.0)
Eosinophils Absolute: 0.1 10*3/uL (ref 0.0–0.7)
Eosinophils Relative: 1.7 % (ref 0.0–5.0)
HCT: 36.9 % (ref 36.0–46.0)
Hemoglobin: 12.1 g/dL (ref 12.0–15.0)
Lymphocytes Relative: 22.3 % (ref 12.0–46.0)
Lymphs Abs: 1.6 10*3/uL (ref 0.7–4.0)
MCHC: 32.8 g/dL (ref 30.0–36.0)
MCV: 96 fl (ref 78.0–100.0)
Monocytes Absolute: 0.5 10*3/uL (ref 0.1–1.0)
Monocytes Relative: 7.5 % (ref 3.0–12.0)
Neutro Abs: 4.8 10*3/uL (ref 1.4–7.7)
Neutrophils Relative %: 68.1 % (ref 43.0–77.0)
Platelets: 364 10*3/uL (ref 150.0–400.0)
RBC: 3.84 Mil/uL — ABNORMAL LOW (ref 3.87–5.11)
RDW: 13.8 % (ref 11.5–15.5)
WBC: 7 10*3/uL (ref 4.0–10.5)

## 2018-09-26 LAB — HEMOGLOBIN A1C: Hgb A1c MFr Bld: 6 % (ref 4.6–6.5)

## 2018-09-26 NOTE — Progress Notes (Signed)
I reviewed health advisor's note, was available for consultation, and agree with documentation and plan.   Signed,  Yani Coventry T. Carole Doner, MD  

## 2018-09-26 NOTE — Progress Notes (Signed)
PCP notes:   Health maintenance:  Foot exam - PCP follow-up requested Mammogram - postponed Tetanus vaccine - postponed A1C - completed  Abnormal screenings:   Fall risk - hx of single fall Fall Risk  09/26/2018 03/08/2017 03/08/2016 02/03/2015 01/07/2014  Falls in the past year? 1 Yes No Yes Yes  Comment fell at doctor's office; broke glasses - - - -  Number falls in past yr: 0 2 or more - 1 1  Injury with Fall? 1 Yes - No No  Risk Factor Category  - - - - -  Risk for fall due to : - - - - -   Patient concerns:   None  Nurse concerns:  None  Next PCP appt:   09/28/18 @ 1000

## 2018-09-26 NOTE — Progress Notes (Signed)
Subjective:   Crystal Newton is a 73 y.o. female who presents for Medicare Annual (Subsequent) preventive examination.  Review of Systems:  N/A Cardiac Risk Factors include: advanced age (>7men, >70 women);diabetes mellitus;hypertension;dyslipidemia     Objective:     Vitals: There were no vitals taken for this visit.  There is no height or weight on file to calculate BMI.  Advanced Directives 09/26/2018 03/08/2017 03/08/2016  Does Patient Have a Medical Advance Directive? No No Yes;No  Would patient like information on creating a medical advance directive? No - Patient declined Yes (MAU/Ambulatory/Procedural Areas - Information given) Yes (MAU/Ambulatory/Procedural Areas - Information given)    Tobacco Social History   Tobacco Use  Smoking Status Never Smoker  Smokeless Tobacco Never Used     Counseling given: No   Clinical Intake:  Pre-visit preparation completed: Yes  Pain Score: 3      Nutritional Status: BMI 25 -29 Overweight Nutritional Risks: None Diabetes: No  How often do you need to have someone help you when you read instructions, pamphlets, or other written materials from your doctor or pharmacy?: 3 - Sometimes What is the last grade level you completed in school?: 12th grade  Interpreter Needed?: No  Comments: pt lives with spouse Information entered by :: LPinson, LPN  Past Medical History:  Diagnosis Date  . Anxiety   . Depression   . Diabetes mellitus   . Hyperlipidemia   . Hypertension   . Kidney stones    Past Surgical History:  Procedure Laterality Date  . ABDOMINAL HYSTERECTOMY    . BREAST BIOPSY    . PARTIAL COLECTOMY  1996   ruptured abcess for diverticulitis  . PARTIAL HYSTERECTOMY     uterine prolapse  . right parotid     Family History  Problem Relation Age of Onset  . Alzheimer's disease Mother   . Diabetes Maternal Grandfather   . Coronary artery disease Maternal Grandfather    Social History   Socioeconomic  History  . Marital status: Married    Spouse name: Not on file  . Number of children: Not on file  . Years of education: Not on file  . Highest education level: Not on file  Occupational History  . Occupation: day care    Employer: RETIRED  Social Needs  . Financial resource strain: Not on file  . Food insecurity:    Worry: Not on file    Inability: Not on file  . Transportation needs:    Medical: Not on file    Non-medical: Not on file  Tobacco Use  . Smoking status: Never Smoker  . Smokeless tobacco: Never Used  Substance and Sexual Activity  . Alcohol use: No    Alcohol/week: 0.0 standard drinks  . Drug use: No  . Sexual activity: Not Currently  Lifestyle  . Physical activity:    Days per week: Not on file    Minutes per session: Not on file  . Stress: Not on file  Relationships  . Social connections:    Talks on phone: Not on file    Gets together: Not on file    Attends religious service: Not on file    Active member of club or organization: Not on file    Attends meetings of clubs or organizations: Not on file    Relationship status: Not on file  Other Topics Concern  . Not on file  Social History Narrative   Regular exercise--no, but walking on weekends.  Diet-- healthy, low carb, working on weight loss   No living will. Full Code. (Reviewed 2013)    Outpatient Encounter Medications as of 09/26/2018  Medication Sig  . atorvastatin (LIPITOR) 80 MG tablet TAKE 1 TABLET BY MOUTH DAILY.  Marland Kitchen. donepezil (ARICEPT) 5 MG tablet TAKE 1 TABLET BY MOUTH EVERY DAY AT NIGHT  . esomeprazole (NEXIUM) 40 MG capsule Take 40 mg by mouth daily at 12 noon.  Marland Kitchen. FLUoxetine (PROZAC) 20 MG capsule TAKE 3 CAPSULES BY MOUTH EVERY DAY  . lisinopril (PRINIVIL,ZESTRIL) 5 MG tablet TAKE 1 TABLET BY MOUTH EVERY DAY  . Melatonin 5 MG CAPS   . Multiple Vitamins-Minerals (CENTRUM SILVER PO) Take 1 tablet by mouth daily.  . propranolol (INDERAL) 40 MG tablet TAKE 1 TABLET BY MOUTH TWICE A  DAY   No facility-administered encounter medications on file as of 09/26/2018.     Activities of Daily Living In your present state of health, do you have any difficulty performing the following activities: 09/26/2018  Hearing? Y  Vision? Y  Difficulty concentrating or making decisions? Y  Walking or climbing stairs? Y  Dressing or bathing? N  Doing errands, shopping? Y  Preparing Food and eating ? N  Using the Toilet? N  In the past six months, have you accidently leaked urine? Y  Do you have problems with loss of bowel control? N  Managing your Medications? Y  Managing your Finances? Y  Housekeeping or managing your Housekeeping? Y  Some recent data might be hidden    Patient Care Team: Excell SeltzerBedsole, Amy E, MD as PCP - General Dingeldein, Viviann SpareSteven, MD as Referring Physician (Ophthalmology)    Assessment:   This is a routine wellness examination for Crystal Newton.  Vision Screening Comments: Vision exam in 2019 with Dr. Dellie Burnsingeldein  Exercise Activities and Dietary recommendations Current Exercise Habits: The patient does not participate in regular exercise at present, Exercise limited by: None identified  Goals    . Patient Stated     Starting 09/26/18, I will continue to take medications as prescribed.        Fall Risk Fall Risk  09/26/2018 03/08/2017 03/08/2016 02/03/2015 01/07/2014  Falls in the past year? 1 Yes No Yes Yes  Comment fell at doctor's office; broke glasses - - - -  Number falls in past yr: 0 2 or more - 1 1  Injury with Fall? 1 Yes - No No  Risk Factor Category  - - - - -  Risk for fall due to : - - - - -   Depression Screen PHQ 2/9 Scores 09/26/2018 09/15/2017 03/08/2017 03/08/2016  PHQ - 2 Score 0 4 2 0  PHQ- 9 Score 0 16 14 -     Cognitive Function MMSE - Mini Mental State Exam 09/26/2018 03/08/2017  Not completed: (No Data) -  Orientation to time - 5  Orientation to Place - 5  Registration - 3  Attention/ Calculation - 0  Recall - 2  Recall-comments -  unable to recall 1 of 3 words  Language- name 2 objects - 0  Language- repeat - 1  Language- follow 3 step command - 3  Language- read & follow direction - 0  Write a sentence - 0  Copy design - 0  Total score - 19    DX: dementia    Immunization History  Administered Date(s) Administered  . Influenza Split 01/13/2011, 02/23/2012  . Influenza Whole 03/02/2007, 01/14/2008, 02/26/2009, 01/21/2010  . Influenza,inj,Quad PF,6+ Mos 12/18/2012, 01/07/2014,  02/03/2015, 01/15/2016, 01/13/2017, 02/06/2018  . Pneumococcal Conjugate-13 01/07/2014  . Pneumococcal Polysaccharide-23 03/14/2005, 10/11/2010  . Td 03/14/2005    Screening Tests Health Maintenance  Topic Date Due  . FOOT EXAM  04/18/2019 (Originally 03/14/2018)  . MAMMOGRAM  04/18/2019 (Originally 03/15/2018)  . TETANUS/TDAP  04/17/2020 (Originally 03/15/2015)  . INFLUENZA VACCINE  11/17/2018  . OPHTHALMOLOGY EXAM  03/19/2019  . HEMOGLOBIN A1C  03/28/2019  . Fecal DNA (Cologuard)  05/08/2020  . DEXA SCAN  Completed  . Hepatitis C Screening  Completed  . PNA vac Low Risk Adult  Completed      Plan:     I have personally reviewed, addressed, and noted the following in the patient's chart:  A. Medical and social history B. Use of alcohol, tobacco or illicit drugs  C. Current medications and supplements D. Functional ability and status E.  Nutritional status F.  Physical activity G. Advance directives H. List of other physicians I.  Hospitalizations, surgeries, and ER visits in previous 12 months J.  Vitals (unless it is a telemedicine encounter) K. Screenings to include cognitive, depression, hearing, vision (NOTE: hearing and vision screenings not completed in telemedicine encounter) L. Referrals and appointments   In addition, I have reviewed and discussed with patient certain preventive protocols, quality metrics, and best practice recommendations. A written personalized care plan for preventive services and  recommendations were provided to patient.  With patient's permission, we connected on 09/26/18 at 10:00 AM EDT. Interactive audio and video telecommunications were attempted with patient. This attempt was unsuccessful due to patient having technical difficulties OR patient did not have access to video capability.  Encounter was completed with audio only.  Two patient identifiers were used to ensure the encounter occurred with the correct person. Patient was in home and writer was in office.   Signed,   Randa EvensLesia Pinson, MHA, BS, RN Health Coach

## 2018-09-26 NOTE — Patient Instructions (Signed)
Crystal Newton , Thank you for taking time to come for your Medicare Wellness Visit. I appreciate your ongoing commitment to your health goals. Please review the following plan we discussed and let me know if I can assist you in the future.   These are the goals we discussed: Goals    . Patient Stated     Starting 09/26/18, I will continue to take medications as prescribed.        This is a list of the screening recommended for you and due dates:  Health Maintenance  Topic Date Due  . Complete foot exam   04/18/2019*  . Mammogram  04/18/2019*  . Tetanus Vaccine  04/17/2020*  . Flu Shot  11/17/2018  . Eye exam for diabetics  03/19/2019  . Hemoglobin A1C  03/28/2019  . Cologuard (Stool DNA test)  05/08/2020  . DEXA scan (bone density measurement)  Completed  .  Hepatitis C: One time screening is recommended by Center for Disease Control  (CDC) for  adults born from [birth year] through 1965.   Completed  . Pneumonia vaccines  Completed  *Topic was postponed. The date shown is not the original due date.   Preventive Care for Adults  A healthy lifestyle and preventive care can promote health and wellness. Preventive health guidelines for adults include the following key practices.  . A routine yearly physical is a good way to check with your health care provider about your health and preventive screening. It is a chance to share any concerns and updates on your health and to receive a thorough exam.  . Visit your dentist for a routine exam and preventive care every 6 months. Brush your teeth twice a day and floss once a day. Good oral hygiene prevents tooth decay and gum disease.  . The frequency of eye exams is based on your age, health, family medical history, use  of contact lenses, and other factors. Follow your health care provider's recommendations for frequency of eye exams.  . Eat a healthy diet. Foods like vegetables, fruits, whole grains, low-fat dairy products, and lean protein foods  contain the nutrients you need without too many calories. Decrease your intake of foods high in solid fats, added sugars, and salt. Eat the right amount of calories for you. Get information about a proper diet from your health care provider, if necessary.  . Regular physical exercise is one of the most important things you can do for your health. Most adults should get at least 150 minutes of moderate-intensity exercise (any activity that increases your heart rate and causes you to sweat) each week. In addition, most adults need muscle-strengthening exercises on 2 or more days a week.  Silver Sneakers may be a benefit available to you. To determine eligibility, you may visit the website: www.silversneakers.com or contact program at (772)209-7152 Mon-Fri between 8AM-8PM.   . Maintain a healthy weight. The body mass index (BMI) is a screening tool to identify possible weight problems. It provides an estimate of body fat based on height and weight. Your health care provider can find your BMI and can help you achieve or maintain a healthy weight.   For adults 20 years and older: ? A BMI below 18.5 is considered underweight. ? A BMI of 18.5 to 24.9 is normal. ? A BMI of 25 to 29.9 is considered overweight. ? A BMI of 30 and above is considered obese.   . Maintain normal blood lipids and cholesterol levels by exercising and minimizing your  intake of saturated fat. Eat a balanced diet with plenty of fruit and vegetables. Blood tests for lipids and cholesterol should begin at age 16 and be repeated every 5 years. If your lipid or cholesterol levels are high, you are over 50, or you are at high risk for heart disease, you may need your cholesterol levels checked more frequently. Ongoing high lipid and cholesterol levels should be treated with medicines if diet and exercise are not working.  . If you smoke, find out from your health care provider how to quit. If you do not use tobacco, please do not  start.  . If you choose to drink alcohol, please do not consume more than 2 drinks per day. One drink is considered to be 12 ounces (355 mL) of beer, 5 ounces (148 mL) of wine, or 1.5 ounces (44 mL) of liquor.  . If you are 68-49 years old, ask your health care provider if you should take aspirin to prevent strokes.  . Use sunscreen. Apply sunscreen liberally and repeatedly throughout the day. You should seek shade when your shadow is shorter than you. Protect yourself by wearing long sleeves, pants, a wide-brimmed hat, and sunglasses year round, whenever you are outdoors.  . Once a month, do a whole body skin exam, using a mirror to look at the skin on your back. Tell your health care provider of new moles, moles that have irregular borders, moles that are larger than a pencil eraser, or moles that have changed in shape or color.

## 2018-09-27 ENCOUNTER — Other Ambulatory Visit: Payer: Self-pay | Admitting: Family Medicine

## 2018-09-28 ENCOUNTER — Ambulatory Visit (INDEPENDENT_AMBULATORY_CARE_PROVIDER_SITE_OTHER): Payer: Medicare Other | Admitting: Family Medicine

## 2018-09-28 ENCOUNTER — Encounter: Payer: Self-pay | Admitting: Family Medicine

## 2018-09-28 VITALS — Wt 136.4 lb

## 2018-09-28 DIAGNOSIS — E119 Type 2 diabetes mellitus without complications: Secondary | ICD-10-CM

## 2018-09-28 DIAGNOSIS — I10 Essential (primary) hypertension: Secondary | ICD-10-CM

## 2018-09-28 DIAGNOSIS — F321 Major depressive disorder, single episode, moderate: Secondary | ICD-10-CM

## 2018-09-28 DIAGNOSIS — E785 Hyperlipidemia, unspecified: Secondary | ICD-10-CM

## 2018-09-28 DIAGNOSIS — F03A0 Unspecified dementia, mild, without behavioral disturbance, psychotic disturbance, mood disturbance, and anxiety: Secondary | ICD-10-CM

## 2018-09-28 DIAGNOSIS — R634 Abnormal weight loss: Secondary | ICD-10-CM

## 2018-09-28 DIAGNOSIS — Z0000 Encounter for general adult medical examination without abnormal findings: Secondary | ICD-10-CM | POA: Diagnosis not present

## 2018-09-28 DIAGNOSIS — F0390 Unspecified dementia without behavioral disturbance: Secondary | ICD-10-CM

## 2018-09-28 NOTE — Assessment & Plan Note (Signed)
Skipping breakfast.  No SE to aricept.  Add glucerna to meals as supplement or to at least have something for breakfast.

## 2018-09-28 NOTE — Assessment & Plan Note (Signed)
Poor control.. pt will come into office for PHQ9 and to discuss changing prozac to different medication.  May be contributing to memory issues,.

## 2018-09-28 NOTE — Progress Notes (Signed)
VIRTUAL VISIT Due to national recommendations of social distancing due to COVID 19, a virtual visit is felt to be most appropriate for this patient at this time.   I connected with the patient on 09/28/18 at 10:00 AM EDT by virtual telehealth platform and verified that I am speaking with the correct person using two identifiers.   I discussed the limitations, risks, security and privacy concerns of performing an evaluation and management service by  virtual telehealth platform and the availability of in person appointments. I also discussed with the patient that there may be a patient responsible charge related to this service. The patient expressed understanding and agreed to proceed.  Patient location: Home Provider Location[facility] [place] Participants: Kerby NoraAmy Bedsole and Maximiano CossJoyce S Schlottman   Chief Complaint  Patient presents with  . Medicare Wellness    History of Present Illness: The patient presents for complete physical and review of chronic health problems.  Acute issues: worsening memory.Marland Kitchen. gradually worsening over last 6 months.  The patient saw Lu Duffelarlesia Pinson, LPN for medicare wellness. Note reviewed in detail and important notes copied below.  Health maintenance:  Foot exam - PCP follow-up requested Mammogram - postponed Tetanus vaccine - postponed A1C - completed  Abnormal screenings:    Fall Risk  09/26/2018 03/08/2017 03/08/2016 02/03/2015 01/07/2014  Falls in the past year? 1 Yes No Yes Yes  Comment fell at doctor's office; broke glasses - - - -  Number falls in past yr: 0 2 or more - 1 1  Injury with Fall? 1 Yes - No No  Risk Factor Category  - - - - -  Risk for fall due to : - - - - -    [facility] 09/26/2018 09/15/2017 03/08/2017  Decreased Interest 0 3 1  Down, Depressed, Hopeless 0 1 1  PHQ - 2 Score 0 4 2  Altered sleeping 0 2 2  Tired, decreased energy 0 3 2  Change in appetite 0 3 2  Feeling bad or failure about yourself  0 2 2   Trouble concentrating 0 2 2  Moving slowly or fidgety/restless 0 0 2  Suicidal thoughts 0 0 0  PHQ-9 Score 0 16 14  Difficult doing work/chores Not difficult at all Somewhat difficult Somewhat difficult    Unable to hearing and vision screen.. but grossly normal per pt and on call.  09/28/18    Husband provides additional  Diabetes:  Well controlled Lab Results  Component Value Date   HGBA1C 6.0 09/26/2018  Using medications without difficulties: Hypoglycemic episodes: Hyperglycemic episodes: Feet problems: no uclers Blood Sugars averaging: eye exam within last year: 03/2018  Elevated Cholesterol:  At goal on high intensity statin Lab Results  Component Value Date   CHOL 172 09/26/2018   HDL 58.60 09/26/2018   LDLCALC 95 09/26/2018   LDLDIRECT 214.9 12/14/2012   TRIG 93.0 09/26/2018   CHOLHDL 3 09/26/2018  Using medications without problems:none Muscle aches: none Diet compliance: moderate Exercise:none Other complaints:  Hypertension:  Good control on on  ACEI and inderal BP Readings from Last 3 Encounters:  11/01/17 120/78  10/05/17 120/82  09/21/17 110/74  Using medication without problems or lightheadedness: none Chest pain with exertion:none Edema:none Short of breath:none Average home BPs: Other issues:  She has been losing weight..  May be related to mood worsening. Wt Readings from Last 3 Encounters:  09/28/18 136 lb 6 oz (61.9 kg)  11/01/17 154 lb (69.9 kg)  10/05/17 161 lb  4 oz (73.1 kg)    Dementia: short term memory,  Worsening control on aricept  MDD, moderate/GAD:  Nml PHQ9 but pt states today mood worse on current dose of fluoxetine.   COVID 19 screen No recent travel or known exposure to COVID19 The patient denies respiratory symptoms of COVID 19 at this time.  The importance of social distancing was discussed today.   Review of Systems  Constitutional: Negative for chills and fever.  HENT: Negative for congestion and ear pain.    Eyes: Negative for pain and redness.  Respiratory: Negative for cough and shortness of breath.   Cardiovascular: Negative for chest pain, palpitations and leg swelling.  Gastrointestinal: Negative for abdominal pain, blood in stool, constipation, diarrhea, nausea and vomiting.  Genitourinary: Negative for dysuria.  Musculoskeletal: Negative for falls and myalgias.  Skin: Negative for rash.  Neurological: Negative for dizziness.  Psychiatric/Behavioral: Negative for depression. The patient is not nervous/anxious.       Past Medical History:  Diagnosis Date  . Anxiety   . Depression   . Diabetes mellitus   . Hyperlipidemia   . Hypertension   . Kidney stones     reports that she has never smoked. She has never used smokeless tobacco. She reports that she does not drink alcohol or use drugs.   Current Outpatient Medications:  .  atorvastatin (LIPITOR) 80 MG tablet, TAKE 1 TABLET BY MOUTH DAILY., Disp: 90 tablet, Rfl: 0 .  donepezil (ARICEPT) 5 MG tablet, TAKE 1 TABLET BY MOUTH EVERY DAY EVERY NIGHT, Disp: 90 tablet, Rfl: 1 .  esomeprazole (NEXIUM) 40 MG capsule, Take 40 mg by mouth daily at 12 noon., Disp: , Rfl:  .  FLUoxetine (PROZAC) 20 MG capsule, TAKE 3 CAPSULES BY MOUTH EVERY DAY, Disp: 270 capsule, Rfl: 1 .  lisinopril (PRINIVIL,ZESTRIL) 5 MG tablet, TAKE 1 TABLET BY MOUTH EVERY DAY, Disp: 90 tablet, Rfl: 1 .  Melatonin 5 MG CAPS, , Disp: , Rfl:  .  Multiple Vitamins-Minerals (CENTRUM SILVER PO), Take 1 tablet by mouth daily., Disp: , Rfl:  .  propranolol (INDERAL) 40 MG tablet, TAKE 1 TABLET BY MOUTH TWICE A DAY, Disp: 180 tablet, Rfl: 0   Observations/Objective: Weight 136 lb 6 oz (61.9 kg).  Physical Exam  Physical Exam Constitutional:      General: The patient is not in acute distress. Pulmonary:     Effort: Pulmonary effort is normal. No respiratory distress.  Neurological:     Mental Status: The patient is alert and oriented to person, place, and time.   Psychiatric:        Mood and Affect: Mood normal.        Behavior: Behavior normal.   Assessment and Plan The patient's preventative maintenance and recommended screening tests for an annual wellness exam were reviewed in full today. Brought up to date unless services declined.  Counselled on the importance of diet, exercise, and its role in overall health and mortality. The patient's FH and SH was reviewed, including their home life, tobacco status, and drug and alcohol status.   Vaccines: Uptodate with  Flu,prevnar and pneumovax, shingles, tdvaccine not covered by insurance.  Colon: Last colon[place] 2002 [place] Was due in 2012. Plan cologuard 2022 DVE/pap: partial hysterectomy, has both ovaries. Asymptomatic, no family history. Nml DVE 2012, not interested in further DVE unless symptomatic.  Mammo:  nml 2017, repeatevery 2 years DEXA:No family history. 2017 overall hip osteopenia, neck osteoporosis.Marland Kitchen.  plan 2021 HEP  C: done     I discussed the assessment and treatment plan with the patient. The patient was provided an opportunity to ask questions and all were answered. The patient agreed with the plan and demonstrated an understanding of the instructions.   The patient was advised to call back or seek an in-person evaluation if the symptoms worsen or if the condition fails to improve as anticipated.   Major depressive disorder, single episode, moderate (HCC) Poor control.. pt will come into office for PHQ9 and to discuss changing prozac to different medication.  May be contributing to memory issues,.  Mild dementia Progressing more rapidly in last 6 months to year.  Will come in to office to assess MMSE, consider MRI and consider increasing Aricept.  Essential hypertension, benign Well controlled. Continue current medication.   Diabetes mellitus with no complication Good control with diet.  Weight loss, unintentional Skipping  breakfast.  No SE to aricept.  Add glucerna to meals as supplement or to at least have something for breakfast. \   Eliezer Lofts, MD

## 2018-09-28 NOTE — Assessment & Plan Note (Signed)
Good control with diet. 

## 2018-09-28 NOTE — Patient Instructions (Addendum)
Start meal supplement ( Glucerna) in addition to meals and try also in AMs.  Get back to exercise as able.  Increasing water intake to help cramps.  Set up mammogram when are able.

## 2018-09-28 NOTE — Assessment & Plan Note (Signed)
Well controlled. Continue current medication.  

## 2018-09-28 NOTE — Assessment & Plan Note (Signed)
Progressing more rapidly in last 6 months to year.  Will come in to office to assess MMSE, consider MRI and consider increasing Aricept.

## 2018-10-05 NOTE — Progress Notes (Signed)
Appointment 6/30

## 2018-10-16 ENCOUNTER — Encounter: Payer: Self-pay | Admitting: Family Medicine

## 2018-10-16 ENCOUNTER — Other Ambulatory Visit: Payer: Self-pay

## 2018-10-16 ENCOUNTER — Ambulatory Visit (INDEPENDENT_AMBULATORY_CARE_PROVIDER_SITE_OTHER): Payer: Medicare Other | Admitting: Family Medicine

## 2018-10-16 VITALS — BP 108/70 | HR 62 | Temp 97.9°F | Ht 59.75 in | Wt 135.5 lb

## 2018-10-16 DIAGNOSIS — R634 Abnormal weight loss: Secondary | ICD-10-CM | POA: Diagnosis not present

## 2018-10-16 DIAGNOSIS — F0390 Unspecified dementia without behavioral disturbance: Secondary | ICD-10-CM | POA: Diagnosis not present

## 2018-10-16 DIAGNOSIS — F5104 Psychophysiologic insomnia: Secondary | ICD-10-CM

## 2018-10-16 DIAGNOSIS — F321 Major depressive disorder, single episode, moderate: Secondary | ICD-10-CM | POA: Diagnosis not present

## 2018-10-16 DIAGNOSIS — F03B0 Unspecified dementia, moderate, without behavioral disturbance, psychotic disturbance, mood disturbance, and anxiety: Secondary | ICD-10-CM

## 2018-10-16 MED ORDER — BUPROPION HCL ER (XL) 150 MG PO TB24: 150.0000 mg | ORAL_TABLET | Freq: Every day | ORAL | 5 refills | Status: DC

## 2018-10-16 MED ORDER — DONEPEZIL HCL 10 MG PO TABS: ORAL_TABLET | ORAL | 11 refills | Status: DC

## 2018-10-16 NOTE — Progress Notes (Signed)
Chief Complaint  Patient presents with  . Follow-up    2 week    History of Present Illness: HPI    73 year old female presents for 2 week follow up dementia and  MDD.  MDD: She reports she has been feeling more depressed in last 6 onth.. gradually worse in last 2 weeks. She is very tearful, irritable, very sensitive. She is not very active, lays around a lot. She sleeps off and on melatonin helps some.  She is on fluoxetine  60 mg daily since 2019 (no improvement)   Mild dementia Progressing more rapidly in last 6 months to year. Mother with history of Alzheimer's. Saw neuro in past. Never had MRI She was skipping breakfast.. slight continued weight loss.. now drinking glucerna. No SE to Aricept. Wt Readings from Last 3 Encounters:  10/16/18 135 lb 8 oz (61.5 kg)  09/28/18 136 lb 6 oz (61.9 kg)  11/01/17 154 lb (69.9 kg)   MMSE in office today: 25-26/30  Trouble with orientation to time, short term memory  2014  CT head: no lesions, age appropriate atrophy.   She has had more headaches in last few weeks... she feels it may be her eye since needs to have cataract surgery and vision is poor without uptodate glasses.  no new neuro changes, no nausea. No red flags indicating need for MRI Hx of migraine COVID 19 screen No recent travel or known exposure to COVID19 The patient denies respiratory symptoms of COVID 19 at this time.  The importance of social distancing was discussed today.   Review of Systems  Constitutional: Positive for malaise/fatigue and weight loss. Negative for chills and fever.  HENT: Negative for congestion and ear pain.   Eyes: Negative for pain and redness.  Respiratory: Negative for cough and shortness of breath.   Cardiovascular: Negative for chest pain, palpitations and leg swelling.  Gastrointestinal: Negative for abdominal pain, blood in stool, constipation, diarrhea, nausea and vomiting.  Genitourinary: Negative for dysuria.   Musculoskeletal: Negative for falls and myalgias.  Skin: Negative for rash.  Neurological: Positive for headaches. Negative for dizziness, tingling, tremors, focal weakness, seizures, loss of consciousness and weakness.  Psychiatric/Behavioral: Positive for depression and memory loss. Negative for substance abuse and suicidal ideas. The patient has insomnia. The patient is not nervous/anxious.       Past Medical History:  Diagnosis Date  . Anxiety   . Depression   . Diabetes mellitus   . Hyperlipidemia   . Hypertension   . Kidney stones     reports that she has never smoked. She has never used smokeless tobacco. She reports that she does not drink alcohol or use drugs.   Current Outpatient Medications:  .  atorvastatin (LIPITOR) 80 MG tablet, TAKE 1 TABLET BY MOUTH DAILY., Disp: 90 tablet, Rfl: 0 .  donepezil (ARICEPT) 5 MG tablet, TAKE 1 TABLET BY MOUTH EVERY DAY EVERY NIGHT, Disp: 90 tablet, Rfl: 1 .  esomeprazole (NEXIUM) 40 MG capsule, Take 40 mg by mouth daily at 12 noon., Disp: , Rfl:  .  FLUoxetine (PROZAC) 20 MG capsule, TAKE 3 CAPSULES BY MOUTH EVERY DAY, Disp: 270 capsule, Rfl: 1 .  lisinopril (PRINIVIL,ZESTRIL) 5 MG tablet, TAKE 1 TABLET BY MOUTH EVERY DAY, Disp: 90 tablet, Rfl: 1 .  Melatonin 5 MG CAPS, , Disp: , Rfl:  .  Multiple Vitamins-Minerals (CENTRUM SILVER PO), Take 1 tablet by mouth daily., Disp: , Rfl:  .  propranolol (INDERAL) 40 MG tablet, TAKE  1 TABLET BY MOUTH TWICE A DAY, Disp: 180 tablet, Rfl: 0   Observations/Objective: Blood pressure 108/70, pulse 62, temperature 97.9 F (36.6 C), temperature source Temporal, height 4' 11.75" (1.518 m), weight 135 lb 8 oz (61.5 kg), SpO2 96 %.  Physical Exam Constitutional:      General: She is not in acute distress.    Appearance: Normal appearance. She is well-developed. She is not ill-appearing or toxic-appearing.  HENT:     Head: Normocephalic.     Right Ear: Hearing, tympanic membrane, ear canal and external  ear normal. Tympanic membrane is not erythematous, retracted or bulging.     Left Ear: Hearing, tympanic membrane, ear canal and external ear normal. Tympanic membrane is not erythematous, retracted or bulging.     Nose: No mucosal edema or rhinorrhea.     Right Sinus: No maxillary sinus tenderness or frontal sinus tenderness.     Left Sinus: No maxillary sinus tenderness or frontal sinus tenderness.     Mouth/Throat:     Pharynx: Uvula midline.  Eyes:     General: Lids are normal. Lids are everted, no foreign bodies appreciated.     Conjunctiva/sclera: Conjunctivae normal.     Pupils: Pupils are equal, round, and reactive to light.  Neck:     Musculoskeletal: Normal range of motion and neck supple.     Thyroid: No thyroid mass or thyromegaly.     Vascular: No carotid bruit.     Trachea: Trachea normal.  Cardiovascular:     Rate and Rhythm: Normal rate and regular rhythm.     Pulses: Normal pulses.     Heart sounds: Normal heart sounds, S1 normal and S2 normal. No murmur. No friction rub. No gallop.   Pulmonary:     Effort: Pulmonary effort is normal. No tachypnea or respiratory distress.     Breath sounds: Normal breath sounds. No decreased breath sounds, wheezing, rhonchi or rales.  Abdominal:     General: Bowel sounds are normal.     Palpations: Abdomen is soft.     Tenderness: There is no abdominal tenderness.  Skin:    General: Skin is warm and dry.     Findings: No rash.  Neurological:     Mental Status: She is alert. She is disoriented.     GCS: GCS eye subscore is 4. GCS verbal subscore is 5. GCS motor subscore is 6.     Cranial Nerves: No cranial nerve deficit.     Sensory: No sensory deficit.     Motor: No abnormal muscle tone.     Coordination: Coordination normal.     Gait: Gait normal.     Deep Tendon Reflexes: Reflexes are normal and symmetric.     Comments: Nml cerebellar exam   No papilledema  Psychiatric:        Mood and Affect: Mood is depressed. Mood is  not anxious. Affect is blunt and flat.        Speech: Speech is delayed.        Behavior: Behavior is slowed and withdrawn. Behavior is cooperative.        Thought Content: Thought content normal. Thought content does not include homicidal or suicidal plan.        Cognition and Memory: Memory is impaired. She exhibits impaired recent memory. She does not exhibit impaired remote memory.        Judgment: Judgment normal.      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  Further Decline in MMSE. Will start by changing MDD treatment then increase aricept to 10 mg daily.  No red flags or clear indication for MRI brain and given Covid19 pt and husband prefer to hold off on MRI at this time.  Major depressive disorder, single episode, moderate (HCC) Stop prozac via wean and change to wellbutrin daily.  Chronic insomnia Moderate control with melatonin.  Weight loss, unintentional  Stable with addition of glucerna and trying to eat breakfast.   Eliezer Lofts, MD

## 2018-10-16 NOTE — Assessment & Plan Note (Signed)
Stable with addition of glucerna and trying to eat breakfast.

## 2018-10-16 NOTE — Assessment & Plan Note (Signed)
Stop prozac via wean and change to wellbutrin daily.

## 2018-10-16 NOTE — Assessment & Plan Note (Addendum)
Likely Alzheimer's dementia.  Further Decline in MMSE. Will start by changing MDD treatment then increase aricept to 10 mg daily.  No red flags or clear indication for MRI brain and given Covid19 pt and husband prefer to hold off on MRI at this time.

## 2018-10-16 NOTE — Assessment & Plan Note (Signed)
Moderate control with melatonin.

## 2018-10-16 NOTE — Patient Instructions (Addendum)
Decrease fluoxetine to 2 tabs of 20 mg daily x 1 week then decrease to 1 tabs daily x 1 week then stop.  Start now wellbutrin daily in AM 150 mg.  When stable .Marland Kitchen Increase ariceprt to 10 mg daily.  Have cataract surgery as as  You can.

## 2018-10-23 DIAGNOSIS — H2513 Age-related nuclear cataract, bilateral: Secondary | ICD-10-CM | POA: Diagnosis not present

## 2018-10-23 LAB — HM DIABETES EYE EXAM

## 2018-10-26 ENCOUNTER — Encounter: Payer: Self-pay | Admitting: Family Medicine

## 2018-10-28 ENCOUNTER — Other Ambulatory Visit: Payer: Self-pay | Admitting: Family Medicine

## 2018-11-07 ENCOUNTER — Other Ambulatory Visit: Payer: Self-pay | Admitting: Family Medicine

## 2018-11-13 ENCOUNTER — Telehealth: Payer: Self-pay | Admitting: Family Medicine

## 2018-11-13 NOTE — Telephone Encounter (Signed)
Patient's husband called.  Patient was changed to Crystal Newton.  Patient's husband said it's increased the depression and it's making patient paranoid.  She's accusing her husband of saying things he didn't say.  Patient is very negative. Patient's husband said her depression wasn't as bad on the other medication.  Patient uses [facility].  Patient has appointment with Dr.Bedsole on Friday.

## 2018-11-13 NOTE — Telephone Encounter (Signed)
Okay to D/C wellbutrin and restart prozac 40 mg daily. Send in refill if needed.

## 2018-11-13 NOTE — Telephone Encounter (Signed)
Left message for Mr. Danis to return my call.

## 2018-11-13 NOTE — Telephone Encounter (Signed)
Mr. Magner notified as instructed by telephone.  He states Mrs. Forrer was taking 20 mg tablets doing 2 tablets in the morning and 1 tablet in the evening.  Spoke with Dr. Diona Browner and she advised to have patient do that instead.  Mr. Jacque states they do not need refills.  Medication list updated.

## 2018-11-16 ENCOUNTER — Ambulatory Visit (INDEPENDENT_AMBULATORY_CARE_PROVIDER_SITE_OTHER): Payer: Medicare Other | Admitting: Family Medicine

## 2018-11-16 ENCOUNTER — Other Ambulatory Visit: Payer: Self-pay

## 2018-11-16 ENCOUNTER — Other Ambulatory Visit: Payer: Self-pay | Admitting: Family Medicine

## 2018-11-16 ENCOUNTER — Encounter: Payer: Self-pay | Admitting: Family Medicine

## 2018-11-16 DIAGNOSIS — F03B0 Unspecified dementia, moderate, without behavioral disturbance, psychotic disturbance, mood disturbance, and anxiety: Secondary | ICD-10-CM

## 2018-11-16 DIAGNOSIS — F0390 Unspecified dementia without behavioral disturbance: Secondary | ICD-10-CM

## 2018-11-16 DIAGNOSIS — F321 Major depressive disorder, single episode, moderate: Secondary | ICD-10-CM

## 2018-11-16 MED ORDER — DONEPEZIL HCL 10 MG PO TABS: ORAL_TABLET | ORAL | 3 refills | Status: DC

## 2018-11-16 MED ORDER — TRAZODONE HCL 50 MG PO TABS: 25.0000 mg | ORAL_TABLET | Freq: Every evening | ORAL | 3 refills | Status: DC | PRN

## 2018-11-16 NOTE — Progress Notes (Signed)
Chief Complaint  Patient presents with  . Follow-up    Mood & Memory    History of Present Illness: HPI  [age] year old female presents with depression as well as dementia.   At last OV we tried changing prozac to  wellbutrin  to see if any benefit in mood.   She did much worse on wellbutrin with more depression, insomnia, paranoia, mood She has now started back on prozac 60 mg daily. She has been back to her previous mood status.   PHQ9 16 today. [facility] 09/26/2018 09/15/2017 03/08/2017  Decreased Interest 0 3 1  Down, Depressed, Hopeless 0 1 1  PHQ - 2 Score 0 4 2  Altered sleeping 0 2 2  Tired, decreased energy 0 3 2  Change in appetite 0 3 2  Feeling bad or failure about yourself  0 2 2  Trouble concentrating 0 2 2  Moving slowly or fidgety/restless 0 0 2  Suicidal thoughts 0 0 0  PHQ-9 Score 0 16 14  Difficult doing work/chores Not difficult at all Somewhat difficult Somewhat difficult   At last OV Aricept was also increased to 10 mg daily.  NO SE to this medication. Good appetite.  Wt Readings from Last 3 Encounters:  11/16/18 132 lb 8 oz (60.1 kg)  10/16/18 135 lb 8 oz (61.5 kg)  09/28/18 136 lb 6 oz (61.9 kg)      COVID 19 screen No recent travel or known exposure to COVID19 The patient denies respiratory symptoms of COVID 19 at this time.  The importance of social distancing was discussed today.   Review of Systems  Constitutional: Negative for chills and fever.  HENT: Negative for congestion and ear pain.   Eyes: Negative for pain and redness.  Respiratory: Negative for cough and shortness of breath.   Cardiovascular: Negative for chest pain, palpitations and leg swelling.  Gastrointestinal: Negative for abdominal pain, blood in stool, constipation, diarrhea, nausea and vomiting.  Genitourinary: Negative for dysuria.  Musculoskeletal: Negative for falls and myalgias.  Skin: Negative for rash.  Neurological: Negative for dizziness.   Psychiatric/Behavioral: Negative for depression. The patient is not nervous/anxious.       Past Medical History:  Diagnosis Date  . Anxiety   . Depression   . Diabetes mellitus   . Hyperlipidemia   . Hypertension   . Kidney stones     reports that she has never smoked. She has never used smokeless tobacco. She reports that she does not drink alcohol or use drugs.   Current Outpatient Medications:  .  atorvastatin (LIPITOR) 80 MG tablet, TAKE 1 TABLET BY MOUTH DAILY., Disp: 90 tablet, Rfl: 0 .  donepezil (ARICEPT) 10 MG tablet, TAKE 1 TABLET BY MOUTH EVERY DAY EVERY NIGHT, Disp: 30 tablet, Rfl: 11 .  esomeprazole (NEXIUM) 40 MG capsule, Take 40 mg by mouth daily at 12 noon., Disp: , Rfl:  .  FLUoxetine (PROZAC) 20 MG tablet, Take 60 mg by mouth daily., Disp: , Rfl:  .  lisinopril (ZESTRIL) 5 MG tablet, TAKE 1 TABLET BY MOUTH EVERY DAY, Disp: 90 tablet, Rfl: 2 .  Melatonin 5 MG CAPS, , Disp: , Rfl:  .  Multiple Vitamins-Minerals (CENTRUM SILVER PO), Take 1 tablet by mouth daily., Disp: , Rfl:  .  propranolol (INDERAL) 40 MG tablet, TAKE 1 TABLET BY MOUTH TWICE A DAY, Disp: 180 tablet, Rfl: 1   Observations/Objective: Blood pressure 90/64, pulse (!) 58, temperature 98 F (36.7 C),  temperature source Temporal, height 4' 11.75" (1.518 m), weight 132 lb 8 oz (60.1 kg), SpO2 93 %.   BP Readings from Last 3 Encounters:  11/16/18 90/64  10/16/18 108/70  11/01/17 120/78  Body mass index is 26.09 kg/m.   Physical Exam Constitutional:      General: She is not in acute distress.    Appearance: Normal appearance. She is well-developed. She is not ill-appearing or toxic-appearing.  HENT:     Head: Normocephalic.     Right Ear: Hearing, tympanic membrane, ear canal and external ear normal. Tympanic membrane is not erythematous, retracted or bulging.     Left Ear: Hearing, tympanic membrane, ear canal and external ear normal. Tympanic membrane is not erythematous, retracted or bulging.      Nose: No mucosal edema or rhinorrhea.     Right Sinus: No maxillary sinus tenderness or frontal sinus tenderness.     Left Sinus: No maxillary sinus tenderness or frontal sinus tenderness.     Mouth/Throat:     Pharynx: Uvula midline.  Eyes:     General: Lids are normal. Lids are everted, no foreign bodies appreciated.     Conjunctiva/sclera: Conjunctivae normal.     Pupils: Pupils are equal, round, and reactive to light.  Neck:     Musculoskeletal: Normal range of motion and neck supple.     Thyroid: No thyroid mass or thyromegaly.     Vascular: No carotid bruit.     Trachea: Trachea normal.  Cardiovascular:     Rate and Rhythm: Normal rate and regular rhythm.     Pulses: Normal pulses.     Heart sounds: Normal heart sounds, S1 normal and S2 normal. No murmur. No friction rub. No gallop.   Pulmonary:     Effort: Pulmonary effort is normal. No tachypnea or respiratory distress.     Breath sounds: Normal breath sounds. No decreased breath sounds, wheezing, rhonchi or rales.  Abdominal:     General: Bowel sounds are normal.     Palpations: Abdomen is soft.     Tenderness: There is no abdominal tenderness.  Skin:    General: Skin is warm and dry.     Findings: No rash.  Neurological:     Mental Status: She is alert.  Psychiatric:        Mood and Affect: Mood is depressed. Mood is not anxious. Affect is flat.        Speech: She is noncommunicative.        Behavior: Behavior normal. Behavior is cooperative.        Thought Content: Thought content normal.        Cognition and Memory: Cognition is impaired. Memory is impaired.        Judgment: Judgment normal.     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  Continue.  NO clear sign of parkinson's  S/P neuro eval Mother with history of Alzheimer's.  Major depressive disorder, single episode,  moderate (HCC) SE to wellbutrin.  Add trazodone to prozac for sleep.  offered psych referral.. pt refused at this time. Follow up 4 weeks.     Eliezer Lofts, MD

## 2018-11-16 NOTE — Assessment & Plan Note (Signed)
SE to wellbutrin.  Add trazodone to prozac for sleep.  offered psych referral.. pt refused at this time. Follow up 4 weeks.

## 2018-11-16 NOTE — Assessment & Plan Note (Addendum)
Tolerating aricept 10 mg daily.  Continue.  NO clear sign of parkinson's  S/P neuro eval Mother with history of Alzheimer's.

## 2018-11-16 NOTE — Patient Instructions (Addendum)
Continue prozac 60 mg daily   Add trazodone at night for sleep.  Stop melatonin.  Try to walk some each daily.  Continue aricept daily.  Make sure drinking water daily, do not skip meals.

## 2018-12-07 ENCOUNTER — Other Ambulatory Visit: Payer: Self-pay | Admitting: Family Medicine

## 2018-12-08 ENCOUNTER — Other Ambulatory Visit: Payer: Self-pay | Admitting: Family Medicine

## 2018-12-14 ENCOUNTER — Ambulatory Visit (INDEPENDENT_AMBULATORY_CARE_PROVIDER_SITE_OTHER)
Admission: RE | Admit: 2018-12-14 | Discharge: 2018-12-14 | Disposition: A | Discharge status: Home / Self Care | Payer: Medicare Other | Source: Ambulatory Visit | Attending: Family Medicine | Admitting: Family Medicine

## 2018-12-14 ENCOUNTER — Encounter: Payer: Self-pay | Admitting: Family Medicine

## 2018-12-14 ENCOUNTER — Other Ambulatory Visit: Payer: Self-pay

## 2018-12-14 ENCOUNTER — Ambulatory Visit (INDEPENDENT_AMBULATORY_CARE_PROVIDER_SITE_OTHER): Payer: Medicare Other | Admitting: Family Medicine

## 2018-12-14 VITALS — BP 89/54 | HR 69 | Temp 98.7°F | Ht <= 58 in | Wt 133.2 lb

## 2018-12-14 DIAGNOSIS — M546 Pain in thoracic spine: Secondary | ICD-10-CM | POA: Diagnosis not present

## 2018-12-14 DIAGNOSIS — R634 Abnormal weight loss: Secondary | ICD-10-CM

## 2018-12-14 DIAGNOSIS — M4184 Other forms of scoliosis, thoracic region: Secondary | ICD-10-CM | POA: Diagnosis not present

## 2018-12-14 DIAGNOSIS — F5104 Psychophysiologic insomnia: Secondary | ICD-10-CM

## 2018-12-14 DIAGNOSIS — F0390 Unspecified dementia without behavioral disturbance: Secondary | ICD-10-CM | POA: Diagnosis not present

## 2018-12-14 DIAGNOSIS — F03B0 Unspecified dementia, moderate, without behavioral disturbance, psychotic disturbance, mood disturbance, and anxiety: Secondary | ICD-10-CM

## 2018-12-14 DIAGNOSIS — F321 Major depressive disorder, single episode, moderate: Secondary | ICD-10-CM | POA: Diagnosis not present

## 2018-12-14 DIAGNOSIS — Z23 Encounter for immunization: Secondary | ICD-10-CM | POA: Diagnosis not present

## 2018-12-14 NOTE — Assessment & Plan Note (Signed)
IMproved on trazodone. Encourage gentle exercise.

## 2018-12-14 NOTE — Assessment & Plan Note (Signed)
Improved with improved sleep.  Continue prozac and trazodone.

## 2018-12-14 NOTE — Progress Notes (Signed)
Chief Complaint  Patient presents with  . Memory Loss  . Depression    History of Present Illness: HPI   73 year old female presents for follow up memory loss/mood.  At last OV on 11/16/2018 SE to wellbutrin.Crystal Newton went back to previous dose of prozac.   Still poor sleep and decreased energy so started a trial of trazodone for sleep.  She is sleeping better at night now. Sleeps much better overall 6-7 hours a night, continuous   Mood is better overall. She has increased energy.Crystal Newton getting up earlier doing more things, more motivation.   Depression screen [facility] 2/9 12/14/2018 11/16/2018 09/26/2018  Decreased Interest 1 3 0  Down, Depressed, Hopeless 1 2 0  PHQ - 2 Score 2 5 0  Altered sleeping 1 3 0  Tired, decreased energy 1 3 0  Change in appetite 0 1 0  Feeling bad or failure about yourself  1 3 0  Trouble concentrating 1 1 0  Moving slowly or fidgety/restless 0 0 0  Suicidal thoughts 0 0 0  PHQ-9 Score 6 16 0  Difficult doing work/chores Somewhat difficult Somewhat difficult Not difficult at all    Moderate dementia without behavioral disturbance   Memory not worse but some better.  Tolerating aricept 10 mg daily.  Continue.  NO clear sign of parkinson's  S/P neuro eval Mother with history of Alzheimer's.  She has been having some pain in low back in last few month.. now more centrally in mid back in last month. No numbness, no weakness.. none. Pain in mid back is worse lately in the day. Worse when up on feet.  Has not treated with anything. No recent falls.  Normal bone density in 2017   COVID 19 screen No recent travel or known exposure to [place] The patient denies respiratory symptoms of COVID 19 at this time.  The importance of social distancing was discussed today.   Review of Systems  Constitutional: Negative for chills and fever.  HENT: Negative for congestion and ear pain.   Eyes: Negative for pain and redness.  Respiratory: Negative for cough and  shortness of breath.   Cardiovascular: Negative for chest pain, palpitations and leg swelling.  Gastrointestinal: Negative for abdominal pain, blood in stool, constipation, diarrhea, nausea and vomiting.  Genitourinary: Negative for dysuria.  Musculoskeletal: Positive for back pain. Negative for falls and myalgias.  Skin: Negative for rash.  Neurological: Negative for dizziness.  Psychiatric/Behavioral: Negative for depression. The patient is not nervous/anxious.       Past Medical History:  Diagnosis Date  . Anxiety   . Depression   . Diabetes mellitus   . Hyperlipidemia   . Hypertension   . Kidney stones     reports that she has never smoked. She has never used smokeless tobacco. She reports that she does not drink alcohol or use drugs.   Current Outpatient Medications:  .  atorvastatin (LIPITOR) 80 MG tablet, TAKE 1 TABLET BY MOUTH EVERY DAY, Disp: 90 tablet, Rfl: 3 .  donepezil (ARICEPT) 10 MG tablet, TAKE 1 TABLET BY MOUTH EVERY DAY EVERY NIGHT, Disp: 90 tablet, Rfl: 3 .  esomeprazole (NEXIUM) 40 MG capsule, Take 40 mg by mouth daily at 12 noon., Disp: , Rfl:  .  FLUoxetine (PROZAC) 20 MG tablet, Take 60 mg by mouth daily., Disp: , Rfl:  .  lisinopril (ZESTRIL) 5 MG tablet, TAKE 1 TABLET BY MOUTH EVERY DAY, Disp: 90 tablet, Rfl: 2 .  Melatonin 5 MG CAPS, ,  Disp: , Rfl:  .  Multiple Vitamins-Minerals (CENTRUM SILVER PO), Take 1 tablet by mouth daily., Disp: , Rfl:  .  propranolol (INDERAL) 40 MG tablet, TAKE 1 TABLET BY MOUTH TWICE A DAY, Disp: 180 tablet, Rfl: 1 .  traZODone (DESYREL) 50 MG tablet, TAKE 0.5-1 TABLETS BY MOUTH AT BEDTIME AS NEEDED FOR SLEEP., Disp: 90 tablet, Rfl: 0   Observations/Objective: Blood pressure (!) 89/54, pulse 69, temperature 98.7 F (37.1 C), temperature source Temporal, height 4' 8.5" (1.435 m), weight 133 lb 4 oz (60.4 kg), SpO2 94 %.  Physical Exam Constitutional:      General: She is not in acute distress.    Appearance: Normal appearance.  She is well-developed. She is not ill-appearing or toxic-appearing.  HENT:     Head: Normocephalic.     Right Ear: Hearing, tympanic membrane, ear canal and external ear normal. Tympanic membrane is not erythematous, retracted or bulging.     Left Ear: Hearing, tympanic membrane, ear canal and external ear normal. Tympanic membrane is not erythematous, retracted or bulging.     Nose: No mucosal edema or rhinorrhea.     Right Sinus: No maxillary sinus tenderness or frontal sinus tenderness.     Left Sinus: No maxillary sinus tenderness or frontal sinus tenderness.     Mouth/Throat:     Pharynx: Uvula midline.  Eyes:     General: Lids are normal. Lids are everted, no foreign bodies appreciated.     Conjunctiva/sclera: Conjunctivae normal.     Pupils: Pupils are equal, round, and reactive to light.  Neck:     Musculoskeletal: Normal range of motion and neck supple.     Thyroid: No thyroid mass or thyromegaly.     Vascular: No carotid bruit.     Trachea: Trachea normal.  Cardiovascular:     Rate and Rhythm: Normal rate and regular rhythm.     Pulses: Normal pulses.     Heart sounds: Normal heart sounds, S1 normal and S2 normal. No murmur. No friction rub. No gallop.   Pulmonary:     Effort: Pulmonary effort is normal. No tachypnea or respiratory distress.     Breath sounds: Normal breath sounds. No decreased breath sounds, wheezing, rhonchi or rales.  Abdominal:     General: Bowel sounds are normal.     Palpations: Abdomen is soft.     Tenderness: There is no abdominal tenderness.  Musculoskeletal:     Thoracic back: She exhibits decreased range of motion.     Comments: Thoracic khyphosis.. focal ttp at t8/t9  no parspinous ttp   Skin:    General: Skin is warm and dry.     Findings: No rash.  Neurological:     Mental Status: She is alert.     Cranial Nerves: Cranial nerves are intact.     Motor: Motor function is intact.  Psychiatric:        Mood and Affect: Mood is not anxious  or depressed.        Speech: Speech normal.        Behavior: Behavior normal. Behavior is cooperative.        Thought Content: Thought content normal.        Judgment: Judgment normal.     Wt Readings from Last 3 Encounters:  12/14/18 133 lb 4 oz (60.4 kg)  11/16/18 132 lb 8 oz (60.1 kg)  10/16/18 135 lb 8 oz (61.5 kg)     Assessment and Plan Major depressive disorder, single  episode, moderate (HCC) Improved with improved sleep.  Continue prozac and trazodone.  Weight loss, unintentional HAs gained some weight back with improved mood.  Chronic insomnia IMproved on trazodone. Encourage gentle exercise.  Acute thoracic back pain Send for x-ray given focal vertebral pain )( although DEXA was normal in 2017)  Can use tylenol, heat and start home PT.       Kerby NoraAmy Breonia Kirstein, MD

## 2018-12-14 NOTE — Patient Instructions (Signed)
We will call with X-ray results.  Can use tylenol extra strength as needed for back pain.  Heat on mid back and try gentle stretching exercises.

## 2018-12-14 NOTE — Assessment & Plan Note (Signed)
HAs gained some weight back with improved mood.

## 2018-12-14 NOTE — Assessment & Plan Note (Signed)
Send for x-ray given focal vertebral pain )( although DEXA was normal in 2017)  Can use tylenol, heat and start home PT.

## 2019-01-03 ENCOUNTER — Ambulatory Visit: Payer: Medicare Other | Admitting: Family Medicine

## 2019-01-08 ENCOUNTER — Encounter: Payer: Self-pay | Admitting: Family Medicine

## 2019-01-08 ENCOUNTER — Other Ambulatory Visit: Payer: Self-pay

## 2019-01-08 ENCOUNTER — Ambulatory Visit (INDEPENDENT_AMBULATORY_CARE_PROVIDER_SITE_OTHER): Payer: Medicare Other | Admitting: Family Medicine

## 2019-01-08 DIAGNOSIS — F0391 Unspecified dementia with behavioral disturbance: Secondary | ICD-10-CM | POA: Diagnosis not present

## 2019-01-08 DIAGNOSIS — F03B18 Unspecified dementia, moderate, with other behavioral disturbance: Secondary | ICD-10-CM

## 2019-01-08 MED ORDER — MEMANTINE HCL 5 MG PO TABS: 5.0000 mg | ORAL_TABLET | Freq: Two times a day (BID) | ORAL | 3 refills | Status: DC

## 2019-01-08 NOTE — Assessment & Plan Note (Signed)
  No new meds, no sign of pain or infection/edelerium.  Start memantine given may he stabilize memory as well as decrease agitation. Sleeping fairly well with trazodone. If still issues consider changing prozac to citalopram.. or changing sleep med to seroquel/ gabapentin.

## 2019-01-08 NOTE — Progress Notes (Signed)
Chief Complaint  Patient presents with  . Dementia  . Anger Issues    per husband    History of Present Illness: HPI  73 year old female with moderate dementia and MDD presents with worsening dementia now with anger and behavioral issues.  In last  Few weeks she has been more frustrated and more angry during the day. Feels good when she wakes up in AM. Sleeps well off and on She has been verbally aggressive with husband.  Using trazodone for sleep. On prozac 60 mg daily. Did not tolerate wellbutrin.   No new meds.  No new chest pain, no SOB. She denies pain.   She is on aricept 10 mg daily for dementia. COVID 19 screen No recent travel or known exposure to COVID19 The patient denies respiratory symptoms of COVID 19 at this time.  The importance of social distancing was discussed today.   Review of Systems  Constitutional: Negative for chills and fever.  HENT: Negative for congestion and ear pain.   Eyes: Negative for pain and redness.  Respiratory: Negative for cough and shortness of breath.   Cardiovascular: Negative for chest pain, palpitations and leg swelling.  Gastrointestinal: Negative for abdominal pain, blood in stool, constipation, diarrhea, nausea and vomiting.  Genitourinary: Negative for dysuria.  Musculoskeletal: Negative for falls and myalgias.  Skin: Negative for rash.  Neurological: Negative for dizziness.  Psychiatric/Behavioral: Negative for depression. The patient is not nervous/anxious.       Past Medical History:  Diagnosis Date  . Anxiety   . Depression   . Diabetes mellitus   . Hyperlipidemia   . Hypertension   . Kidney stones     reports that she has never smoked. She has never used smokeless tobacco. She reports that she does not drink alcohol or use drugs.   Current Outpatient Medications:  .  atorvastatin (LIPITOR) 80 MG tablet, TAKE 1 TABLET BY MOUTH EVERY DAY, Disp: 90 tablet, Rfl: 3 .  donepezil (ARICEPT) 10 MG tablet, TAKE 1  TABLET BY MOUTH EVERY DAY EVERY NIGHT, Disp: 90 tablet, Rfl: 3 .  esomeprazole (NEXIUM) 40 MG capsule, Take 40 mg by mouth daily at 12 noon., Disp: , Rfl:  .  FLUoxetine (PROZAC) 20 MG tablet, Take 60 mg by mouth daily., Disp: , Rfl:  .  lisinopril (ZESTRIL) 5 MG tablet, TAKE 1 TABLET BY MOUTH EVERY DAY, Disp: 90 tablet, Rfl: 2 .  Multiple Vitamins-Minerals (CENTRUM SILVER PO), Take 1 tablet by mouth daily., Disp: , Rfl:  .  propranolol (INDERAL) 40 MG tablet, TAKE 1 TABLET BY MOUTH TWICE A DAY, Disp: 180 tablet, Rfl: 1 .  traZODone (DESYREL) 50 MG tablet, TAKE 0.5-1 TABLETS BY MOUTH AT BEDTIME AS NEEDED FOR SLEEP., Disp: 90 tablet, Rfl: 0   Observations/Objective: Blood pressure 100/62, pulse 65, temperature 98.7 F (37.1 C), temperature source Temporal, height 4' 8.5" (1.435 m), weight 130 lb 4 oz (59.1 kg), SpO2 95 %.  Physical Exam Constitutional:      General: She is not in acute distress.    Appearance: Normal appearance. She is well-developed. She is not ill-appearing or toxic-appearing.  HENT:     Head: Normocephalic.     Right Ear: Hearing, tympanic membrane, ear canal and external ear normal. Tympanic membrane is not erythematous, retracted or bulging.     Left Ear: Hearing, tympanic membrane, ear canal and external ear normal. Tympanic membrane is not erythematous, retracted or bulging.     Nose: No mucosal edema or  rhinorrhea.     Right Sinus: No maxillary sinus tenderness or frontal sinus tenderness.     Left Sinus: No maxillary sinus tenderness or frontal sinus tenderness.     Mouth/Throat:     Pharynx: Uvula midline.  Eyes:     General: Lids are normal. Lids are everted, no foreign bodies appreciated.     Conjunctiva/sclera: Conjunctivae normal.     Pupils: Pupils are equal, round, and reactive to light.  Neck:     Musculoskeletal: Normal range of motion and neck supple.     Thyroid: No thyroid mass or thyromegaly.     Vascular: No carotid bruit.     Trachea: Trachea  normal.  Cardiovascular:     Rate and Rhythm: Normal rate and regular rhythm.     Pulses: Normal pulses.     Heart sounds: Normal heart sounds, S1 normal and S2 normal. No murmur. No friction rub. No gallop.   Pulmonary:     Effort: Pulmonary effort is normal. No tachypnea or respiratory distress.     Breath sounds: Normal breath sounds. No decreased breath sounds, wheezing, rhonchi or rales.  Abdominal:     General: Bowel sounds are normal.     Palpations: Abdomen is soft.     Tenderness: There is no abdominal tenderness.  Skin:    General: Skin is warm and dry.     Findings: No rash.  Neurological:     Mental Status: She is alert.     Cranial Nerves: Cranial nerves are intact.     Motor: Motor function is intact.     Comments: No shuffling gait, no cogwheeling, no tremor  Psychiatric:        Mood and Affect: Mood is not anxious or depressed.        Speech: Speech normal.        Behavior: Behavior normal. Behavior is cooperative.        Thought Content: Thought content normal.        Cognition and Memory: Cognition is impaired. Memory is impaired.        Judgment: Judgment normal.      Assessment and Plan   Moderate dementia with behavioral disturbance (HCC)  No new meds, no sign of pain or infection/edelerium.  Start memantine given may he stabilize memory as well as decrease agitation. Sleeping fairly well with trazodone. If still issues consider changing prozac to citalopram.. or changing sleep med to seroquel/ gabapentin.     Kerby Nora, MD

## 2019-01-08 NOTE — Patient Instructions (Addendum)
Start memantine  Twice daily.  MyChart or call with update in 4-6 weeks.  Call sooner if increased anger or agitation, things worsening.  in future if still issues we can consider changing prozac to citalopram.

## 2019-01-10 ENCOUNTER — Telehealth: Payer: Self-pay

## 2019-01-10 NOTE — Telephone Encounter (Signed)
Mr Abplanalp(DPR signed) said pt was seen on 01/08/19;anger issue has not worsened, Mr Chittenden said about the same but more agitated and irritable,Mr Hattabaugh has to watch what he says. Starting on 01/09/19 late afternoon pt began crying more and thinks she is not good for anybody.Mr Shiroma said no SI/HI. Pt has been taking the memantine 5 mg bid. Did not know if there could be any med adjustment or not. Mr Mullings has already made in office appt with Dr Diona Browner on 01/11/19 Mr Pomplun request cb after Dr Diona Browner reviews note; ED precautions given and Mr Fronek voiced understanding.

## 2019-01-10 NOTE — Telephone Encounter (Signed)
[facility] Night - Client Nonclinical Telephone Record AccessNurse Client [facility] Night - Client Client Site [place] - Night Physician Eliezer Lofts - MD Contact Type Call Who Is Calling Patient / Member / Family / Caregiver Caller Name [place] Phone Number [phone] Patient Name Crystal Newton Patient DOB 04-20-1945 Call Type Message Only Information Provided Reason for Call Request to Schedule Office Appointment Initial Comment Caller states wife's depression sx are worse yesterday and this morning and he would like to schedule her an appt. Additional Comment Call Closed By: [place] [facility] [employer] Date/Time: 01/10/2019 7:56:44 AM (ET)

## 2019-01-11 ENCOUNTER — Encounter: Payer: Self-pay | Admitting: Family Medicine

## 2019-01-11 ENCOUNTER — Ambulatory Visit (INDEPENDENT_AMBULATORY_CARE_PROVIDER_SITE_OTHER): Payer: Medicare Other | Admitting: Family Medicine

## 2019-01-11 ENCOUNTER — Other Ambulatory Visit: Payer: Self-pay

## 2019-01-11 VITALS — BP 90/58 | HR 57 | Temp 98.5°F | Ht <= 58 in | Wt 128.5 lb

## 2019-01-11 DIAGNOSIS — F0391 Unspecified dementia with behavioral disturbance: Secondary | ICD-10-CM

## 2019-01-11 DIAGNOSIS — F321 Major depressive disorder, single episode, moderate: Secondary | ICD-10-CM | POA: Diagnosis not present

## 2019-01-11 DIAGNOSIS — F03B18 Unspecified dementia, moderate, with other behavioral disturbance: Secondary | ICD-10-CM

## 2019-01-11 MED ORDER — QUETIAPINE FUMARATE 50 MG PO TABS: 50.0000 mg | ORAL_TABLET | Freq: Every day | ORAL | 1 refills | Status: DC

## 2019-01-11 NOTE — Patient Instructions (Signed)
Stop trazodone for sleep.  Start Seroquel at bedtime.

## 2019-01-11 NOTE — Progress Notes (Signed)
Chief Complaint  Patient presents with  . Follow-up    Dementia with behavioral disturbance    History of Present Illness: HPI   71 year olfd female presents for dementia with continued issues with behavoir disturbance, anger and depression. NEURO eval in 2016  At last OV we added Namenda to aricept to see if we could not improvement with agitation. She is on prozac 60 mg daily for depression. Trazodone for sleep.  Today her husband reports in last 3 days... she has not been sleeping at night at aall.  Did not sleep at all Tuesday night did not sleep at all.  "I feel like I am in the way.  I don't feel good about myself at all"  Last night only slept 4 hours.  She has been very tearful and sad.  Not really agitated or anger. Minimally responsive to people.  Sits all day.   COVID 19 screen No recent travel or known exposure to COVID19 The patient denies respiratory symptoms of COVID 19 at this time.  The importance of social distancing was discussed today.   Review of Systems  Constitutional: Negative for chills and fever.  HENT: Negative for congestion and ear pain.   Eyes: Negative for pain and redness.  Respiratory: Negative for cough and shortness of breath.   Cardiovascular: Negative for chest pain, palpitations and leg swelling.  Gastrointestinal: Negative for abdominal pain, blood in stool, constipation, diarrhea, nausea and vomiting.  Genitourinary: Negative for dysuria.  Musculoskeletal: Negative for falls and myalgias.  Skin: Negative for rash.  Neurological: Negative for dizziness.  Psychiatric/Behavioral: Positive for depression and memory loss. Negative for hallucinations and suicidal ideas. The patient has insomnia. The patient is not nervous/anxious.       Past Medical History:  Diagnosis Date  . Anxiety   . Depression   . Diabetes mellitus   . Hyperlipidemia   . Hypertension   . Kidney stones     reports that she has never smoked. She has never used  smokeless tobacco. She reports that she does not drink alcohol or use drugs.   Current Outpatient Medications:  .  atorvastatin (LIPITOR) 80 MG tablet, TAKE 1 TABLET BY MOUTH EVERY DAY, Disp: 90 tablet, Rfl: 3 .  donepezil (ARICEPT) 10 MG tablet, TAKE 1 TABLET BY MOUTH EVERY DAY EVERY NIGHT, Disp: 90 tablet, Rfl: 3 .  esomeprazole (NEXIUM) 40 MG capsule, Take 40 mg by mouth daily at 12 noon., Disp: , Rfl:  .  FLUoxetine (PROZAC) 20 MG tablet, Take 60 mg by mouth daily., Disp: , Rfl:  .  lisinopril (ZESTRIL) 5 MG tablet, TAKE 1 TABLET BY MOUTH EVERY DAY, Disp: 90 tablet, Rfl: 2 .  memantine (NAMENDA) 5 MG tablet, Take 1 tablet (5 mg total) by mouth 2 (two) times daily., Disp: 60 tablet, Rfl: 3 .  Multiple Vitamins-Minerals (CENTRUM SILVER PO), Take 1 tablet by mouth daily., Disp: , Rfl:  .  propranolol (INDERAL) 40 MG tablet, TAKE 1 TABLET BY MOUTH TWICE A DAY, Disp: 180 tablet, Rfl: 1 .  traZODone (DESYREL) 50 MG tablet, TAKE 0.5-1 TABLETS BY MOUTH AT BEDTIME AS NEEDED FOR SLEEP., Disp: 90 tablet, Rfl: 0   Observations/Objective: Blood pressure (!) 90/58, pulse (!) 57, temperature 98.5 F (36.9 C), temperature source Temporal, height 4' 8.5" (1.435 m), weight 128 lb 8 oz (58.3 kg), SpO2 96 %.  Physical Exam Constitutional:      General: She is not in acute distress.    Appearance: Normal appearance.  She is well-developed. She is not ill-appearing or toxic-appearing.  HENT:     Head: Normocephalic.     Right Ear: Hearing, tympanic membrane, ear canal and external ear normal. Tympanic membrane is not erythematous, retracted or bulging.     Left Ear: Hearing, tympanic membrane, ear canal and external ear normal. Tympanic membrane is not erythematous, retracted or bulging.     Nose: No mucosal edema or rhinorrhea.     Right Sinus: No maxillary sinus tenderness or frontal sinus tenderness.     Left Sinus: No maxillary sinus tenderness or frontal sinus tenderness.     Mouth/Throat:      Pharynx: Uvula midline.  Eyes:     General: Lids are normal. Lids are everted, no foreign bodies appreciated.     Conjunctiva/sclera: Conjunctivae normal.     Pupils: Pupils are equal, round, and reactive to light.  Neck:     Musculoskeletal: Normal range of motion and neck supple.     Thyroid: No thyroid mass or thyromegaly.     Vascular: No carotid bruit.     Trachea: Trachea normal.  Cardiovascular:     Rate and Rhythm: Normal rate and regular rhythm.     Pulses: Normal pulses.     Heart sounds: Normal heart sounds, S1 normal and S2 normal. No murmur. No friction rub. No gallop.   Pulmonary:     Effort: Pulmonary effort is normal. No tachypnea or respiratory distress.     Breath sounds: Normal breath sounds. No decreased breath sounds, wheezing, rhonchi or rales.  Abdominal:     General: Bowel sounds are normal.     Palpations: Abdomen is soft.     Tenderness: There is no abdominal tenderness.  Skin:    General: Skin is warm and dry.     Findings: No rash.  Neurological:     Mental Status: She is alert.  Psychiatric:        Mood and Affect: Mood is not anxious or depressed. Affect is blunt and flat.        Speech: Speech is delayed.        Behavior: Behavior is slowed and withdrawn. Behavior is cooperative.        Thought Content: Thought content normal. Thought content does not include homicidal or suicidal ideation.        Cognition and Memory: Cognition is impaired. She exhibits impaired recent memory.        Judgment: Judgment is inappropriate.      Assessment and Plan    Moderate dementia with behavioral disturbance (HCC)  Stop trazodone for sleep.  Start Seroquel at bedtime.   Major depressive disorder, single episode, moderate (HCC) Continue current med.    Kerby Nora, MD

## 2019-01-15 ENCOUNTER — Ambulatory Visit: Payer: Medicare Other | Admitting: Family Medicine

## 2019-01-18 MED ORDER — CITALOPRAM HYDROBROMIDE 40 MG PO TABS: 40.0000 mg | ORAL_TABLET | Freq: Every day | ORAL | 3 refills | Status: DC

## 2019-01-18 MED ORDER — PANTOPRAZOLE SODIUM 40 MG PO TBEC: 40.0000 mg | DELAYED_RELEASE_TABLET | Freq: Every day | ORAL | 3 refills | Status: DC

## 2019-01-30 ENCOUNTER — Other Ambulatory Visit: Payer: Self-pay | Admitting: Family Medicine

## 2019-02-02 ENCOUNTER — Other Ambulatory Visit: Payer: Self-pay | Admitting: Family Medicine

## 2019-02-04 NOTE — Telephone Encounter (Signed)
Pharmacy is requesting 90 day supply.  Ok to refill for #90?

## 2019-02-07 NOTE — Assessment & Plan Note (Signed)
Continue current med 

## 2019-02-07 NOTE — Assessment & Plan Note (Signed)
Stop trazodone for sleep.  Start Seroquel at bedtime. 

## 2019-02-09 ENCOUNTER — Other Ambulatory Visit: Payer: Self-pay | Admitting: Family Medicine

## 2019-02-24 DIAGNOSIS — G309 Alzheimer's disease, unspecified: Secondary | ICD-10-CM

## 2019-03-02 ENCOUNTER — Other Ambulatory Visit: Payer: Self-pay | Admitting: Family Medicine

## 2019-03-12 ENCOUNTER — Ambulatory Visit
Admission: RE | Admit: 2019-03-12 | Discharge: 2019-03-12 | Disposition: A | Discharge status: Home / Self Care | Payer: Medicare Other | Source: Ambulatory Visit | Attending: Family Medicine | Admitting: Family Medicine

## 2019-03-12 ENCOUNTER — Other Ambulatory Visit: Payer: Self-pay

## 2019-03-12 DIAGNOSIS — G309 Alzheimer's disease, unspecified: Secondary | ICD-10-CM | POA: Insufficient documentation

## 2019-03-21 ENCOUNTER — Other Ambulatory Visit: Payer: Self-pay

## 2019-03-21 ENCOUNTER — Ambulatory Visit (INDEPENDENT_AMBULATORY_CARE_PROVIDER_SITE_OTHER): Payer: Medicare Other | Admitting: Family Medicine

## 2019-03-21 ENCOUNTER — Encounter: Payer: Self-pay | Admitting: Family Medicine

## 2019-03-21 VITALS — BP 100/70 | HR 57 | Temp 98.0°F | Ht <= 58 in | Wt 129.8 lb

## 2019-03-21 DIAGNOSIS — F0151 Vascular dementia with behavioral disturbance: Secondary | ICD-10-CM | POA: Diagnosis not present

## 2019-03-21 DIAGNOSIS — F321 Major depressive disorder, single episode, moderate: Secondary | ICD-10-CM | POA: Diagnosis not present

## 2019-03-21 DIAGNOSIS — F01518 Vascular dementia, unspecified severity, with other behavioral disturbance: Secondary | ICD-10-CM

## 2019-03-21 DIAGNOSIS — F0391 Unspecified dementia with behavioral disturbance: Secondary | ICD-10-CM | POA: Diagnosis not present

## 2019-03-21 DIAGNOSIS — F03B18 Unspecified dementia, moderate, with other behavioral disturbance: Secondary | ICD-10-CM

## 2019-03-21 NOTE — Patient Instructions (Addendum)
Start baby aspirin.  Work on [employer]  We will call you with a referral to psychiatry.

## 2019-03-21 NOTE — Progress Notes (Signed)
Chief Complaint  Patient presents with  . Follow-up    Mood/Dementia    History of Present Illness: HPI   73 year old female presents for follow up depression, severe and moderate dementia. On Celexa 40 mg daily. Now on seroquel at bedtime for agitiation.  She is sleeping well.   Husband reports that her mood is much worse... she is doing okay but random things trigger her mood to be more irritable, angry followed by sadness, very tearful.  Frequently saying she does not want to live any more!  PHQ9 SCORE ONLY 12/14/2018 11/16/2018 09/26/2018  Score 6 16 0   She is on Namenda for chronic microvascular disease.  MRI: IMPRESSION: 1. Mild generalized atrophy and moderate white matter disease. This likely reflects the sequela of chronic microvascular ischemia. 2. No acute intracranial abnormality.  Has migraines when she was younger.. seems to be having > 2 headaches a    This visit occurred during the SARS-CoV-2 public health emergency.  Safety protocols were in place, including screening questions prior to the visit, additional usage of staff PPE, and extensive cleaning of exam room while observing appropriate contact time as indicated for disinfecting solutions.   COVID 19 screen:  No recent travel or known exposure to COVID19 The patient denies respiratory symptoms of COVID 19 at this time. The importance of social distancing was discussed today.     Review of Systems  Constitutional: Positive for malaise/fatigue. Negative for chills and fever.  HENT: Negative for congestion and ear pain.   Eyes: Negative for pain and redness.  Respiratory: Negative for cough and shortness of breath.   Cardiovascular: Negative for chest pain, palpitations and leg swelling.  Gastrointestinal: Negative for abdominal pain, blood in stool, constipation, diarrhea, nausea and vomiting.  Genitourinary: Negative for dysuria.  Musculoskeletal: Negative for falls and myalgias.  Skin: Negative  for rash.  Neurological: Negative for dizziness.  Psychiatric/Behavioral: Positive for depression, memory loss and suicidal ideas. The patient is nervous/anxious and has insomnia.       Past Medical History:  Diagnosis Date  . Anxiety   . Depression   . Diabetes mellitus   . Hyperlipidemia   . Hypertension   . Kidney stones     reports that she has never smoked. She has never used smokeless tobacco. She reports that she does not drink alcohol or use drugs.   Current Outpatient Medications:  .  atorvastatin (LIPITOR) 80 MG tablet, TAKE 1 TABLET BY MOUTH EVERY DAY, Disp: 90 tablet, Rfl: 3 .  citalopram (CELEXA) 40 MG tablet, TAKE 1 TABLET BY MOUTH EVERY DAY, Disp: 90 tablet, Rfl: 0 .  donepezil (ARICEPT) 10 MG tablet, TAKE 1 TABLET BY MOUTH EVERY DAY EVERY NIGHT, Disp: 90 tablet, Rfl: 3 .  lisinopril (ZESTRIL) 5 MG tablet, TAKE 1 TABLET BY MOUTH EVERY DAY, Disp: 90 tablet, Rfl: 2 .  memantine (NAMENDA) 5 MG tablet, TAKE 1 TABLET BY MOUTH TWICE A DAY, Disp: 180 tablet, Rfl: 0 .  Multiple Vitamins-Minerals (CENTRUM SILVER PO), Take 1 tablet by mouth daily., Disp: , Rfl:  .  pantoprazole (PROTONIX) 40 MG tablet, Take 1 tablet (40 mg total) by mouth daily., Disp: 30 tablet, Rfl: 3 .  propranolol (INDERAL) 40 MG tablet, TAKE 1 TABLET BY MOUTH TWICE A DAY, Disp: 180 tablet, Rfl: 1 .  QUEtiapine (SEROQUEL) 50 MG tablet, TAKE 1 TABLET BY MOUTH EVERYDAY AT BEDTIME, Disp: 90 tablet, Rfl: 0   Observations/Objective: Blood pressure 100/70, pulse (!) 57, temperature 98  F (36.7 C), temperature source Temporal, height 4' 8.5" (1.435 m), weight 129 lb 12 oz (58.9 kg), SpO2 92 %.  Physical Exam Constitutional:      General: She is not in acute distress.    Appearance: Normal appearance. She is well-developed. She is not ill-appearing or toxic-appearing.  HENT:     Head: Normocephalic.     Right Ear: Hearing, tympanic membrane, ear canal and external ear normal. Tympanic membrane is not  erythematous, retracted or bulging.     Left Ear: Hearing, tympanic membrane, ear canal and external ear normal. Tympanic membrane is not erythematous, retracted or bulging.     Nose: No mucosal edema or rhinorrhea.     Right Sinus: No maxillary sinus tenderness or frontal sinus tenderness.     Left Sinus: No maxillary sinus tenderness or frontal sinus tenderness.     Mouth/Throat:     Pharynx: Uvula midline.  Eyes:     General: Lids are normal. Lids are everted, no foreign bodies appreciated.     Conjunctiva/sclera: Conjunctivae normal.     Pupils: Pupils are equal, round, and reactive to light.  Neck:     Thyroid: No thyroid mass or thyromegaly.     Vascular: No carotid bruit.     Trachea: Trachea normal.  Cardiovascular:     Rate and Rhythm: Normal rate and regular rhythm.     Pulses: Normal pulses.     Heart sounds: Normal heart sounds, S1 normal and S2 normal. No murmur. No friction rub. No gallop.   Pulmonary:     Effort: Pulmonary effort is normal. No tachypnea or respiratory distress.     Breath sounds: Normal breath sounds. No decreased breath sounds, wheezing, rhonchi or rales.  Abdominal:     General: Bowel sounds are normal.     Palpations: Abdomen is soft.     Tenderness: There is no abdominal tenderness.  Musculoskeletal:     Cervical back: Normal range of motion and neck supple.  Skin:    General: Skin is warm and dry.     Findings: No rash.  Neurological:     Mental Status: She is alert.  Psychiatric:        Mood and Affect: Mood is not anxious or depressed. Affect is blunt and flat.        Speech: Speech is delayed.        Behavior: Behavior is slowed and withdrawn. Behavior is cooperative.        Thought Content: Thought content normal. Thought content does not include homicidal or suicidal ideation.        Cognition and Memory: Cognition is impaired. She exhibits impaired recent memory.        Judgment: Judgment is inappropriate.      Assessment and  Plan   Dementia, vascular, mixed, with behavioral disturbance (HCC) Start baby aspirin.  Work on [employer]  We will call you with a referral to psychiatry.  Continue namenda.    MDD (major depressive disorder), recurrent episode, severe (HCC) Poor control, failed trial of celexa.  Seroquel helps some with agitation.     Kerby Nora, MD

## 2019-03-22 ENCOUNTER — Ambulatory Visit: Payer: Medicare Other | Admitting: Family Medicine

## 2019-03-26 ENCOUNTER — Ambulatory Visit: Payer: Medicare Other | Admitting: Family Medicine

## 2019-04-15 ENCOUNTER — Other Ambulatory Visit: Payer: Self-pay | Admitting: Family Medicine

## 2019-04-26 NOTE — Assessment & Plan Note (Addendum)
Poor control, failed trial of celexa.  Seroquel helps some with agitation.

## 2019-04-26 NOTE — Assessment & Plan Note (Addendum)
Start baby aspirin.  Work on [employer]  We will call you with a referral to psychiatry.  Continue namenda.

## 2019-04-27 ENCOUNTER — Other Ambulatory Visit: Payer: Self-pay | Admitting: Family Medicine

## 2019-04-28 ENCOUNTER — Other Ambulatory Visit: Payer: Self-pay | Admitting: Family Medicine

## 2019-04-29 NOTE — Telephone Encounter (Signed)
Last office visit 03/21/2019 for MDD & Dementia.  Last refilled 01/30/2019 for #180 with no refills.  At last visit patient was referred to psychiatry.  No future appointments.  Ok to refill?

## 2019-04-29 NOTE — Telephone Encounter (Signed)
Last office visit 03/21/2019 for MDD & Dementia.  Last refilled 02/04/2019 for #90 with no refills.  At last visit patient was referred to psychiatry.  No future appointments.  Ok to refill?

## 2019-06-20 ENCOUNTER — Other Ambulatory Visit: Payer: Self-pay | Admitting: Family Medicine

## 2019-07-16 ENCOUNTER — Other Ambulatory Visit: Payer: Self-pay | Admitting: Family Medicine

## 2019-07-23 ENCOUNTER — Other Ambulatory Visit: Payer: Self-pay | Admitting: Family Medicine

## 2019-08-28 ENCOUNTER — Encounter: Payer: Self-pay | Admitting: Family Medicine

## 2019-08-28 DIAGNOSIS — H2513 Age-related nuclear cataract, bilateral: Secondary | ICD-10-CM | POA: Diagnosis not present

## 2019-08-28 LAB — HM DIABETES EYE EXAM

## 2019-09-23 LAB — HM DIABETES FOOT EXAM

## 2019-09-24 ENCOUNTER — Other Ambulatory Visit: Payer: Self-pay | Admitting: Family Medicine

## 2019-09-30 DIAGNOSIS — I10 Essential (primary) hypertension: Secondary | ICD-10-CM | POA: Diagnosis not present

## 2019-09-30 DIAGNOSIS — H2511 Age-related nuclear cataract, right eye: Secondary | ICD-10-CM | POA: Diagnosis not present

## 2019-10-04 ENCOUNTER — Telehealth: Payer: Self-pay | Admitting: Family Medicine

## 2019-10-04 DIAGNOSIS — E119 Type 2 diabetes mellitus without complications: Secondary | ICD-10-CM

## 2019-10-04 NOTE — Telephone Encounter (Signed)
-----   Message from Alvina Chou sent at 09/25/2019  2:36 PM EDT ----- Regarding: Lab orders for Tuesday, 6.22.21  AWV lab orders, please.

## 2019-10-08 ENCOUNTER — Other Ambulatory Visit (INDEPENDENT_AMBULATORY_CARE_PROVIDER_SITE_OTHER): Payer: Medicare Other

## 2019-10-08 ENCOUNTER — Ambulatory Visit: Payer: Medicare Other

## 2019-10-08 DIAGNOSIS — E119 Type 2 diabetes mellitus without complications: Secondary | ICD-10-CM | POA: Diagnosis not present

## 2019-10-08 LAB — LIPID PANEL
Cholesterol: 164 mg/dL (ref 0–200)
HDL: 58.9 mg/dL (ref >39.00)
LDL Cholesterol: 89 mg/dL (ref 0–99)
NonHDL: 104.89
Total CHOL/HDL Ratio: 3
Triglycerides: 77 mg/dL (ref 0.0–149.0)
VLDL: 15.4 mg/dL (ref 0.0–40.0)

## 2019-10-08 LAB — COMPREHENSIVE METABOLIC PANEL
ALT: 16 U/L (ref 0–35)
AST: 19 U/L (ref 0–37)
Albumin: 4.1 g/dL (ref 3.5–5.2)
Alkaline Phosphatase: 80 U/L (ref 39–117)
BUN: 24 mg/dL — ABNORMAL HIGH (ref 6–23)
CO2: 29 mEq/L (ref 19–32)
Calcium: 9.3 mg/dL (ref 8.4–10.5)
Chloride: 106 mEq/L (ref 96–112)
Creatinine, Ser: 0.66 mg/dL (ref 0.40–1.20)
GFR: 87.48 mL/min (ref >60.00)
Glucose, Bld: 99 mg/dL (ref 70–99)
Potassium: 4.2 mEq/L (ref 3.5–5.1)
Sodium: 142 mEq/L (ref 135–145)
Total Bilirubin: 0.6 mg/dL (ref 0.2–1.2)
Total Protein: 6 g/dL (ref 6.0–8.3)

## 2019-10-08 LAB — HEMOGLOBIN A1C: Hgb A1c MFr Bld: 5.6 % (ref 4.6–6.5)

## 2019-10-09 ENCOUNTER — Other Ambulatory Visit: Payer: Self-pay

## 2019-10-09 ENCOUNTER — Ambulatory Visit (INDEPENDENT_AMBULATORY_CARE_PROVIDER_SITE_OTHER): Payer: Medicare Other

## 2019-10-09 ENCOUNTER — Other Ambulatory Visit: Payer: Self-pay | Admitting: Family Medicine

## 2019-10-09 ENCOUNTER — Encounter: Payer: Self-pay | Admitting: Ophthalmology

## 2019-10-09 DIAGNOSIS — Z0000 Encounter for general adult medical examination without abnormal findings: Secondary | ICD-10-CM

## 2019-10-09 NOTE — Progress Notes (Signed)
Subjective:   Crystal Newton is a 74 y.o. female who presents for Medicare Annual (Subsequent) preventive examination.  Review of Systems: N/A     I connected with the patient today by telephone and verified that I am speaking with the correct person using two identifiers. Location patient: home Location nurse: work Persons participating in the virtual visit: patient, [name] (husband) and Engineer, civil (consulting)nurse.   I discussed the limitations, risks, security and privacy concerns of performing an evaluation and management service by telephone and the availability of in person appointments. I also discussed with the patient that there may be a patient responsible charge related to this service. The patient expressed understanding and verbally consented to this telephonic visit.    Interactive audio and video telecommunications were attempted between this nurse and patient, however failed, due to patient having technical difficulties OR patient did not have access to video capability.  We continued and completed visit with audio only.     Cardiac Risk Factors include: advanced age (>655men, 100>65 women);diabetes mellitus;hypertension;dyslipidemia     Objective:    Today's Vitals   10/09/19 0945  PainSc: 5    There is no height or weight on file to calculate BMI.  Advanced Directives 10/09/2019 09/26/2018 03/08/2017 03/08/2016  Does Patient Have a Medical Advance Directive? No No No Yes;No  Would patient like information on creating a medical advance directive? No - Patient declined No - Patient declined Yes (MAU/Ambulatory/Procedural Areas - Information given) Yes (MAU/Ambulatory/Procedural Areas - Information given)    Current Medications (verified) Outpatient Encounter Medications as of 10/09/2019  Medication Sig  . atorvastatin (LIPITOR) 80 MG tablet TAKE 1 TABLET BY MOUTH EVERY DAY  . citalopram (CELEXA) 40 MG tablet TAKE 1 TABLET BY MOUTH EVERY DAY  . donepezil (ARICEPT) 10 MG tablet TAKE 1 TABLET  BY MOUTH EVERY DAY EVERY NIGHT  . lisinopril (ZESTRIL) 5 MG tablet TAKE 1 TABLET BY MOUTH EVERY DAY  . memantine (NAMENDA) 5 MG tablet TAKE 1 TABLET BY MOUTH TWICE A DAY  . Multiple Vitamins-Minerals (CENTRUM SILVER PO) Take 1 tablet by mouth daily.  . pantoprazole (PROTONIX) 40 MG tablet TAKE 1 TABLET BY MOUTH EVERY DAY  . propranolol (INDERAL) 40 MG tablet TAKE 1 TABLET BY MOUTH TWICE A DAY  . QUEtiapine (SEROQUEL) 50 MG tablet TAKE 1 TABLET BY MOUTH EVERYDAY AT BEDTIME  . risperiDONE (RISPERDAL) 0.5 MG tablet Take by mouth.  . TRINTELLIX 20 MG TABS tablet Take 20 mg by mouth daily.   No facility-administered encounter medications on file as of 10/09/2019.    Allergies (verified) Patient has no known allergies.   History: Past Medical History:  Diagnosis Date  . Anxiety   . Depression   . Diabetes mellitus   . Hyperlipidemia   . Hypertension   . Kidney stones    Past Surgical History:  Procedure Laterality Date  . ABDOMINAL HYSTERECTOMY    . BREAST BIOPSY    . PARTIAL COLECTOMY  1996   ruptured abcess for diverticulitis  . PARTIAL HYSTERECTOMY     uterine prolapse  . right parotid     Family History  Problem Relation Age of Onset  . Alzheimer's disease Mother   . Diabetes Maternal Grandfather   . Coronary artery disease Maternal Grandfather    Social History   Socioeconomic History  . Marital status: Married    Spouse name: Not on file  . Number of children: Not on file  . Years of education: Not on file  .  Highest education level: Not on file  Occupational History  . Occupation: day care    Employer: RETIRED  Tobacco Use  . Smoking status: Never Smoker  . Smokeless tobacco: Never Used  Vaping Use  . Vaping Use: Never used  Substance and Sexual Activity  . Alcohol use: No    Alcohol/week: 0.0 standard drinks  . Drug use: No  . Sexual activity: Not Currently  Other Topics Concern  . Not on file  Social History Narrative   Regular exercise--no, but  walking on weekends.      Diet-- healthy, low carb, working on weight loss   No living will. Full Code. (Reviewed 2013)   Social Determinants of Health   Financial Resource Strain: Low Risk   . Difficulty of Paying Living Expenses: Not hard at all  Food Insecurity: No Food Insecurity  . Worried About Programme researcher, broadcasting/film/video in the Last Year: Never true  . Ran Out of Food in the Last Year: Never true  Transportation Needs: No Transportation Needs  . Lack of Transportation (Medical): No  . Lack of Transportation (Non-Medical): No  Physical Activity: Inactive  . Days of Exercise per Week: 0 days  . Minutes of Exercise per Session: 0 min  Stress: No Stress Concern Present  . Feeling of Stress : Not at all  Social Connections:   . Frequency of Communication with Friends and Family:   . Frequency of Social Gatherings with Friends and Family:   . Attends Religious Services:   . Active Member of Clubs or Organizations:   . Attends Banker Meetings:   Marland Kitchen Marital Status:     Tobacco Counseling Counseling given: Not Answered   Clinical Intake:  Pre-visit preparation completed: Yes  Pain : 0-10 Pain Score: 5  Pain Type: Chronic pain Pain Location: Shoulder Pain Orientation: Right Pain Descriptors / Indicators: Aching Pain Onset: More than a month ago Pain Frequency: Intermittent     Nutritional Risks: Nausea/ vomitting/ diarrhea (diarrhea) Diabetes: Yes CBG done?: No Did pt. bring in CBG monitor from home?: No  How often do you need to have someone help you when you read instructions, pamphlets, or other written materials from your doctor or pharmacy?: 1 - Never What is the last grade level you completed in school?: 12th  Diabetic: Yes Nutrition Risk Assessment:  Has the patient had any N/V/D within the last 2 months?  Yes , diarrhea from medications Does the patient have any non-healing wounds?  No  Has the patient had any unintentional weight loss or weight  gain?  No   Diabetes:  Is the patient diabetic?  Yes  If diabetic, was a CBG obtained today?  No  Did the patient bring in their glucometer from home?  No  How often do you monitor your CBG's? N/A.   Financial Strains and Diabetes Management:  Are you having any financial strains with the device, your supplies or your medication? No .  Does the patient want to be seen by Chronic Care Management for management of their diabetes?  No  Would the patient like to be referred to a Nutritionist or for Diabetic Management?  No   Diabetic Exams:  Diabetic Eye Exam: Completed 08/28/2019 Diabetic Foot Exam: Overdue, Pt has been advised about the importance in completing this exam. Pt is scheduled for diabetic foot exam on 10/15/2019.   Interpreter Needed?: No  Information entered by :: CJohnson, LPN   Activities of Daily Living In your present  state of health, do you have any difficulty performing the following activities: 10/09/2019  Hearing? N  Vision? N  Difficulty concentrating or making decisions? Y  Comment has bad dementia  Walking or climbing stairs? N  Dressing or bathing? N  Doing errands, shopping? Y  Preparing Food and eating ? N  Using the Toilet? N  In the past six months, have you accidently leaked urine? Y  Do you have problems with loss of bowel control? N  Managing your Medications? Y  Managing your Finances? Y  Housekeeping or managing your Housekeeping? Y  Some recent data might be hidden    Patient Care Team: Excell Seltzer, MD as PCP - General (Family Medicine) Dingeldein, Viviann Spare, MD as Referring Physician (Ophthalmology)  Indicate any recent Medical Services you may have received from other than [facility] providers in the past year (date may be approximate).     Assessment:   This is a routine wellness examination for Auria.  Hearing/Vision screen  Hearing Screening   125Hz  250Hz  500Hz  1000Hz  2000Hz  3000Hz  4000Hz  6000Hz  8000Hz   Right ear:           Left  ear:           Vision Screening Comments: Patient gets annual eye exams.   Dietary issues and exercise activities discussed: Current Exercise Habits: The patient does not participate in regular exercise at present, Exercise limited by: None identified  Goals    . Patient Stated     Starting 09/26/18, I will continue to take medications as prescribed.     . Patient Stated     10/09/2019, I will maintain and continue medications as prescribed.       Depression Screen PHQ 2/9 Scores 10/09/2019 03/21/2019 12/14/2018 11/16/2018 09/26/2018 09/15/2017 03/08/2017  PHQ - 2 Score 0 5 2 5  0 4 2  PHQ- 9 Score 0 16 6 16  0 16 14    Fall Risk Fall Risk  10/09/2019 09/26/2018 03/08/2017 03/08/2016 02/03/2015  Falls in the past year? 0 1 Yes No Yes  Comment - fell at doctor's office; broke glasses - - -  Number falls in past yr: 0 0 2 or more - 1  Injury with Fall? 0 1 Yes - No  Risk Factor Category  - - - - -  Risk for fall due to : Medication side effect - - - -  Follow up Falls evaluation completed;Falls prevention discussed - - - -    Any stairs in or around the home? Yes  If so, are there any without handrails? No  Home free of loose throw rugs in walkways, pet beds, electrical cords, etc? Yes  Adequate lighting in your home to reduce risk of falls? Yes   ASSISTIVE DEVICES UTILIZED TO PREVENT FALLS:  Life alert? No  Use of a cane, walker or w/c? No  Grab bars in the bathroom? No  Shower chair or bench in shower? Yes  Elevated toilet seat or a handicapped toilet? No   TIMED UP AND GO:  Was the test performed? N/A, telephonic visit.    Cognitive Function: MMSE - Mini Mental State Exam 10/09/2019 09/26/2018 03/08/2017  Not completed: Unable to complete (No Data) -  Orientation to time - - 5  Orientation to Place - - 5  Registration - - 3  Attention/ Calculation - - 0  Recall - - 2  Recall-comments - - unable to recall 1 of 3 words  Language- name 2 objects - - 0  Language- repeat - -  1  Language- follow 3 step command - - 3  Language- read & follow direction - - 0  Write a sentence - - 0  Copy design - - 0  Total score - - 19  Mini Cog  Mini-Cog screen was not completed. Patient has dementia. Maximum score is 22. A value of 0 denotes this part of the MMSE was not completed or the patient failed this part of the Mini-Cog screening.       Immunizations Immunization History  Administered Date(s) Administered  . Influenza Split 01/13/2011, 02/23/2012  . Influenza Whole 03/02/2007, 01/14/2008, 02/26/2009, 01/21/2010  . Influenza,inj,Quad PF,6+ Mos 12/18/2012, 01/07/2014, 02/03/2015, 01/15/2016, 01/13/2017, 02/06/2018, 12/14/2018  . PFIZER SARS-COV-2 Vaccination 06/12/2019, 07/03/2019  . Pneumococcal Conjugate-13 01/07/2014  . Pneumococcal Polysaccharide-23 03/14/2005, 10/11/2010  . Td 03/14/2005    TDAP status: Due, Education has been provided regarding the importance of this vaccine. Advised may receive this vaccine at local pharmacy or Health Dept. Aware to provide a copy of the vaccination record if obtained from local pharmacy or Health Dept. Verbalized acceptance and understanding. Flu Vaccine status: Up to date Pneumococcal vaccine status: Up to date Covid-19 vaccine status: Completed vaccines  Qualifies for Shingles Vaccine? Yes   Zostavax completed No   Shingrix Completed?: No.    Education has been provided regarding the importance of this vaccine. Patient has been advised to call insurance company to determine out of pocket expense if they have not yet received this vaccine. Advised may also receive vaccine at local pharmacy or Health Dept. Verbalized acceptance and understanding.  Screening Tests Health Maintenance  Topic Date Due  . FOOT EXAM  03/14/2018  . TETANUS/TDAP  04/17/2020 (Originally 03/15/2015)  . MAMMOGRAM  10/08/2020 (Originally 03/15/2018)  . INFLUENZA VACCINE  11/17/2019  . HEMOGLOBIN A1C  04/08/2020  . Fecal DNA (Cologuard)   05/08/2020  . OPHTHALMOLOGY EXAM  08/27/2020  . DEXA SCAN  Completed  . COVID-19 Vaccine  Completed  . Hepatitis C Screening  Completed  . PNA vac Low Risk Adult  Completed    Health Maintenance  Health Maintenance Due  Topic Date Due  . FOOT EXAM  03/14/2018    Colorectal cancer screening: Cologuard completed 05/08/2017. Repeat every 3 years Mammogram status: Patient declined. Bone Density status: Patient declined.   Lung Cancer Screening: (Low Dose CT Chest recommended if Age 65-80 years, 30 pack-year currently smoking OR have quit w/in 15years.) does not qualify.     Additional Screening:  Hepatitis C Screening: does not qualify; Completed N/A  Vision Screening: Recommended annual ophthalmology exams for early detection of glaucoma and other disorders of the eye. Is the patient up to date with their annual eye exam?  Yes  Who is the provider or what is the name of the office in which the patient attends annual eye exams? Dr. Denese Killings If pt is not established with a provider, would they like to be referred to a provider to establish care? No .   Dental Screening: Recommended annual dental exams for proper oral hygiene  Community Resource Referral / Chronic Care Management: CRR required this visit?  No   CCM required this visit?  No      Plan:     I have personally reviewed and noted the following in the patient's chart:   . Medical and social history . Use of alcohol, tobacco or illicit drugs  . Current medications and supplements . Functional ability and status . Nutritional  status . Physical activity . Advanced directives . List of other physicians . Hospitalizations, surgeries, and ER visits in previous 12 months . Vitals . Screenings to include cognitive, depression, and falls . Referrals and appointments  In addition, I have reviewed and discussed with patient certain preventive protocols, quality metrics, and best practice recommendations. A  written personalized care plan for preventive services as well as general preventive health recommendations were provided to patient.   Due to this being a telephonic visit, the after visit summary with patients personalized plan was offered to patient via mail or my-chart. Patient preferred to pick up at office at next visit.  Andrez Grime, LPN   1/75/1025

## 2019-10-09 NOTE — Progress Notes (Signed)
No critical labs need to be addressed urgently. We will discuss labs in detail at upcoming office visit.   

## 2019-10-09 NOTE — Patient Instructions (Signed)
Crystal Newton , Thank you for taking time to come for your Medicare Wellness Visit. I appreciate your ongoing commitment to your health goals. Please review the following plan we discussed and let me know if I can assist you in the future.   Screening recommendations/referrals: Colonoscopy: Cologuard completed 05/08/2017, due 04/2020 Mammogram: declined Bone Density: declined Recommended yearly ophthalmology/optometry visit for glaucoma screening and checkup Recommended yearly dental visit for hygiene and checkup  Vaccinations: Influenza vaccine: Up to date, completed 12/14/2018, due 11/2019 Pneumococcal vaccine: Completed series Tdap vaccine: decline Shingles vaccine: due, check with insurance    COVID vaccine: Completed series  Advanced directives: Advance directive discussed with you today. Even though you declined this today please call our office should you change your mind and we can give you the proper paperwork for you to fill out.  Conditions/risks identified: diabetes, hypertension, hyperlipidemia  Next appointment: AWV in 1 year    Preventive Care 74 Years and Older, Female Preventive care refers to lifestyle choices and visits with your health care provider that can promote health and wellness. What does preventive care include?  A yearly physical exam. This is also called an annual well check.  Dental exams once or twice a year.  Routine eye exams. Ask your health care provider how often you should have your eyes checked.  Personal lifestyle choices, including:  Daily care of your teeth and gums.  Regular physical activity.  Eating a healthy diet.  Avoiding tobacco and drug use.  Limiting alcohol use.  Practicing safe sex.  Taking low-dose aspirin every day.  Taking vitamin and mineral supplements as recommended by your health care provider. What happens during an annual well check? The services and screenings done by your health care provider during your  annual well check will depend on your age, overall health, lifestyle risk factors, and family history of disease. Counseling  Your health care provider may ask you questions about your:  Alcohol use.  Tobacco use.  Drug use.  Emotional well-being.  Home and relationship well-being.  Sexual activity.  Eating habits.  History of falls.  Memory and ability to understand (cognition).  Work and work Astronomer.  Reproductive health. Screening  You may have the following tests or measurements:  Height, weight, and BMI.  Blood pressure.  Lipid and cholesterol levels. These may be checked every 5 years, or more frequently if you are over 29 years old.  Skin check.  Lung cancer screening. You may have this screening every year starting at age 65 if you have a 30-pack-year history of smoking and currently smoke or have quit within the past 15 years.  Fecal occult blood test (FOBT) of the stool. You may have this test every year starting at age 76.  Flexible sigmoidoscopy or colonoscopy. You may have a sigmoidoscopy every 5 years or a colonoscopy every 10 years starting at age 45.  Hepatitis C blood test.  Hepatitis B blood test.  Sexually transmitted disease (STD) testing.  Diabetes screening. This is done by checking your blood sugar (glucose) after you have not eaten for a while (fasting). You may have this done every 1-3 years.  Bone density scan. This is done to screen for osteoporosis. You may have this done starting at age 52.  Mammogram. This may be done every 1-2 years. Talk to your health care provider about how often you should have regular mammograms. Talk with your health care provider about your test results, treatment options, and if necessary, the need  for more tests. Vaccines  Your health care provider may recommend certain vaccines, such as:  Influenza vaccine. This is recommended every year.  Tetanus, diphtheria, and acellular pertussis (Tdap, Td)  vaccine. You may need a Td booster every 10 years.  Zoster vaccine. You may need this after age 62.  Pneumococcal 13-valent conjugate (PCV13) vaccine. One dose is recommended after age 32.  Pneumococcal polysaccharide (PPSV23) vaccine. One dose is recommended after age 67. Talk to your health care provider about which screenings and vaccines you need and how often you need them. This information is not intended to replace advice given to you by your health care provider. Make sure you discuss any questions you have with your health care provider. Document Released: 05/01/2015 Document Revised: 12/23/2015 Document Reviewed: 02/03/2015 Elsevier Interactive Patient Education  2017 [place] Prevention in the Home Falls can cause injuries. They can happen to people of all ages. There are many things you can do to make your home safe and to help prevent falls. What can I do on the outside of my home?  Regularly fix the edges of walkways and driveways and fix any cracks.  Remove anything that might make you trip as you walk through a door, such as a raised step or threshold.  Trim any bushes or trees on the path to your home.  Use bright outdoor lighting.  Clear any walking paths of anything that might make someone trip, such as rocks or tools.  Regularly check to see if handrails are loose or broken. Make sure that both sides of any steps have handrails.  Any raised decks and porches should have guardrails on the edges.  Have any leaves, snow, or ice cleared regularly.  Use sand or salt on walking paths during winter.  Clean up any spills in your garage right away. This includes oil or grease spills. What can I do in the bathroom?  Use night lights.  Install grab bars by the toilet and in the tub and shower. Do not use towel bars as grab bars.  Use non-skid mats or decals in the tub or shower.  If you need to sit down in the shower, use a plastic, non-slip  stool.  Keep the floor dry. Clean up any water that spills on the floor as soon as it happens.  Remove soap buildup in the tub or shower regularly.  Attach bath mats securely with double-sided non-slip rug tape.  Do not have throw rugs and other things on the floor that can make you trip. What can I do in the bedroom?  Use night lights.  Make sure that you have a light by your bed that is easy to reach.  Do not use any sheets or blankets that are too big for your bed. They should not hang down onto the floor.  Have a firm chair that has side arms. You can use this for support while you get dressed.  Do not have throw rugs and other things on the floor that can make you trip. What can I do in the kitchen?  Clean up any spills right away.  Avoid walking on wet floors.  Keep items that you use a lot in easy-to-reach places.  If you need to reach something above you, use a strong step stool that has a grab bar.  Keep electrical cords out of the way.  Do not use floor polish or wax that makes floors slippery. If you must use wax, use  non-skid floor wax.  Do not have throw rugs and other things on the floor that can make you trip. What can I do with my stairs?  Do not leave any items on the stairs.  Make sure that there are handrails on both sides of the stairs and use them. Fix handrails that are broken or loose. Make sure that handrails are as long as the stairways.  Check any carpeting to make sure that it is firmly attached to the stairs. Fix any carpet that is loose or worn.  Avoid having throw rugs at the top or bottom of the stairs. If you do have throw rugs, attach them to the floor with carpet tape.  Make sure that you have a light switch at the top of the stairs and the bottom of the stairs. If you do not have them, ask someone to add them for you. What else can I do to help prevent falls?  Wear shoes that:  Do not have high heels.  Have rubber bottoms.  Are  comfortable and fit you well.  Are closed at the toe. Do not wear sandals.  If you use a stepladder:  Make sure that it is fully opened. Do not climb a closed stepladder.  Make sure that both sides of the stepladder are locked into place.  Ask someone to hold it for you, if possible.  Clearly mark and make sure that you can see:  Any grab bars or handrails.  First and last steps.  Where the edge of each step is.  Use tools that help you move around (mobility aids) if they are needed. These include:  Canes.  Walkers.  Scooters.  Crutches.  Turn on the lights when you go into a dark area. Replace any light bulbs as soon as they burn out.  Set up your furniture so you have a clear path. Avoid moving your furniture around.  If any of your floors are uneven, fix them.  If there are any pets around you, be aware of where they are.  Review your medicines with your doctor. Some medicines can make you feel dizzy. This can increase your chance of falling. Ask your doctor what other things that you can do to help prevent falls. This information is not intended to replace advice given to you by your health care provider. Make sure you discuss any questions you have with your health care provider. Document Released: 01/29/2009 Document Revised: 09/10/2015 Document Reviewed: 05/09/2014 Elsevier Interactive Patient Education  2017 [employer].

## 2019-10-09 NOTE — Progress Notes (Signed)
PCP notes:  Health Maintenance: Foot exam- due Mammogram- declined Dexa- declined   Abnormal Screenings: none   Patient concerns: Right shoulder pain Nodule on right side of neck burning sensation on bottom of both feet   Nurse concerns:  none  Next PCP appt.: 10/15/2019 @ 3:20 pm

## 2019-10-15 ENCOUNTER — Other Ambulatory Visit: Payer: Self-pay

## 2019-10-15 ENCOUNTER — Encounter: Payer: Self-pay | Admitting: Family Medicine

## 2019-10-15 ENCOUNTER — Other Ambulatory Visit: Payer: Medicare Other | Attending: Ophthalmology

## 2019-10-15 ENCOUNTER — Ambulatory Visit (INDEPENDENT_AMBULATORY_CARE_PROVIDER_SITE_OTHER): Payer: Medicare Other | Admitting: Family Medicine

## 2019-10-15 VITALS — BP 110/60 | HR 60 | Temp 98.4°F | Ht 59.0 in | Wt 134.8 lb

## 2019-10-15 DIAGNOSIS — Z0000 Encounter for general adult medical examination without abnormal findings: Secondary | ICD-10-CM

## 2019-10-15 DIAGNOSIS — M25511 Pain in right shoulder: Secondary | ICD-10-CM

## 2019-10-15 DIAGNOSIS — F332 Major depressive disorder, recurrent severe without psychotic features: Secondary | ICD-10-CM

## 2019-10-15 DIAGNOSIS — F01518 Vascular dementia, unspecified severity, with other behavioral disturbance: Secondary | ICD-10-CM

## 2019-10-15 DIAGNOSIS — G309 Alzheimer's disease, unspecified: Secondary | ICD-10-CM | POA: Diagnosis not present

## 2019-10-15 DIAGNOSIS — R6889 Other general symptoms and signs: Secondary | ICD-10-CM

## 2019-10-15 DIAGNOSIS — E119 Type 2 diabetes mellitus without complications: Secondary | ICD-10-CM

## 2019-10-15 DIAGNOSIS — E785 Hyperlipidemia, unspecified: Secondary | ICD-10-CM

## 2019-10-15 DIAGNOSIS — I10 Essential (primary) hypertension: Secondary | ICD-10-CM | POA: Diagnosis not present

## 2019-10-15 DIAGNOSIS — F0151 Vascular dementia with behavioral disturbance: Secondary | ICD-10-CM

## 2019-10-15 MED ORDER — MELOXICAM 15 MG PO TABS
15.0000 mg | ORAL_TABLET | Freq: Every day | ORAL | 0 refills | Status: DC
Start: 2019-10-15 — End: 2019-11-07

## 2019-10-15 MED ORDER — MEMANTINE HCL 10 MG PO TABS: 5.0000 mg | ORAL_TABLET | Freq: Two times a day (BID) | ORAL | 3 refills | Status: AC

## 2019-10-15 NOTE — Patient Instructions (Addendum)
Please stop at the lab to have labs drawn.  Hold lisinopril.. follow BP off of the medication.  Increased namenda to 10 mg twice daily Start home PT on right shoulder.  Use meloxicam daily for pain in shoulder... call if not improving as expected.

## 2019-10-15 NOTE — Progress Notes (Signed)
Chief Complaint  Patient presents with   Annual Exam    Part 2    History of Present Illness: HPI   The patient presents for  complete physical and review of chronic health problems. He/She also has the following acute concerns today: New onset pain in right shoulder ongoing for 6 months.  no fall, no known injuries   She has noted a small knot on right neck.Marland Kitchen nonpainful... x 3- weeks.  She has very cold hands and feet.   The patient saw a LPN or RN for medicare wellness visit.  Prevention and wellness was reviewed in detail. Note reviewed and important notes copied below.  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 now on Trintillex and  Risperdal.  Mood is much better overall. She is less agitated, happier overall.  Vascular dementia on Aricept and namenda..  Memory has continued to worsen.  eating better now mood better. Wt Readings from Last 3 Encounters:  10/15/19 134 lb 12 oz (61.1 kg)  03/21/19 129 lb 12 oz (58.9 kg)  01/11/19 128 lb 8 oz (58.3 kg)    Diabetes:   Good control on no medication Lab Results  Component Value Date   HGBA1C 5.6 10/08/2019  Using medications without difficulties: Hypoglycemic episodes: Hyperglycemic episodes: Feet problems: no ulcers Blood Sugars averaging: none eye exam within last year: yes  Hypertension:  Good control BP on lisinopril BP Readings from Last 3 Encounters:  10/15/19 110/60  03/21/19 100/70  01/11/19 (!) 90/58  Using medication without problems or lightheadedness: occ Chest pain with exertion: none Edema:none Short of breath:none Average home BPs: Other issues:  Cholesterol at goal on  atorvastatin. Lab Results  Component Value Date   CHOL 164 10/08/2019   HDL 58.90 10/08/2019   LDLCALC 89 10/08/2019   LDLDIRECT 214.9 12/14/2012   TRIG 77.0 10/08/2019   CHOLHDL 3 10/08/2019      This visit occurred  during the SARS-CoV-2 public health emergency.  Safety protocols were in place, including screening questions prior to the visit, additional usage of staff PPE, and extensive cleaning of exam room while observing appropriate contact time as indicated for disinfecting solutions.   COVID 19 screen:  No recent travel or known exposure to COVID19 The patient denies respiratory symptoms of COVID 19 at this time. The importance of social distancing was discussed today.     Review of Systems  Constitutional: Negative for chills and fever.  HENT: Negative for congestion and ear pain.   Eyes: Negative for pain and redness.  Respiratory: Negative for cough and shortness of breath.   Cardiovascular: Negative for chest pain, palpitations and leg swelling.  Gastrointestinal: Negative for abdominal pain, blood in stool, constipation, diarrhea, nausea and vomiting.  Genitourinary: Negative for dysuria.  Musculoskeletal: Negative for falls and myalgias.  Skin: Negative for rash.  Neurological: Negative for dizziness.  Psychiatric/Behavioral: Positive for depression and memory loss. The patient is nervous/anxious.       Past Medical History:  Diagnosis Date   Anxiety    Depression    Diabetes mellitus    diet controlled   Hyperlipidemia    Hypertension    Kidney stones    Memory difficulties    TIA (transient ischemic attack)    found on MRI.    reports that she has never smoked. She has never used smokeless tobacco. She reports that she does not drink alcohol and does  not use drugs.   Current Outpatient Medications:    ASPIRIN 81 PO, Take by mouth daily., Disp: , Rfl:    atorvastatin (LIPITOR) 80 MG tablet, TAKE 1 TABLET BY MOUTH EVERY DAY, Disp: 90 tablet, Rfl: 3   donepezil (ARICEPT) 10 MG tablet, TAKE 1 TABLET BY MOUTH EVERY DAY EVERY NIGHT, Disp: 90 tablet, Rfl: 3   lisinopril (ZESTRIL) 5 MG tablet, TAKE 1 TABLET BY MOUTH EVERY DAY, Disp: 90 tablet, Rfl: 0   memantine  (NAMENDA) 5 MG tablet, TAKE 1 TABLET BY MOUTH TWICE A DAY, Disp: 180 tablet, Rfl: 0   Multiple Vitamins-Minerals (CENTRUM SILVER PO), Take 1 tablet by mouth daily., Disp: , Rfl:    pantoprazole (PROTONIX) 40 MG tablet, TAKE 1 TABLET BY MOUTH EVERY DAY, Disp: 90 tablet, Rfl: 2   propranolol (INDERAL) 40 MG tablet, TAKE 1 TABLET BY MOUTH TWICE A DAY, Disp: 180 tablet, Rfl: 0   risperiDONE (RISPERDAL) 0.5 MG tablet, Take by mouth., Disp: , Rfl:    TRINTELLIX 20 MG TABS tablet, Take 20 mg by mouth daily., Disp: , Rfl:    vitamin B-12 (CYANOCOBALAMIN) 1000 MCG tablet, Take 1,000 mcg by mouth daily., Disp: , Rfl:    Vitamin D3 (VITAMIN D) 25 MCG tablet, Take 1,000 Units by mouth daily., Disp: , Rfl:    Observations/Objective: Blood pressure 110/60, pulse 60, temperature 98.4 F (36.9 C), temperature source Temporal, height 4\' 11"  (1.499 m), weight 134 lb 12 oz (61.1 kg), SpO2 95 %.  Physical Exam Constitutional:      General: She is not in acute distress.    Appearance: She is well-developed. She is not ill-appearing or toxic-appearing.  HENT:     Head: Normocephalic.     Right Ear: Hearing, tympanic membrane, ear canal and external ear normal. Tympanic membrane is not erythematous, retracted or bulging.     Left Ear: Hearing, tympanic membrane, ear canal and external ear normal. Tympanic membrane is not erythematous, retracted or bulging.     Nose: Mucosal edema present. No rhinorrhea.     Right Sinus: No maxillary sinus tenderness or frontal sinus tenderness.     Left Sinus: No maxillary sinus tenderness or frontal sinus tenderness.     Mouth/Throat:     Pharynx: Uvula midline.  Eyes:     General: Lids are normal. Lids are everted, no foreign bodies appreciated.     Conjunctiva/sclera: Conjunctivae normal.     Pupils: Pupils are equal, round, and reactive to light.  Neck:     Thyroid: No thyroid mass or thyromegaly.     Vascular: No carotid bruit.     Trachea: Trachea normal.   Cardiovascular:     Rate and Rhythm: Normal rate and regular rhythm.     Pulses: Normal pulses.     Heart sounds: Normal heart sounds, S1 normal and S2 normal. No murmur heard.  No friction rub. No gallop.   Pulmonary:     Effort: Pulmonary effort is normal. No tachypnea or respiratory distress.     Breath sounds: Normal breath sounds. No decreased breath sounds, wheezing, rhonchi or rales.  Musculoskeletal:     Cervical back: Normal range of motion and neck supple.  Skin:    General: Skin is warm and dry.     Findings: No rash.  Neurological:     Mental Status: She is alert. Mental status is at baseline. She is disoriented.  Psychiatric:        Mood and Affect: Mood is not  anxious or depressed.        Speech: Speech normal.        Behavior: Behavior normal. Behavior is cooperative.        Judgment: Judgment normal.      Diabetic foot exam: Normal inspection No skin breakdown No calluses  Normal DP pulses Normal sensation to light touch and monofilament Nails normal  Assessment and Plan The patient's preventative maintenance and recommended screening tests for an annual wellness exam were reviewed in full today. Brought up to date unless services declined.  Counselled on the importance of diet, exercise, and its role in overall health and mortality. The patient's FH and SH was reviewed, including their home life, tobacco status, and drug and alcohol status.   Vaccines: Uptodate withFlu,prevnar and pneumovax, shingles, tdvaccine not covered by insurance.  S/P COVID19 vaccine Colon: Last colonoscopy 2002 Dr. Eliberto Ivory in [place], [place]. Was due in 2012. Plan cologuard 2022 DVE/pap: partial hysterectomy, has both ovaries. Asymptomatic, no family history. Nml DVE 2012, not interested in further DVE unless symptomatic.  Mammo:nml 2017, she refused further eval as would not do cancer treatment DEXA:No family history.2017 overall hip osteopenia, neck osteoporosis.Marland Kitchen   she refuses further DEXA. HEP C:done  Dementia, vascular, mixed, with behavioral disturbance (HCC) Increase namenda to 10 mg BID given worsening of symtpoms.  Diabetes mellitus with no complication Diet  Controlled.  Essential hypertension, benign  Hold lisinopril.. follow BP off of the medication.    Hyperlipidemia LDL goal <100 At goal on simvastatin.  Cold intolerance Will eval with labs for thyroid dysfunction.  Acute pain of right shoulder Start gentle home PT. Can use meloxicam for pain.    Kerby Nora, MD

## 2019-10-15 NOTE — Discharge Instructions (Signed)

## 2019-10-16 LAB — T4, FREE: Free T4: 0.91 ng/dL (ref 0.60–1.60)

## 2019-10-16 LAB — T3, FREE: T3, Free: 2.6 pg/mL (ref 2.3–4.2)

## 2019-10-16 LAB — TSH: TSH: 0.43 u[IU]/mL (ref 0.35–4.50)

## 2019-10-17 ENCOUNTER — Ambulatory Visit: Payer: Medicare Other | Admitting: Anesthesiology

## 2019-10-17 ENCOUNTER — Encounter
Admission: RE | Disposition: A | Discharge status: Home / Self Care | Payer: Self-pay | Source: Home / Self Care | Attending: Ophthalmology

## 2019-10-17 ENCOUNTER — Ambulatory Visit
Admission: RE | Admit: 2019-10-17 | Discharge: 2019-10-17 | Disposition: A | Discharge status: Home / Self Care | Payer: Medicare Other | Attending: Ophthalmology | Admitting: Ophthalmology

## 2019-10-17 ENCOUNTER — Encounter: Payer: Self-pay | Admitting: Ophthalmology

## 2019-10-17 ENCOUNTER — Other Ambulatory Visit: Payer: Self-pay

## 2019-10-17 DIAGNOSIS — Z79899 Other long term (current) drug therapy: Secondary | ICD-10-CM | POA: Diagnosis not present

## 2019-10-17 DIAGNOSIS — H2511 Age-related nuclear cataract, right eye: Secondary | ICD-10-CM | POA: Insufficient documentation

## 2019-10-17 DIAGNOSIS — E119 Type 2 diabetes mellitus without complications: Secondary | ICD-10-CM | POA: Insufficient documentation

## 2019-10-17 DIAGNOSIS — F0390 Unspecified dementia without behavioral disturbance: Secondary | ICD-10-CM | POA: Insufficient documentation

## 2019-10-17 DIAGNOSIS — F419 Anxiety disorder, unspecified: Secondary | ICD-10-CM | POA: Insufficient documentation

## 2019-10-17 DIAGNOSIS — H25811 Combined forms of age-related cataract, right eye: Secondary | ICD-10-CM | POA: Diagnosis not present

## 2019-10-17 DIAGNOSIS — Z8711 Personal history of peptic ulcer disease: Secondary | ICD-10-CM | POA: Insufficient documentation

## 2019-10-17 DIAGNOSIS — M1990 Unspecified osteoarthritis, unspecified site: Secondary | ICD-10-CM | POA: Insufficient documentation

## 2019-10-17 DIAGNOSIS — F329 Major depressive disorder, single episode, unspecified: Secondary | ICD-10-CM | POA: Insufficient documentation

## 2019-10-17 DIAGNOSIS — I10 Essential (primary) hypertension: Secondary | ICD-10-CM | POA: Diagnosis not present

## 2019-10-17 DIAGNOSIS — Z7982 Long term (current) use of aspirin: Secondary | ICD-10-CM | POA: Diagnosis not present

## 2019-10-17 DIAGNOSIS — Z8673 Personal history of transient ischemic attack (TIA), and cerebral infarction without residual deficits: Secondary | ICD-10-CM | POA: Insufficient documentation

## 2019-10-17 DIAGNOSIS — E7800 Pure hypercholesterolemia, unspecified: Secondary | ICD-10-CM | POA: Diagnosis not present

## 2019-10-17 HISTORY — DX: Transient cerebral ischemic attack, unspecified: G45.9

## 2019-10-17 HISTORY — PX: CATARACT EXTRACTION W/PHACO: SHX586

## 2019-10-17 HISTORY — DX: Other amnesia: R41.3

## 2019-10-17 SURGERY — PHACOEMULSIFICATION, CATARACT, WITH IOL INSERTION
Anesthesia: Monitor Anesthesia Care | Site: Eye | Laterality: Right

## 2019-10-17 MED ORDER — NA CHONDROIT SULF-NA HYALURON 40-17 MG/ML IO SOLN
INTRAOCULAR | Status: DC | PRN
  Administered 2019-10-17: 1 mL via INTRAOCULAR

## 2019-10-17 MED ORDER — ACETAMINOPHEN 325 MG PO TABS: 325.0000 mg | ORAL_TABLET | ORAL | Status: DC | PRN

## 2019-10-17 MED ORDER — ONDANSETRON HCL 4 MG/2ML IJ SOLN: 4.0000 mg | Freq: Once | INTRAMUSCULAR | Status: DC | PRN

## 2019-10-17 MED ORDER — TRYPAN BLUE 0.06 % OP SOLN
OPHTHALMIC | Status: DC | PRN
  Administered 2019-10-17: 0.5 mL via INTRAOCULAR

## 2019-10-17 MED ORDER — BRIMONIDINE TARTRATE-TIMOLOL 0.2-0.5 % OP SOLN
OPHTHALMIC | Status: DC | PRN
  Administered 2019-10-17: 1 [drp] via OPHTHALMIC

## 2019-10-17 MED ORDER — ARMC OPHTHALMIC DILATING DROPS
1.0000 "application " | OPHTHALMIC | Status: DC | PRN
  Administered 2019-10-17 (×2): 1 via OPHTHALMIC

## 2019-10-17 MED ORDER — FENTANYL CITRATE (PF) 100 MCG/2ML IJ SOLN
INTRAMUSCULAR | Status: DC | PRN
  Administered 2019-10-17 (×3): 25 ug via INTRAVENOUS

## 2019-10-17 MED ORDER — EPINEPHRINE PF 1 MG/ML IJ SOLN
INTRAOCULAR | Status: DC | PRN
  Administered 2019-10-17: 83 mL via OPHTHALMIC

## 2019-10-17 MED ORDER — CYCLOPENTOLATE HCL 2 % OP SOLN
1.0000 [drp] | OPHTHALMIC | Status: DC | PRN
  Administered 2019-10-17: 1 [drp] via OPHTHALMIC

## 2019-10-17 MED ORDER — PHENYLEPHRINE HCL 10 % OP SOLN
1.0000 [drp] | OPHTHALMIC | Status: DC | PRN
  Administered 2019-10-17: 1 [drp] via OPHTHALMIC

## 2019-10-17 MED ORDER — NEOMYCIN-POLYMYXIN-DEXAMETH 3.5-10000-0.1 OP OINT
TOPICAL_OINTMENT | OPHTHALMIC | Status: DC | PRN
  Administered 2019-10-17: 1 via OPHTHALMIC

## 2019-10-17 MED ORDER — LIDOCAINE HCL (PF) 2 % IJ SOLN
INTRAOCULAR | Status: DC | PRN
  Administered 2019-10-17: 1 mL via INTRAOCULAR

## 2019-10-17 MED ORDER — MOXIFLOXACIN HCL 0.5 % OP SOLN
OPHTHALMIC | Status: DC | PRN
  Administered 2019-10-17: 0.2 mL via OPHTHALMIC

## 2019-10-17 MED ORDER — TETRACAINE HCL 0.5 % OP SOLN
1.0000 [drp] | OPHTHALMIC | Status: DC | PRN
  Administered 2019-10-17 (×3): 1 [drp] via OPHTHALMIC

## 2019-10-17 MED ORDER — ACETAMINOPHEN 160 MG/5ML PO SOLN: 325.0000 mg | ORAL | Status: DC | PRN

## 2019-10-17 MED ORDER — TETRACAINE 0.5 % OP SOLN OPTIME - NO CHARGE
OPHTHALMIC | Status: DC | PRN
  Administered 2019-10-17: 2 [drp] via OPHTHALMIC

## 2019-10-17 MED ORDER — PROVISC 10 MG/ML IO SOLN
INTRAOCULAR | Status: DC | PRN
  Administered 2019-10-17: 0.55 mL via INTRAOCULAR

## 2019-10-17 SURGICAL SUPPLY — 21 items
DISSECTOR HYDRO NUCLEUS 50X22 (MISCELLANEOUS) ×12 IMPLANT
DRSG TEGADERM 2-3/8X2-3/4 SM (GAUZE/BANDAGES/DRESSINGS) ×3 IMPLANT
GLOVE BIOGEL PI IND STRL 8 (GLOVE) ×1 IMPLANT
GLOVE BIOGEL PI INDICATOR 8 (GLOVE) ×2
GOWN STRL REUS W/ TWL LRG LVL3 (GOWN DISPOSABLE) ×1 IMPLANT
GOWN STRL REUS W/ TWL XL LVL3 (GOWN DISPOSABLE) ×1 IMPLANT
GOWN STRL REUS W/TWL LRG LVL3 (GOWN DISPOSABLE) ×3
GOWN STRL REUS W/TWL XL LVL3 (GOWN DISPOSABLE) ×3
KNIFE 45D UP 2.3 (MISCELLANEOUS) ×3 IMPLANT
LENS IOL DIOP 21.5 (Intraocular Lens) ×3 IMPLANT
LENS IOL TECNIS MONO 21.5 (Intraocular Lens) IMPLANT
MARKER SKIN DUAL TIP RULER LAB (MISCELLANEOUS) ×3 IMPLANT
NDL CAPSULORHEX 25GA (NEEDLE) ×1 IMPLANT
NEEDLE CAPSULORHEX 25GA (NEEDLE) ×3 IMPLANT
PACK CATARACT (MISCELLANEOUS) ×3 IMPLANT
PACK DR. KING ARMS (PACKS) ×3 IMPLANT
PACK EYE AFTER SURG (MISCELLANEOUS) ×3 IMPLANT
RING MALYGIN 7.0 (MISCELLANEOUS) ×2 IMPLANT
SOLUTION OPHTHALMIC SALT (MISCELLANEOUS) ×3 IMPLANT
WATER STERILE IRR 250ML POUR (IV SOLUTION) ×3 IMPLANT
WIPE NON LINTING 3.25X3.25 (MISCELLANEOUS) ×3 IMPLANT

## 2019-10-17 NOTE — Anesthesia Postprocedure Evaluation (Signed)
Anesthesia Post Note  Patient: Crystal Newton  Procedure(s) Performed: CATARACT EXTRACTION PHACO AND INTRAOCULAR LENS PLACEMENT (IOC) RIGHT DIABETIC VISION BLUE 9.96  00:59.0 (Right Eye)     Patient location during evaluation: PACU Anesthesia Type: MAC Level of consciousness: awake Pain management: pain level controlled Vital Signs Assessment: post-procedure vital signs reviewed and stable Respiratory status: respiratory function stable Cardiovascular status: stable Postop Assessment: no apparent nausea or vomiting Anesthetic complications: no   No complications documented.  Veda Canning

## 2019-10-17 NOTE — Transfer of Care (Signed)
Immediate Anesthesia Transfer of Care Note  Patient: Crystal Newton  Procedure(s) Performed: CATARACT EXTRACTION PHACO AND INTRAOCULAR LENS PLACEMENT (IOC) RIGHT DIABETIC VISION BLUE 9.96  00:59.0 (Right Eye)  Patient Location: PACU  Anesthesia Type: MAC  Level of Consciousness: awake, alert  and patient cooperative  Airway and Oxygen Therapy: Patient Spontanous Breathing and Patient connected to supplemental oxygen  Post-op Assessment: Post-op Vital signs reviewed, Patient's Cardiovascular Status Stable, Respiratory Function Stable, Patent Airway and No signs of Nausea or vomiting  Post-op Vital Signs: Reviewed and stable  Complications: No complications documented.

## 2019-10-17 NOTE — H&P (Signed)
   I have reviewed the patient's H&P and agree with its findings. There have been no interval changes.  Genese Quebedeaux MD Ophthalmology 

## 2019-10-17 NOTE — Anesthesia Procedure Notes (Signed)
Procedure Name: MAC Date/Time: 10/17/2019 7:39 AM Performed by: Silvana Newness, CRNA Pre-anesthesia Checklist: Patient identified, Emergency Drugs available, Suction available, Patient being monitored and Timeout performed Patient Re-evaluated:Patient Re-evaluated prior to induction Oxygen Delivery Method: Nasal cannula

## 2019-10-17 NOTE — Op Note (Signed)
  PREOPERATIVE DIAGNOSIS:  Nuclear sclerotic cataract of the RIGHT eye.   POSTOPERATIVE DIAGNOSIS:  Nuclear sclerotic cataract of the RIGHT eye with poor pupillary dilation.   OPERATIVE PROCEDURE: Cataract surgery OD with use of malyugin ring   SURGEON:  Crystal Cousin, MD.   ANESTHESIA:  Anesthesiologist: Jola Babinski, MD CRNA: Michaele Offer, CRNA  1.      Managed anesthesia care. 2.     0.58ml of Shugarcaine was instilled following the paracentesis   COMPLICATIONS:  None.   TECHNIQUE:   Divide and conquer   DESCRIPTION OF PROCEDURE:  The patient was examined and consented in the preoperative holding area where the aforementioned topical anesthesia was applied to the RIGHT eye and then brought back to the Operating Room where the RIGHT eye was prepped and draped in the usual sterile ophthalmic fashion and a lid speculum was placed. A paracentesis was created with the side port blade, the anterior chamber was washed out with trypan blue to stain the anterior capsule, and the anterior chamber was filled with viscoelastic. A near clear corneal incision was performed with the steel keratome.   The patient's pupil was dilated to about 5 mm, so a malyugin ring was inserted to expand the pupil.  It was both inserted and removed at the end of the case without complication.  A continuous curvilinear capsulorrhexis was performed with a cystotome followed by the capsulorrhexis forceps. Hydrodissection and hydrodelineation were carried out with BSS on a blunt cannula. The lens was removed in a divide and conquer  technique and the remaining cortical material was removed with the irrigation-aspiration handpiece. The capsular bag was inflated with viscoelastic and the lens was placed in the capsular bag without complication. The remaining viscoelastic was removed from the eye with the irrigation-aspiration handpiece. The wounds were hydrated. The anterior chamber was flushed and the eye was inflated to  physiologic pressure. 0.71ml Vigamox was placed in the anterior chamber. The wounds were found to be water tight. The eye was dressed with Vigamox. The patient was given protective glasses to wear throughout the day and a shield with which to sleep tonight. The patient was also given drops with which to begin a drop regimen today and will follow-up with me in one day. Implant Name Type Inv. Item Serial No. Manufacturer Lot No. LRB No. Used Action  LENS IOL DIOP 21.5 - [id number] Intraocular Lens LENS IOL DIOP 21.5 [id number] [employer]  Right 1 Implanted    Procedure(s) with comments: CATARACT EXTRACTION PHACO AND INTRAOCULAR LENS PLACEMENT (IOC) RIGHT DIABETIC VISION BLUE 9.96  00:59.0 (Right) - Diabetic - diet controlled  Electronically signed: Ryhanna Dunsmore 10/17/2019 8:26 AM

## 2019-10-17 NOTE — Anesthesia Preprocedure Evaluation (Signed)
Anesthesia Evaluation  Patient identified by MRN, date of birth, ID band Patient awake    Airway Mallampati: II  TM Distance: >3 FB     Dental   Pulmonary    breath sounds clear to auscultation       Cardiovascular hypertension,  Rhythm:Regular Rate:Normal     Neuro/Psych  Headaches, PSYCHIATRIC DISORDERS Anxiety Depression Dementia TIA   GI/Hepatic PUD,   Endo/Other  diabetes (diet controlled)  Renal/GU      Musculoskeletal  (+) Arthritis ,   Abdominal   Peds  Hematology   Anesthesia Other Findings   Reproductive/Obstetrics                             Anesthesia Physical Anesthesia Plan  ASA: III  Anesthesia Plan: MAC   Post-op Pain Management:    Induction: Intravenous  PONV Risk Score and Plan: TIVA, Midazolam and Treatment may vary due to age or medical condition  Airway Management Planned: Natural Airway and Nasal Cannula  Additional Equipment:   Intra-op Plan:   Post-operative Plan:   Informed Consent: I have reviewed the patients History and Physical, chart, labs and discussed the procedure including the risks, benefits and alternatives for the proposed anesthesia with the patient or authorized representative who has indicated his/her understanding and acceptance.       Plan Discussed with: CRNA  Anesthesia Plan Comments:         Anesthesia Quick Evaluation

## 2019-10-18 ENCOUNTER — Encounter: Payer: Self-pay | Admitting: Ophthalmology

## 2019-11-06 ENCOUNTER — Other Ambulatory Visit: Payer: Self-pay | Admitting: Family Medicine

## 2019-11-06 NOTE — Telephone Encounter (Signed)
Last office visit 10/15/2019 for CPE.  Last refilled 10/15/2019 for #15 with no refills.  No future appointments with PCP.

## 2019-11-18 ENCOUNTER — Other Ambulatory Visit: Payer: Self-pay | Admitting: Family Medicine

## 2019-11-29 ENCOUNTER — Other Ambulatory Visit: Payer: Self-pay | Admitting: Family Medicine

## 2019-12-01 ENCOUNTER — Other Ambulatory Visit: Payer: Self-pay | Admitting: Family Medicine

## 2019-12-02 NOTE — Telephone Encounter (Signed)
Last office visit 10/15/2019 for CPE.  Last refilled 11/07/2019 for #15 with no refills.  No future appointments with PCP.

## 2019-12-12 DIAGNOSIS — R6889 Other general symptoms and signs: Secondary | ICD-10-CM | POA: Insufficient documentation

## 2019-12-12 DIAGNOSIS — M25511 Pain in right shoulder: Secondary | ICD-10-CM | POA: Insufficient documentation

## 2019-12-12 NOTE — Assessment & Plan Note (Signed)
Will eval with labs for thyroid dysfunction.

## 2019-12-12 NOTE — Assessment & Plan Note (Signed)
Hold lisinopril.. follow BP off of the medication.

## 2019-12-12 NOTE — Assessment & Plan Note (Signed)
Increase namenda to 10 mg BID given worsening of symtpoms.

## 2019-12-12 NOTE — Assessment & Plan Note (Signed)
At goal on simvastatin. 

## 2019-12-12 NOTE — Assessment & Plan Note (Signed)
Diet  Controlled.

## 2019-12-12 NOTE — Assessment & Plan Note (Signed)
Start gentle home PT. Can use meloxicam for pain.

## 2019-12-17 ENCOUNTER — Other Ambulatory Visit: Payer: Self-pay | Admitting: Family Medicine

## 2020-01-02 ENCOUNTER — Other Ambulatory Visit: Payer: Self-pay | Admitting: Family Medicine

## 2020-01-02 DIAGNOSIS — J40 Bronchitis, not specified as acute or chronic: Secondary | ICD-10-CM | POA: Diagnosis not present

## 2020-01-02 DIAGNOSIS — J209 Acute bronchitis, unspecified: Secondary | ICD-10-CM | POA: Diagnosis not present

## 2020-01-07 DIAGNOSIS — R05 Cough: Secondary | ICD-10-CM | POA: Diagnosis not present

## 2020-01-07 DIAGNOSIS — R5383 Other fatigue: Secondary | ICD-10-CM | POA: Diagnosis not present

## 2020-01-07 DIAGNOSIS — K219 Gastro-esophageal reflux disease without esophagitis: Secondary | ICD-10-CM | POA: Diagnosis not present

## 2020-01-10 DIAGNOSIS — D649 Anemia, unspecified: Secondary | ICD-10-CM | POA: Diagnosis not present

## 2020-01-10 DIAGNOSIS — R946 Abnormal results of thyroid function studies: Secondary | ICD-10-CM | POA: Diagnosis not present

## 2020-01-11 ENCOUNTER — Ambulatory Visit (INDEPENDENT_AMBULATORY_CARE_PROVIDER_SITE_OTHER): Payer: Medicare Other

## 2020-01-11 ENCOUNTER — Other Ambulatory Visit: Payer: Self-pay

## 2020-01-11 DIAGNOSIS — Z23 Encounter for immunization: Secondary | ICD-10-CM | POA: Diagnosis not present

## 2020-01-21 ENCOUNTER — Other Ambulatory Visit: Payer: Self-pay | Admitting: Family Medicine

## 2020-02-28 DIAGNOSIS — R946 Abnormal results of thyroid function studies: Secondary | ICD-10-CM | POA: Diagnosis not present

## 2020-04-16 ENCOUNTER — Telehealth: Payer: Self-pay | Admitting: Family Medicine

## 2020-04-16 NOTE — Telephone Encounter (Signed)
Dr. Mila Palmer called in wanted to speak with Dr. Ermalene Searing abut Crystal Newton and wanted to talk about putting her on a medication Prazosin

## 2020-04-23 DIAGNOSIS — E538 Deficiency of other specified B group vitamins: Secondary | ICD-10-CM | POA: Diagnosis not present

## 2020-04-23 DIAGNOSIS — Z79899 Other long term (current) drug therapy: Secondary | ICD-10-CM | POA: Diagnosis not present

## 2020-05-27 ENCOUNTER — Emergency Department
Admission: EM | Admit: 2020-05-27 | Discharge: 2020-05-28 | Disposition: A | Discharge status: Home / Self Care | Payer: Medicare Other | Attending: Emergency Medicine | Admitting: Emergency Medicine

## 2020-05-27 ENCOUNTER — Encounter: Payer: Self-pay | Admitting: *Deleted

## 2020-05-27 ENCOUNTER — Emergency Department: Payer: Medicare Other

## 2020-05-27 ENCOUNTER — Other Ambulatory Visit: Payer: Self-pay

## 2020-05-27 DIAGNOSIS — I10 Essential (primary) hypertension: Secondary | ICD-10-CM | POA: Insufficient documentation

## 2020-05-27 DIAGNOSIS — Z7982 Long term (current) use of aspirin: Secondary | ICD-10-CM | POA: Insufficient documentation

## 2020-05-27 DIAGNOSIS — F0151 Vascular dementia with behavioral disturbance: Secondary | ICD-10-CM | POA: Diagnosis not present

## 2020-05-27 DIAGNOSIS — R079 Chest pain, unspecified: Secondary | ICD-10-CM | POA: Diagnosis not present

## 2020-05-27 DIAGNOSIS — Z8673 Personal history of transient ischemic attack (TIA), and cerebral infarction without residual deficits: Secondary | ICD-10-CM | POA: Insufficient documentation

## 2020-05-27 DIAGNOSIS — R0789 Other chest pain: Secondary | ICD-10-CM | POA: Insufficient documentation

## 2020-05-27 DIAGNOSIS — Z79899 Other long term (current) drug therapy: Secondary | ICD-10-CM | POA: Insufficient documentation

## 2020-05-27 DIAGNOSIS — R21 Rash and other nonspecific skin eruption: Secondary | ICD-10-CM | POA: Diagnosis not present

## 2020-05-27 DIAGNOSIS — E119 Type 2 diabetes mellitus without complications: Secondary | ICD-10-CM | POA: Diagnosis not present

## 2020-05-27 DIAGNOSIS — R072 Precordial pain: Secondary | ICD-10-CM | POA: Diagnosis present

## 2020-05-27 LAB — BASIC METABOLIC PANEL
Anion gap: 11 (ref 5–15)
BUN: 15 mg/dL (ref 8–23)
CO2: 26 mmol/L (ref 22–32)
Calcium: 9.1 mg/dL (ref 8.9–10.3)
Chloride: 105 mmol/L (ref 98–111)
Creatinine, Ser: 0.58 mg/dL (ref 0.44–1.00)
GFR, Estimated: >60 mL/min (ref >60)
Glucose, Bld: 125 mg/dL — ABNORMAL HIGH (ref 70–99)
Potassium: 3.7 mmol/L (ref 3.5–5.1)
Sodium: 142 mmol/L (ref 135–145)

## 2020-05-27 LAB — CBC
HCT: 36.5 % (ref 36.0–46.0)
Hemoglobin: 12.2 g/dL (ref 12.0–15.0)
MCH: 32 pg (ref 26.0–34.0)
MCHC: 33.4 g/dL (ref 30.0–36.0)
MCV: 95.8 fL (ref 80.0–100.0)
Platelets: 213 10*3/uL (ref 150–400)
RBC: 3.81 MIL/uL — ABNORMAL LOW (ref 3.87–5.11)
RDW: 13.4 % (ref 11.5–15.5)
WBC: 8.6 10*3/uL (ref 4.0–10.5)
nRBC: 0 % (ref 0.0–0.2)

## 2020-05-27 LAB — TROPONIN I (HIGH SENSITIVITY): Troponin I (High Sensitivity): 6 ng/L (ref <18)

## 2020-05-27 MED ORDER — PROPRANOLOL HCL 20 MG PO TABS
40.0000 mg | ORAL_TABLET | Freq: Once | ORAL | Status: AC
  Administered 2020-05-28: 40 mg via ORAL
  Filled 2020-05-27: qty 2

## 2020-05-27 NOTE — ED Triage Notes (Signed)
Pt to ED report reporting centralized chest pain this afternoon. Pt was aching in nature, pt denies radiation anywhere. No NVD, lightheadedness or dizziness. No cough but SOB reported.

## 2020-05-27 NOTE — ED Provider Notes (Signed)
[facility] Emergency Department Provider Note  ____________________________________________   Event Date/Time   First MD Initiated Contact with Patient 05/27/20 2338     (approximate)  I have reviewed the triage vital signs and the nursing notes.   HISTORY  Chief Complaint Chest Pain   HPI Crystal Newton is a 75 y.o. female past medical history of anxiety, depression, PUD, DM, HTN, HDL, TIA and dementia who presents accompanied by her husband for assessment of some substernal chest discomfort she has had on and off over the last 10 days or so.  History is supplemented by husband as patient states she simply has a difficult remembering things that per patient and husband she has been having some discomfort in her chest on and off over the last 10 days.  No clear alleviating aggravating or precipitating factors i.e. meals or time of day.  Over patient's husband had the patient walk and get up and walk around when she is experiencing the symptoms seem to help a little bit.  She has not had any headache, earache, sore throat, nausea, vomiting, diarrhea, dysuria, rash, Donnell pain, back pain or recent injuries or falls.  She does not smoke or drink alcohol regularly.  She does not use any illegal drugs.  No known cardiac history.         Past Medical History:  Diagnosis Date  . Anxiety   . Depression   . Diabetes mellitus    diet controlled  . Hyperlipidemia   . Hypertension   . Kidney stones   . Memory difficulties   . TIA (transient ischemic attack)    found on MRI.    Patient Active Problem List   Diagnosis Date Noted  . Cold intolerance 12/12/2019  . Acute pain of right shoulder 12/12/2019  . Acute thoracic back pain 12/14/2018  . Weight loss, unintentional 09/28/2018  . Chronic insomnia 03/14/2017  . Digital mucinous cyst of finger of right hand 09/09/2016  . Bilateral hip bursitis 03/08/2016  . Counseling regarding end of life decision making  02/03/2015  . Dementia, vascular, mixed, with behavioral disturbance (HCC) 06/14/2012  . OSTEOARTHROSIS UNSPEC WHETHER GEN/LOCALIZED HAND 01/14/2008  . Diabetes mellitus with no complication (HCC) 12/21/2006  . Hyperlipidemia LDL goal <100 12/21/2006  . Generalized anxiety disorder 12/21/2006  . MDD (major depressive disorder), recurrent episode, severe (HCC) 12/21/2006  . COMMON MIGRAINE 12/21/2006  . Essential hypertension, benign 12/21/2006  . PUD 12/21/2006    Past Surgical History:  Procedure Laterality Date  . ABDOMINAL HYSTERECTOMY    . BREAST BIOPSY    . CATARACT EXTRACTION W/PHACO Right 10/17/2019   Procedure: CATARACT EXTRACTION PHACO AND INTRAOCULAR LENS PLACEMENT (IOC) RIGHT DIABETIC VISION BLUE 9.96  00:59.0;  Surgeon: Elliot Cousin, MD;  Location: [facility];  Service: Ophthalmology;  Laterality: Right;  Diabetic - diet controlled  . PARTIAL COLECTOMY  1996   ruptured abcess for diverticulitis  . PARTIAL HYSTERECTOMY     uterine prolapse  . right parotid      Prior to Admission medications   Medication Sig Start Date End Date Taking? Authorizing Provider  meloxicam (MOBIC) 15 MG tablet TAKE 1 TABLET BY MOUTH EVERY DAY 12/02/19   Bedsole, Amy E, MD  ASPIRIN 81 PO Take by mouth daily.    [provider]  atorvastatin (LIPITOR) 80 MG tablet TAKE 1 TABLET BY MOUTH EVERY DAY 12/02/19   Bedsole, Amy E, MD  donepezil (ARICEPT) 10 MG tablet TAKE 1 TABLET BY  MOUTH EVERY DAY EVERY NIGHT 11/18/19   Bedsole, Amy E, MD  lisinopril (ZESTRIL) 5 MG tablet TAKE 1 TABLET BY MOUTH EVERY DAY 01/02/20   Bedsole, Amy E, MD  memantine (NAMENDA) 10 MG tablet Take 0.5 tablets (5 mg total) by mouth 2 (two) times daily. 10/15/19   Bedsole, Amy E, MD  Multiple Vitamins-Minerals (CENTRUM SILVER PO) Take 1 tablet by mouth daily.    [provider]  pantoprazole (PROTONIX) 40 MG tablet TAKE 1 TABLET BY MOUTH EVERY DAY 01/21/20   Bedsole, Amy E, MD  propranolol (INDERAL) 40 MG  tablet TAKE 1 TABLET BY MOUTH TWICE A DAY 12/17/19   Bedsole, Amy E, MD  risperiDONE (RISPERDAL) 0.5 MG tablet Take by mouth. 09/30/19   [provider]  TRINTELLIX 20 MG TABS tablet Take 20 mg by mouth daily. 09/21/19   [provider]  vitamin B-12 (CYANOCOBALAMIN) 1000 MCG tablet Take 1,000 mcg by mouth daily.    [provider]  Vitamin D3 (VITAMIN D) 25 MCG tablet Take 1,000 Units by mouth daily.    [provider]    Allergies Patient has no known allergies.  Family History  Problem Relation Age of Onset  . Alzheimer's disease Mother   . Diabetes Maternal Grandfather   . Coronary artery disease Maternal Grandfather     Social History Social History   Tobacco Use  . Smoking status: Never Smoker  . Smokeless tobacco: Never Used  Vaping Use  . Vaping Use: Never used  Substance Use Topics  . Alcohol use: No    Alcohol/week: 0.0 standard drinks  . Drug use: No    Review of Systems  Review of Systems  Constitutional: Negative for chills and fever.  HENT: Negative for sore throat.   Eyes: Negative for pain.  Respiratory: Negative for cough and stridor.   Cardiovascular: Positive for chest pain.  Gastrointestinal: Negative for vomiting.  Genitourinary: Negative for dysuria.  Musculoskeletal: Negative for myalgias.  Skin: Negative for rash.  Neurological: Negative for seizures, loss of consciousness and headaches.  Psychiatric/Behavioral: Positive for memory loss. Negative for suicidal ideas.  All other systems reviewed and are negative.     ____________________________________________   PHYSICAL EXAM:  VITAL SIGNS: ED Triage Vitals  Enc Vitals Group     BP 05/27/20 2051 (!) 174/96     Pulse Rate 05/27/20 2051 76     Resp 05/27/20 2051 17     Temp 05/27/20 2051 98.4 F (36.9 C)     Temp Source 05/27/20 2051 Oral     SpO2 05/27/20 2051 96 %     Weight 05/27/20 2056 136 lb 0.4 oz (61.7 kg)     Height 05/27/20 2056 4\' 11"   (1.499 m)     Head Circumference --      Peak Flow --      Pain Score 05/27/20 2056 4     Pain Loc --      Pain Edu? --      Excl. in GC? --    Vitals:   05/27/20 2051 05/27/20 2343  BP: (!) 174/96 (!) 187/88  Pulse: 76 74  Resp: 17 16  Temp: 98.4 F (36.9 C)   SpO2: 96% 96%   Physical Exam Vitals and nursing note reviewed.  Constitutional:      General: She is not in acute distress.    Appearance: She is well-developed and well-nourished.  HENT:     Head: Normocephalic and atraumatic.  Right Ear: External ear normal.     Left Ear: External ear normal.     Nose: Nose normal.     Mouth/Throat:     Mouth: Mucous membranes are moist.  Eyes:     Conjunctiva/sclera: Conjunctivae normal.  Cardiovascular:     Rate and Rhythm: Normal rate and regular rhythm.     Pulses: Normal pulses.     Heart sounds: No murmur heard.   Pulmonary:     Effort: Pulmonary effort is normal. No respiratory distress.     Breath sounds: Normal breath sounds.  Abdominal:     Palpations: Abdomen is soft.     Tenderness: There is no abdominal tenderness.  Musculoskeletal:        General: No edema.     Cervical back: Neck supple.     Right lower leg: No edema.     Left lower leg: No edema.  Skin:    General: Skin is warm and dry.     Capillary Refill: Capillary refill takes less than 2 seconds.  Neurological:     Mental Status: She is alert and oriented to person, place, and time.  Psychiatric:        Mood and Affect: Mood and affect and mood normal.      ____________________________________________   LABS (all labs ordered are listed, but only abnormal results are displayed)  Labs Reviewed  BASIC METABOLIC PANEL - Abnormal; Notable for the following components:      Result Value   Glucose, Bld 125 (*)    All other components within normal limits  CBC - Abnormal; Notable for the following components:   RBC 3.81 (*)    All other components within normal limits  TROPONIN I (HIGH  SENSITIVITY)  TROPONIN I (HIGH SENSITIVITY)   ____________________________________________  EKG  Sinus rhythm with a ventricular rate of 74, normal axis, unremarkable intervals, nonspecific ST change versus some artifact in V6 without other clear evidence of acute ischemia.  No other significant underlying rhythm. ____________________________________________  RADIOLOGY  ED MD interpretation: No focal consolidation, effusion, edema, pneumothorax or other acute intrathoracic process.  Official radiology report(s): DG Chest 2 View  Result Date: 05/27/2020 CLINICAL DATA:  75 year old female with chest pain. EXAM: CHEST - 2 VIEW COMPARISON:  Chest radiograph dated 06/16/2015. FINDINGS: No focal consolidation, pleural effusion or pneumothorax cardiac silhouette is within limits. Atherosclerotic calcification of the aorta osteopenia with degenerative changes of the spine and scoliosis. No acute osseous pathology. IMPRESSION: No active cardiopulmonary disease. Electronically Signed   By: Elgie Collard M.D.   On: 05/27/2020 21:16    ____________________________________________   PROCEDURES  Procedure(s) performed (including Critical Care):  .1-3 Lead EKG Interpretation Performed by: Gilles Chiquito, MD Authorized by: Gilles Chiquito, MD     Interpretation: normal     ECG rate assessment: normal     Rhythm: sinus rhythm     Ectopy: none     Conduction: normal       ____________________________________________   INITIAL IMPRESSION / ASSESSMENT AND PLAN / ED COURSE      Patient presents with above history exam for assessment of some substernal chest discomfort she has been experiencing on and off over the last 10 days.  On arrival she is hypertensive with a BP of 174/96 otherwise stable vital signs on room air.  On further questioning it seems she did not take her evening dose of propranolol.  This was reordered.  She states her pain currently feels much  better than it did  earlier.  Primary differential includes but is not limited to ACS, arrhythmia, PE, pneumonia, dissection, costochondritis, symptomatic anemia, metabolic derangements, and gastroesophageal reflux disease versus esophageal spasm.  Very low suspicion for PE given patient is not tachycardic, hypoxic and denies any shortness of breath and symptoms seem to improve with exertion.  Also given reassuring EKG with 2 nonelevated troponins and description of symptoms improving with exertion also very low suspicion for ACS at this time.  Chest x-ray shows no evidence of pneumonia or other acute thoracic process.  Low suspicion for dissection as patient has symmetric pulses and mediastinum is neuro.  CBC is unremarkable.  BMP shows no significant lecture light or metabolic derangements.  Suspicion for possible esophageal spasm versus reflux.  Patient does have a PCP appointment tomorrow and I discussed with her that she should continue taking her prescribed Protonix and Pepcid that she may require referral by her PCP to her gastroenterologist but given otherwise stable vitals and reassuring work-up and exam I believe she is safe for discharge with plan for close outpatient follow-up tomorrow and continue evaluation.  Patient has been voiced understanding and agreement with this plan.        ____________________________________________   FINAL CLINICAL IMPRESSION(S) / ED DIAGNOSES  Final diagnoses:  Chest pain, unspecified type    Medications  propranolol (INDERAL) tablet 40 mg (40 mg Oral Given 05/28/20 0001)     ED Discharge Orders    None       Note:  This document was prepared using Dragon voice recognition software and may include unintentional dictation errors.   Gilles Chiquito, MD 05/28/20 [phone]

## 2020-05-28 ENCOUNTER — Encounter: Payer: Self-pay | Admitting: *Deleted

## 2020-05-28 ENCOUNTER — Other Ambulatory Visit: Payer: Self-pay

## 2020-05-28 ENCOUNTER — Emergency Department: Payer: Medicare Other

## 2020-05-28 ENCOUNTER — Emergency Department
Admission: EM | Admit: 2020-05-28 | Discharge: 2020-05-28 | Disposition: A | Discharge status: Home / Self Care | Payer: Medicare Other | Source: Home / Self Care | Attending: Emergency Medicine | Admitting: Emergency Medicine

## 2020-05-28 DIAGNOSIS — K224 Dyskinesia of esophagus: Secondary | ICD-10-CM | POA: Diagnosis not present

## 2020-05-28 DIAGNOSIS — F0151 Vascular dementia with behavioral disturbance: Secondary | ICD-10-CM | POA: Insufficient documentation

## 2020-05-28 DIAGNOSIS — Z79899 Other long term (current) drug therapy: Secondary | ICD-10-CM | POA: Insufficient documentation

## 2020-05-28 DIAGNOSIS — R0789 Other chest pain: Secondary | ICD-10-CM | POA: Insufficient documentation

## 2020-05-28 DIAGNOSIS — Z7982 Long term (current) use of aspirin: Secondary | ICD-10-CM | POA: Insufficient documentation

## 2020-05-28 DIAGNOSIS — Z8673 Personal history of transient ischemic attack (TIA), and cerebral infarction without residual deficits: Secondary | ICD-10-CM | POA: Insufficient documentation

## 2020-05-28 DIAGNOSIS — I10 Essential (primary) hypertension: Secondary | ICD-10-CM | POA: Insufficient documentation

## 2020-05-28 DIAGNOSIS — Z0000 Encounter for general adult medical examination without abnormal findings: Secondary | ICD-10-CM | POA: Diagnosis not present

## 2020-05-28 DIAGNOSIS — E785 Hyperlipidemia, unspecified: Secondary | ICD-10-CM | POA: Diagnosis not present

## 2020-05-28 DIAGNOSIS — R21 Rash and other nonspecific skin eruption: Secondary | ICD-10-CM | POA: Diagnosis not present

## 2020-05-28 DIAGNOSIS — K219 Gastro-esophageal reflux disease without esophagitis: Secondary | ICD-10-CM | POA: Diagnosis not present

## 2020-05-28 DIAGNOSIS — E119 Type 2 diabetes mellitus without complications: Secondary | ICD-10-CM | POA: Insufficient documentation

## 2020-05-28 DIAGNOSIS — R079 Chest pain, unspecified: Secondary | ICD-10-CM | POA: Diagnosis not present

## 2020-05-28 DIAGNOSIS — Z780 Asymptomatic menopausal state: Secondary | ICD-10-CM | POA: Diagnosis not present

## 2020-05-28 LAB — CBC
HCT: 36.1 % (ref 36.0–46.0)
Hemoglobin: 11.7 g/dL — ABNORMAL LOW (ref 12.0–15.0)
MCH: 31.4 pg (ref 26.0–34.0)
MCHC: 32.4 g/dL (ref 30.0–36.0)
MCV: 96.8 fL (ref 80.0–100.0)
Platelets: 215 10*3/uL (ref 150–400)
RBC: 3.73 MIL/uL — ABNORMAL LOW (ref 3.87–5.11)
RDW: 13.4 % (ref 11.5–15.5)
WBC: 8.7 10*3/uL (ref 4.0–10.5)
nRBC: 0 % (ref 0.0–0.2)

## 2020-05-28 LAB — BASIC METABOLIC PANEL
Anion gap: 10 (ref 5–15)
BUN: 15 mg/dL (ref 8–23)
CO2: 25 mmol/L (ref 22–32)
Calcium: 9.3 mg/dL (ref 8.9–10.3)
Chloride: 106 mmol/L (ref 98–111)
Creatinine, Ser: 0.59 mg/dL (ref 0.44–1.00)
GFR, Estimated: >60 mL/min (ref >60)
Glucose, Bld: 122 mg/dL — ABNORMAL HIGH (ref 70–99)
Potassium: 3.6 mmol/L (ref 3.5–5.1)
Sodium: 141 mmol/L (ref 135–145)

## 2020-05-28 LAB — TROPONIN I (HIGH SENSITIVITY)
Troponin I (High Sensitivity): 7 ng/L (ref <18)
Troponin I (High Sensitivity): 8 ng/L (ref <18)
Troponin I (High Sensitivity): 8 ng/L (ref <18)

## 2020-05-28 MED ORDER — NITROGLYCERIN 0.4 MG SL SUBL
0.4000 mg | SUBLINGUAL_TABLET | SUBLINGUAL | Status: DC | PRN
  Administered 2020-05-28: 0.4 mg via SUBLINGUAL
  Filled 2020-05-28: qty 1

## 2020-05-28 MED ORDER — NITROGLYCERIN 0.3 MG SL SUBL: 0.3000 mg | SUBLINGUAL_TABLET | SUBLINGUAL | 0 refills | Status: AC | PRN

## 2020-05-28 NOTE — Discharge Instructions (Addendum)
Call Dr. Ermalene Searing to discuss your ER visit and medication changes  Follow-up with GI as discussed  Continue your PANTOPRAZOLE daily - this is an antacid and can help with esophageal spasms

## 2020-05-28 NOTE — ED Notes (Signed)
Pt in bathroom at this time. Will call pt again shortly.

## 2020-05-28 NOTE — ED Triage Notes (Addendum)
Pt comes with husband with c/o CP. Pt was here last night in ED for same and evaluated. Pt states her PCP saw her today and advised her to come back to ED.

## 2020-05-28 NOTE — ED Triage Notes (Signed)
Pt ambulatory to triage.  Pt reports chest tightness.  Pt was seen in the er yesterday with similar sx.  Pt saw her doctor today and was told to come back to er for eval.  No sob.  Nausea today.  Nonsmoker.  Pt alert  Speech clear.

## 2020-05-28 NOTE — ED Provider Notes (Addendum)
[facility] Emergency Department Provider Note  ____________________________________________   Event Date/Time   First MD Initiated Contact with Patient 05/28/20 1912     (approximate)  I have reviewed the triage vital signs and the nursing notes.   HISTORY  Chief Complaint Chest Pain    HPI Crystal Newton is a 75 y.o. female here with chest pain.  The patient was just seen yesterday for similar complaints.  She has dementia, so majority of history is obtained by her husband.  She reportedly has had increasingly frequent episodes of spasm-like, squeezing pain in her chest.  This seems to come and go randomly, and last anywhere from several minutes to hours at a time.  It is been increasingly frequent.  She feels like it radiates from her epigastric area up towards her throat.  She has seen her PCP had recently had a fairly negative cardiac work-up for this.  She is being referred to GI.  Symptoms do not necessarily occur when she is eating, but have occurred with eating in the past.  No fevers or chills.  No known history of esophageal issues but reportedly has a history of peptic ulcers.  Remainder of history limited due to mild dementia.        Past Medical History:  Diagnosis Date  . Anxiety   . Depression   . Diabetes mellitus    diet controlled  . Hyperlipidemia   . Hypertension   . Kidney stones   . Memory difficulties   . TIA (transient ischemic attack)    found on MRI.    Patient Active Problem List   Diagnosis Date Noted  . Cold intolerance 12/12/2019  . Acute pain of right shoulder 12/12/2019  . Acute thoracic back pain 12/14/2018  . Weight loss, unintentional 09/28/2018  . Chronic insomnia 03/14/2017  . Digital mucinous cyst of finger of right hand 09/09/2016  . Bilateral hip bursitis 03/08/2016  . Counseling regarding end of life decision making 02/03/2015  . Dementia, vascular, mixed, with behavioral disturbance (HCC) 06/14/2012  .  OSTEOARTHROSIS UNSPEC WHETHER GEN/LOCALIZED HAND 01/14/2008  . Diabetes mellitus with no complication (HCC) 12/21/2006  . Hyperlipidemia LDL goal <100 12/21/2006  . Generalized anxiety disorder 12/21/2006  . MDD (major depressive disorder), recurrent episode, severe (HCC) 12/21/2006  . COMMON MIGRAINE 12/21/2006  . Essential hypertension, benign 12/21/2006  . PUD 12/21/2006    Past Surgical History:  Procedure Laterality Date  . ABDOMINAL HYSTERECTOMY    . BREAST BIOPSY    . CATARACT EXTRACTION W/PHACO Right 10/17/2019   Procedure: CATARACT EXTRACTION PHACO AND INTRAOCULAR LENS PLACEMENT (IOC) RIGHT DIABETIC VISION BLUE 9.96  00:59.0;  Surgeon: Elliot Cousin, MD;  Location: [facility];  Service: Ophthalmology;  Laterality: Right;  Diabetic - diet controlled  . PARTIAL COLECTOMY  1996   ruptured abcess for diverticulitis  . PARTIAL HYSTERECTOMY     uterine prolapse  . right parotid      Prior to Admission medications   Medication Sig Start Date End Date Taking? Authorizing Provider  meloxicam (MOBIC) 15 MG tablet TAKE 1 TABLET BY MOUTH EVERY DAY 12/02/19   Bedsole, Amy E, MD  nitroGLYCERIN (NITROSTAT) 0.3 MG SL tablet Place 1 tablet (0.3 mg total) under the tongue every 5 (five) minutes as needed for chest pain (call your doctor if pain persists after 3 doses). 05/28/20 05/28/21 Yes Shaune Pollack, MD  ASPIRIN 81 PO Take by mouth daily.    [provider]  atorvastatin (  LIPITOR) 80 MG tablet TAKE 1 TABLET BY MOUTH EVERY DAY 12/02/19   Bedsole, Amy E, MD  donepezil (ARICEPT) 10 MG tablet TAKE 1 TABLET BY MOUTH EVERY DAY EVERY NIGHT 11/18/19   Bedsole, Amy E, MD  lisinopril (ZESTRIL) 5 MG tablet TAKE 1 TABLET BY MOUTH EVERY DAY 01/02/20   Bedsole, Amy E, MD  memantine (NAMENDA) 10 MG tablet Take 0.5 tablets (5 mg total) by mouth 2 (two) times daily. 10/15/19   Bedsole, Amy E, MD  Multiple Vitamins-Minerals (CENTRUM SILVER PO) Take 1 tablet by mouth daily.    [provider]  pantoprazole (PROTONIX) 40 MG tablet TAKE 1 TABLET BY MOUTH EVERY DAY 01/21/20   Bedsole, Amy E, MD  propranolol (INDERAL) 40 MG tablet TAKE 1 TABLET BY MOUTH TWICE A DAY 12/17/19   Bedsole, Amy E, MD  risperiDONE (RISPERDAL) 0.5 MG tablet Take by mouth. 09/30/19   [provider]  TRINTELLIX 20 MG TABS tablet Take 20 mg by mouth daily. 09/21/19   [provider]  vitamin B-12 (CYANOCOBALAMIN) 1000 MCG tablet Take 1,000 mcg by mouth daily.    [provider]  Vitamin D3 (VITAMIN D) 25 MCG tablet Take 1,000 Units by mouth daily.    [provider]    Allergies Patient has no known allergies.  Family History  Problem Relation Age of Onset  . Alzheimer's disease Mother   . Diabetes Maternal Grandfather   . Coronary artery disease Maternal Grandfather     Social History Social History   Tobacco Use  . Smoking status: Never Smoker  . Smokeless tobacco: Never Used  Vaping Use  . Vaping Use: Never used  Substance Use Topics  . Alcohol use: No    Alcohol/week: 0.0 standard drinks  . Drug use: No    Review of Systems  Review of Systems  Constitutional: Positive for fatigue. Negative for fever.  HENT: Negative for congestion and sore throat.   Eyes: Negative for visual disturbance.  Respiratory: Positive for chest tightness. Negative for cough and shortness of breath.   Cardiovascular: Positive for chest pain.  Gastrointestinal: Negative for abdominal pain, diarrhea, nausea and vomiting.  Genitourinary: Negative for flank pain.  Musculoskeletal: Negative for back pain and neck pain.  Skin: Negative for rash and wound.  Neurological: Negative for weakness.  All other systems reviewed and are negative.    ____________________________________________  PHYSICAL EXAM:      VITAL SIGNS: ED Triage Vitals  Enc Vitals Group     BP 05/28/20 1847 (!) 170/88     Pulse Rate 05/28/20 1847 76     Resp 05/28/20 1847 16     Temp 05/28/20  1847 98.4 F (36.9 C)     Temp Source 05/28/20 1847 Oral     SpO2 --      Weight 05/28/20 1840 139 lb (63 kg)     Height 05/28/20 1840 5\' 3"  (1.6 m)     Head Circumference --      Peak Flow --      Pain Score 05/28/20 1840 7     Pain Loc --      Pain Edu? --      Excl. in GC? --      Physical Exam Vitals and nursing note reviewed.  Constitutional:      General: She is not in acute distress.    Appearance: She is well-developed.  HENT:     Head: Normocephalic and atraumatic.  Eyes:  Conjunctiva/sclera: Conjunctivae normal.  Cardiovascular:     Rate and Rhythm: Normal rate and regular rhythm.     Heart sounds: Normal heart sounds. No murmur heard. No friction rub.  Pulmonary:     Effort: Pulmonary effort is normal. No respiratory distress.     Breath sounds: Normal breath sounds. No wheezing or rales.  Abdominal:     General: There is no distension.     Palpations: Abdomen is soft.     Tenderness: There is no abdominal tenderness.  Musculoskeletal:     Cervical back: Neck supple.  Skin:    General: Skin is warm.     Capillary Refill: Capillary refill takes less than 2 seconds.  Neurological:     Mental Status: She is alert and oriented to person, place, and time.     Motor: No abnormal muscle tone.       ____________________________________________   LABS (all labs ordered are listed, but only abnormal results are displayed)  Labs Reviewed  BASIC METABOLIC PANEL - Abnormal; Notable for the following components:      Result Value   Glucose, Bld 122 (*)    All other components within normal limits  CBC - Abnormal; Notable for the following components:   RBC 3.73 (*)    Hemoglobin 11.7 (*)    All other components within normal limits  TROPONIN I (HIGH SENSITIVITY)  TROPONIN I (HIGH SENSITIVITY)    ____________________________________________  EKG: Normal sinus rhythm, ventricular rate 78.  PR 174, QRS 70, QTc 412.  No acute ST elevations or  depressions.  No KG evidence of acute ischemia or infarct. ________________________________________  RADIOLOGY All imaging, including plain films, CT scans, and ultrasounds, independently reviewed by me, and interpretations confirmed via formal radiology reads.  ED MD interpretation:   Chest x-ray: No acute disease   Official radiology report(s): DG Chest 2 View  Result Date: 05/28/2020 CLINICAL DATA:  75 year old female with chest pain. EXAM: CHEST - 2 VIEW COMPARISON:  Chest radiograph dated 05/27/2020 FINDINGS: No focal consolidation, pleural effusion or pneumothorax. The cardiac silhouette is within limits. Atherosclerotic calcification of the aorta. The aorta is tortuous. Osteopenia with degenerative changes of the spine and scoliosis. No acute osseous pathology. IMPRESSION: No active cardiopulmonary disease. Electronically Signed   By: Elgie Collard M.D.   On: 05/28/2020 19:12    ____________________________________________  PROCEDURES   Procedure(s) performed (including Critical Care):  Procedures  ____________________________________________  INITIAL IMPRESSION / MDM / ASSESSMENT AND PLAN / ED COURSE  As part of my medical decision making, I reviewed the following data within the electronic MEDICAL RECORD NUMBER Nursing notes reviewed and incorporated, Old chart reviewed, Notes from prior ED visits, and [place] Controlled Substance Database       *Crystal Newton was evaluated in Emergency Department on 05/29/2020 for the symptoms described in the history of present illness. She was evaluated in the context of the global COVID-19 pandemic, which necessitated consideration that the patient might be at risk for infection with the SARS-CoV-2 virus that causes COVID-19. Institutional protocols and algorithms that pertain to the evaluation of patients at risk for COVID-19 are in a state of rapid change based on information released by regulatory bodies including the CDC and federal and  state organizations. These policies and algorithms were followed during the patient's care in the ED.  Some ED evaluations and interventions may be delayed as a result of limited staffing during the pandemic.*     Medical Decision Making:  75  yo F here with chest pain. Pt was just seen, evaluated, and discharged in ED yesterday for similar complaints. Sx do seem concerning for possible esophageal spasm, though anxiety is also a consideration. EKG is nonischemic and trop neg x 2 today - doubt ACS. BMP unremarkable. CBC at baseline. CXR is clear. Of note, pt had recurrence of pain while in ED. Given concern for possible esophageal spasm, nitro given with moderate relief. Feel it's reasonable to d/c with nitro PRN at home as she tolerated it very well here, it improved her sx (again, do not suspect angina in setting of atypical presentation and neg cardiac w/u x 2), and will give her a PRN med to alleviate sx until she can f/u with GI for further evaluation. Husband was instructed on risks/benefits of nitro and to hold if SBP<120, and to not administer more than 1-2 doses before seeking medical attention. ____________________________________________  FINAL CLINICAL IMPRESSION(S) / ED DIAGNOSES  Final diagnoses:  Atypical chest pain     MEDICATIONS GIVEN DURING THIS VISIT:  Medications  nitroGLYCERIN (NITROSTAT) SL tablet 0.4 mg (0.4 mg Sublingual Given 05/28/20 2100)     ED Discharge Orders         Ordered    nitroGLYCERIN (NITROSTAT) 0.3 MG SL tablet  Every 5 min PRN        05/28/20 2220           Note:  This document was prepared using Dragon voice recognition software and may include unintentional dictation errors.   Shaune Pollack, MD 05/29/20 Kathrynn Humble    Shaune Pollack, MD 05/29/20 [phone]

## 2020-05-29 ENCOUNTER — Telehealth: Payer: Self-pay | Admitting: Gastroenterology

## 2020-05-29 NOTE — Telephone Encounter (Signed)
Crystal Spillers, MD  Zack Seal, Amaziah Ghosh L Please set up clinic appointment with me on Monday in [place]. The ER called me about this patient and she needs to be seen. Ok to [employer] for Monday. I see she has an appt with [facility], if she prefers to keep that, that's fine. But since ER called me about her, we need to call her and offer her an appt. If she is agreeable to seeing [place] Monday, she doesn't need the appt with [facility]     Spoke w/ patient and she wants to stay with [facility].

## 2020-06-01 DIAGNOSIS — R0789 Other chest pain: Secondary | ICD-10-CM | POA: Diagnosis not present

## 2020-06-01 DIAGNOSIS — K219 Gastro-esophageal reflux disease without esophagitis: Secondary | ICD-10-CM | POA: Diagnosis not present

## 2020-06-03 ENCOUNTER — Other Ambulatory Visit: Payer: Self-pay | Admitting: Gastroenterology

## 2020-06-03 DIAGNOSIS — K219 Gastro-esophageal reflux disease without esophagitis: Secondary | ICD-10-CM

## 2020-06-03 DIAGNOSIS — R0789 Other chest pain: Secondary | ICD-10-CM

## 2020-06-04 ENCOUNTER — Ambulatory Visit
Admission: RE | Admit: 2020-06-04 | Discharge: 2020-06-04 | Disposition: A | Discharge status: Home / Self Care | Payer: Medicare Other | Source: Ambulatory Visit | Attending: Gastroenterology | Admitting: Gastroenterology

## 2020-06-04 ENCOUNTER — Other Ambulatory Visit: Payer: Self-pay

## 2020-06-04 DIAGNOSIS — K219 Gastro-esophageal reflux disease without esophagitis: Secondary | ICD-10-CM | POA: Insufficient documentation

## 2020-06-04 DIAGNOSIS — R0789 Other chest pain: Secondary | ICD-10-CM | POA: Diagnosis not present

## 2020-06-08 DIAGNOSIS — M810 Age-related osteoporosis without current pathological fracture: Secondary | ICD-10-CM | POA: Diagnosis not present

## 2020-06-13 ENCOUNTER — Other Ambulatory Visit: Payer: Self-pay | Admitting: Family Medicine

## 2020-06-22 DIAGNOSIS — Z20822 Contact with and (suspected) exposure to covid-19: Secondary | ICD-10-CM | POA: Diagnosis not present

## 2020-06-22 DIAGNOSIS — R059 Cough, unspecified: Secondary | ICD-10-CM | POA: Diagnosis not present

## 2020-06-23 DIAGNOSIS — K219 Gastro-esophageal reflux disease without esophagitis: Secondary | ICD-10-CM | POA: Diagnosis not present

## 2020-06-23 DIAGNOSIS — R5383 Other fatigue: Secondary | ICD-10-CM | POA: Diagnosis not present

## 2020-06-23 DIAGNOSIS — E785 Hyperlipidemia, unspecified: Secondary | ICD-10-CM | POA: Diagnosis not present

## 2020-06-23 DIAGNOSIS — K224 Dyskinesia of esophagus: Secondary | ICD-10-CM | POA: Diagnosis not present

## 2020-06-23 DIAGNOSIS — E119 Type 2 diabetes mellitus without complications: Secondary | ICD-10-CM | POA: Diagnosis not present

## 2020-06-23 DIAGNOSIS — I10 Essential (primary) hypertension: Secondary | ICD-10-CM | POA: Diagnosis not present

## 2020-06-24 DIAGNOSIS — R634 Abnormal weight loss: Secondary | ICD-10-CM | POA: Diagnosis not present

## 2020-06-24 DIAGNOSIS — R0789 Other chest pain: Secondary | ICD-10-CM | POA: Diagnosis not present

## 2020-06-24 DIAGNOSIS — R198 Other specified symptoms and signs involving the digestive system and abdomen: Secondary | ICD-10-CM | POA: Diagnosis not present

## 2020-07-03 DIAGNOSIS — R198 Other specified symptoms and signs involving the digestive system and abdomen: Secondary | ICD-10-CM | POA: Diagnosis not present

## 2020-07-03 DIAGNOSIS — R0789 Other chest pain: Secondary | ICD-10-CM | POA: Diagnosis not present

## 2020-07-04 ENCOUNTER — Emergency Department
Admission: EM | Admit: 2020-07-04 | Discharge: 2020-07-04 | Disposition: A | Discharge status: Home / Self Care | Payer: Medicare Other | Attending: Emergency Medicine | Admitting: Emergency Medicine

## 2020-07-04 ENCOUNTER — Emergency Department: Payer: Medicare Other

## 2020-07-04 ENCOUNTER — Other Ambulatory Visit: Payer: Self-pay

## 2020-07-04 DIAGNOSIS — I10 Essential (primary) hypertension: Secondary | ICD-10-CM | POA: Diagnosis not present

## 2020-07-04 DIAGNOSIS — Z8673 Personal history of transient ischemic attack (TIA), and cerebral infarction without residual deficits: Secondary | ICD-10-CM | POA: Diagnosis not present

## 2020-07-04 DIAGNOSIS — R110 Nausea: Secondary | ICD-10-CM | POA: Insufficient documentation

## 2020-07-04 DIAGNOSIS — E119 Type 2 diabetes mellitus without complications: Secondary | ICD-10-CM | POA: Insufficient documentation

## 2020-07-04 DIAGNOSIS — Z79899 Other long term (current) drug therapy: Secondary | ICD-10-CM | POA: Insufficient documentation

## 2020-07-04 DIAGNOSIS — F0151 Vascular dementia with behavioral disturbance: Secondary | ICD-10-CM | POA: Insufficient documentation

## 2020-07-04 DIAGNOSIS — R059 Cough, unspecified: Secondary | ICD-10-CM | POA: Insufficient documentation

## 2020-07-04 DIAGNOSIS — Z7982 Long term (current) use of aspirin: Secondary | ICD-10-CM | POA: Diagnosis not present

## 2020-07-04 DIAGNOSIS — R079 Chest pain, unspecified: Secondary | ICD-10-CM | POA: Diagnosis not present

## 2020-07-04 DIAGNOSIS — R0789 Other chest pain: Secondary | ICD-10-CM | POA: Diagnosis not present

## 2020-07-04 LAB — BASIC METABOLIC PANEL
Anion gap: 9 (ref 5–15)
BUN: 10 mg/dL (ref 8–23)
CO2: 25 mmol/L (ref 22–32)
Calcium: 8.8 mg/dL — ABNORMAL LOW (ref 8.9–10.3)
Chloride: 105 mmol/L (ref 98–111)
Creatinine, Ser: 0.52 mg/dL (ref 0.44–1.00)
GFR, Estimated: >60 mL/min (ref >60)
Glucose, Bld: 121 mg/dL — ABNORMAL HIGH (ref 70–99)
Potassium: 3.5 mmol/L (ref 3.5–5.1)
Sodium: 139 mmol/L (ref 135–145)

## 2020-07-04 LAB — CBC
HCT: 33.4 % — ABNORMAL LOW (ref 36.0–46.0)
Hemoglobin: 10.7 g/dL — ABNORMAL LOW (ref 12.0–15.0)
MCH: 31.4 pg (ref 26.0–34.0)
MCHC: 32 g/dL (ref 30.0–36.0)
MCV: 97.9 fL (ref 80.0–100.0)
Platelets: 276 10*3/uL (ref 150–400)
RBC: 3.41 MIL/uL — ABNORMAL LOW (ref 3.87–5.11)
RDW: 12.9 % (ref 11.5–15.5)
WBC: 10.1 10*3/uL (ref 4.0–10.5)
nRBC: 0 % (ref 0.0–0.2)

## 2020-07-04 LAB — TROPONIN I (HIGH SENSITIVITY): Troponin I (High Sensitivity): 7 ng/L (ref <18)

## 2020-07-04 NOTE — ED Triage Notes (Signed)
Pt to ED via POV for chest pressure. Pt husband states that pt has been diagnosed with Globus sensation, anxiety, and esophagus spasms in the past. Pt is on medication for this but husband states that when she has the attacks that she feels like she cannot get her breath. Pt husband states that pt was seen twice in February for the same thing. Pt does not appear to be in any distress at this time. Vital signs are stable.

## 2020-07-04 NOTE — ED Provider Notes (Signed)
[facility] Emergency Department Provider Note  ____________________________________________   Event Date/Time   First MD Initiated Contact with Patient 07/04/20 2106     (approximate)  I have reviewed the triage vital signs and the nursing notes.   HISTORY  Chief Complaint Chest Pain   HPI Crystal Newton is a 75 y.o. female with a past medical history of dementia, anxiety, depression, DM, HTN, HDL and TIA as well as recent diagnosis of globus sensation for some fullness she has had in her chest on and off over the last couple weeks as well as recent diagnosis of esophageal spasm who presents accompanied by her husband for assessment of some chest discomfort she had earlier today.  Patient notes she has had some coughing over the last month or so as well as intermittent sense of chest pain was told that this is related to esophageal spasm and possible globus sensation.  She states she is not currently in any pain.  She states that she has little bit of nausea now for the last couple days but none at the moment and has not been vomiting.  She denies any headache, earache, sore throat, abdominal pain, back pain, shortness of breath, rash extremity pain.  No recent falls or injuries.  No other acute concerns at this time.  She has never been diagnosed with reflux.          Past Medical History:  Diagnosis Date  . Anxiety   . Depression   . Diabetes mellitus    diet controlled  . Hyperlipidemia   . Hypertension   . Kidney stones   . Memory difficulties   . TIA (transient ischemic attack)    found on MRI.    Patient Active Problem List   Diagnosis Date Noted  . Cold intolerance 12/12/2019  . Acute pain of right shoulder 12/12/2019  . Acute thoracic back pain 12/14/2018  . Weight loss, unintentional 09/28/2018  . Chronic insomnia 03/14/2017  . Digital mucinous cyst of finger of right hand 09/09/2016  . Bilateral hip bursitis 03/08/2016  . Counseling  regarding end of life decision making 02/03/2015  . Dementia, vascular, mixed, with behavioral disturbance (HCC) 06/14/2012  . OSTEOARTHROSIS UNSPEC WHETHER GEN/LOCALIZED HAND 01/14/2008  . Diabetes mellitus with no complication (HCC) 12/21/2006  . Hyperlipidemia LDL goal <100 12/21/2006  . Generalized anxiety disorder 12/21/2006  . MDD (major depressive disorder), recurrent episode, severe (HCC) 12/21/2006  . COMMON MIGRAINE 12/21/2006  . Essential hypertension, benign 12/21/2006  . PUD 12/21/2006    Past Surgical History:  Procedure Laterality Date  . ABDOMINAL HYSTERECTOMY    . BREAST BIOPSY    . CATARACT EXTRACTION W/PHACO Right 10/17/2019   Procedure: CATARACT EXTRACTION PHACO AND INTRAOCULAR LENS PLACEMENT (IOC) RIGHT DIABETIC VISION BLUE 9.96  00:59.0;  Surgeon: Elliot Cousin, MD;  Location: [facility];  Service: Ophthalmology;  Laterality: Right;  Diabetic - diet controlled  . PARTIAL COLECTOMY  1996   ruptured abcess for diverticulitis  . PARTIAL HYSTERECTOMY     uterine prolapse  . right parotid      Prior to Admission medications   Medication Sig Start Date End Date Taking? Authorizing Provider  meloxicam (MOBIC) 15 MG tablet TAKE 1 TABLET BY MOUTH EVERY DAY 12/02/19   Bedsole, Amy E, MD  ASPIRIN 81 PO Take by mouth daily.    [provider]  atorvastatin (LIPITOR) 80 MG tablet TAKE 1 TABLET BY MOUTH EVERY DAY 12/02/19   Ermalene Searing, Amy  E, MD  donepezil (ARICEPT) 10 MG tablet TAKE 1 TABLET BY MOUTH EVERY DAY EVERY NIGHT 11/18/19   Bedsole, Amy E, MD  lisinopril (ZESTRIL) 5 MG tablet TAKE 1 TABLET BY MOUTH EVERY DAY 01/02/20   Bedsole, Amy E, MD  memantine (NAMENDA) 10 MG tablet Take 0.5 tablets (5 mg total) by mouth 2 (two) times daily. 10/15/19   Bedsole, Amy E, MD  Multiple Vitamins-Minerals (CENTRUM SILVER PO) Take 1 tablet by mouth daily.    [provider]  nitroGLYCERIN (NITROSTAT) 0.3 MG SL tablet Place 1 tablet (0.3 mg total) under the tongue every  5 (five) minutes as needed for chest pain (call your doctor if pain persists after 3 doses). 05/28/20 05/28/21  Shaune PollackIsaacs, Cameron, MD  pantoprazole (PROTONIX) 40 MG tablet TAKE 1 TABLET BY MOUTH EVERY DAY 01/21/20   Bedsole, Amy E, MD  propranolol (INDERAL) 40 MG tablet TAKE 1 TABLET BY MOUTH TWICE A DAY 06/15/20   Bedsole, Amy E, MD  risperiDONE (RISPERDAL) 0.5 MG tablet Take by mouth. 09/30/19   [provider]  TRINTELLIX 20 MG TABS tablet Take 20 mg by mouth daily. 09/21/19   [provider]  vitamin B-12 (CYANOCOBALAMIN) 1000 MCG tablet Take 1,000 mcg by mouth daily.    [provider]  Vitamin D3 (VITAMIN D) 25 MCG tablet Take 1,000 Units by mouth daily.    [provider]    Allergies Patient has no known allergies.  Family History  Problem Relation Age of Onset  . Alzheimer's disease Mother   . Diabetes Maternal Grandfather   . Coronary artery disease Maternal Grandfather     Social History Social History   Tobacco Use  . Smoking status: Never Smoker  . Smokeless tobacco: Never Used  Vaping Use  . Vaping Use: Never used  Substance Use Topics  . Alcohol use: No    Alcohol/week: 0.0 standard drinks  . Drug use: No    Review of Systems  Review of Systems  Constitutional: Negative for chills and fever.  HENT: Negative for sore throat.   Eyes: Negative for pain.  Respiratory: Positive for cough. Negative for stridor.   Cardiovascular: Positive for chest pain.  Gastrointestinal: Negative for vomiting.  Genitourinary: Negative for dysuria.  Musculoskeletal: Negative for myalgias.  Skin: Negative for rash.  Neurological: Negative for seizures, loss of consciousness and headaches.  Psychiatric/Behavioral: Negative for suicidal ideas.  All other systems reviewed and are negative.     ____________________________________________   PHYSICAL EXAM:  VITAL SIGNS: ED Triage Vitals  Enc Vitals Group     BP 07/04/20 1800 (!) 128/55      Pulse Rate 07/04/20 1800 79     Resp 07/04/20 1800 16     Temp 07/04/20 1800 98.3 F (36.8 C)     Temp Source 07/04/20 1800 Oral     SpO2 07/04/20 1800 96 %     Weight 07/04/20 1801 124 lb 9.6 oz (56.5 kg)     Height 07/04/20 1801 5' (1.524 m)     Head Circumference --      Peak Flow --      Pain Score --      Pain Loc --      Pain Edu? --      Excl. in GC? --    Vitals:   07/04/20 2125 07/04/20 2130  BP: 131/75 131/75  Pulse: 85 88  Resp: 17 17  Temp: 98.1 F (36.7 C)   SpO2:  96%  Physical Exam Vitals and nursing note reviewed.  Constitutional:      General: She is not in acute distress.    Appearance: She is well-developed.  HENT:     Head: Normocephalic and atraumatic.     Right Ear: External ear normal.     Left Ear: External ear normal.     Nose: Nose normal.     Mouth/Throat:     Mouth: Mucous membranes are moist.  Eyes:     Conjunctiva/sclera: Conjunctivae normal.  Cardiovascular:     Rate and Rhythm: Normal rate and regular rhythm.     Heart sounds: No murmur heard.   Pulmonary:     Effort: Pulmonary effort is normal. No respiratory distress.     Breath sounds: Normal breath sounds.  Abdominal:     Palpations: Abdomen is soft.     Tenderness: There is no abdominal tenderness.  Musculoskeletal:     Cervical back: Neck supple.  Skin:    General: Skin is warm and dry.     Capillary Refill: Capillary refill takes less than 2 seconds.  Neurological:     Mental Status: She is alert and oriented to person, place, and time.  Psychiatric:        Mood and Affect: Mood normal.      ____________________________________________   LABS (all labs ordered are listed, but only abnormal results are displayed)  Labs Reviewed  BASIC METABOLIC PANEL - Abnormal; Notable for the following components:      Result Value   Glucose, Bld 121 (*)    Calcium 8.8 (*)    All other components within normal limits  CBC - Abnormal; Notable for the following components:    RBC 3.41 (*)    Hemoglobin 10.7 (*)    HCT 33.4 (*)    All other components within normal limits  TROPONIN I (HIGH SENSITIVITY)  TROPONIN I (HIGH SENSITIVITY)   ____________________________________________  EKG  Sinus rhythm with a ventricular rate of 76, normal axis, T wave inversion in axilla ST change in lead III, nonspecific change in aVF, lead II, V3 without other evidence of acute ischemia or significant Arrhythmia.  These changes appear all largely stable from ECG obtained on 05/29/2020.  ____________________________________________  RADIOLOGY  ED MD interpretation: No focal consolidation, effusion, significant edema, pneumothorax or any other clear acute intrathoracic process.  Official radiology report(s): DG Chest 2 View  Result Date: 07/04/2020 CLINICAL DATA:  Chest pain, pressure EXAM: CHEST - 2 VIEW COMPARISON:  05/28/2020 FINDINGS: Heart is normal size. No confluent airspace opacities or effusions. Scoliosis in the lower thoracic and upper lumbar spine. No acute bony abnormality. IMPRESSION: No active cardiopulmonary disease. Electronically Signed   By: Charlett Nose M.D.   On: 07/04/2020 18:25    ____________________________________________   PROCEDURES  Procedure(s) performed (including Critical Care):  .1-3 Lead EKG Interpretation Performed by: Gilles Chiquito, MD Authorized by: Gilles Chiquito, MD     Interpretation: normal     ECG rate assessment: normal     Rhythm: sinus rhythm     Ectopy: none     Conduction: normal       ____________________________________________   INITIAL IMPRESSION / ASSESSMENT AND PLAN / ED COURSE      Patient presents for assessment accompanied by her husband who assist in providing some of the above history for assessment of some chest pain she had earlier today.  On arrival she is pain-free and seems this pain is very similar to pain  she has been assessed for a couple of times over the last month.  Diagnosis of  esophageal spasm.  On arrival she is afebrile and hemodynamically stable.  What is in the possible she may have had an esophageal spasm and given some fullness in her throat and chronic cough certainly possible she has some reflux additional differential includes pneumonia, ACS, PE, pneumothorax, dissection, possible laryngitis.  ECG is unchanged from prior and is no evidence of acute ischemia.  Given nonelevated troponin obtained.  3 hours after symptom onset a very low suspicion for ACS or myocarditis.  CBC shows no leukocytosis or acute anemia.  BMP shows no significant electrolyte or metabolic derangements.  Chest x-ray has no evidence of pneumonia, thorax effusion waiting this time or other acute abnormalities.  Very low suspicion for dissection given waxing and waning symptoms over the past month with none on presentation and narrow chest mediastinum with symmetric upper extremity pulses. Very low suspicion for PE at this time given absence of any chest pain or shortness of breath, tachycardia, tachypnea, hypoxia, hemoptysis or other historical or exam factors at this time.  Suspect patient may have component of esophageal spasm versus some reflux.  Given she is asymptomatic with otherwise stable vitals and reassuring work-up I believe she is safe for discharge with plan for outpatient GI follow-up.  She is already on an antacid.  Discharge stable condition.  Strict return precautions advised and discussed.       ____________________________________________   FINAL CLINICAL IMPRESSION(S) / ED DIAGNOSES  Final diagnoses:  Chest pain, unspecified type    Medications - No data to display   ED Discharge Orders    None       Note:  This document was prepared using Dragon voice recognition software and may include unintentional dictation errors.   Gilles Chiquito, MD 07/04/20 2139

## 2020-07-15 DIAGNOSIS — R198 Other specified symptoms and signs involving the digestive system and abdomen: Secondary | ICD-10-CM | POA: Diagnosis not present

## 2020-08-18 ENCOUNTER — Emergency Department
Admission: EM | Admit: 2020-08-18 | Discharge: 2020-08-18 | Disposition: A | Discharge status: Home / Self Care | Payer: Medicare Other | Attending: Emergency Medicine | Admitting: Emergency Medicine

## 2020-08-18 ENCOUNTER — Emergency Department: Payer: Medicare Other

## 2020-08-18 DIAGNOSIS — M4184 Other forms of scoliosis, thoracic region: Secondary | ICD-10-CM | POA: Diagnosis not present

## 2020-08-18 DIAGNOSIS — K224 Dyskinesia of esophagus: Secondary | ICD-10-CM | POA: Diagnosis not present

## 2020-08-18 DIAGNOSIS — I10 Essential (primary) hypertension: Secondary | ICD-10-CM | POA: Insufficient documentation

## 2020-08-18 DIAGNOSIS — I517 Cardiomegaly: Secondary | ICD-10-CM | POA: Diagnosis not present

## 2020-08-18 DIAGNOSIS — F0391 Unspecified dementia with behavioral disturbance: Secondary | ICD-10-CM | POA: Insufficient documentation

## 2020-08-18 DIAGNOSIS — Z7982 Long term (current) use of aspirin: Secondary | ICD-10-CM | POA: Diagnosis not present

## 2020-08-18 DIAGNOSIS — M47814 Spondylosis without myelopathy or radiculopathy, thoracic region: Secondary | ICD-10-CM | POA: Diagnosis not present

## 2020-08-18 DIAGNOSIS — Z79899 Other long term (current) drug therapy: Secondary | ICD-10-CM | POA: Insufficient documentation

## 2020-08-18 DIAGNOSIS — E119 Type 2 diabetes mellitus without complications: Secondary | ICD-10-CM | POA: Diagnosis not present

## 2020-08-18 NOTE — ED Provider Notes (Signed)
[facility] Emergency Department Provider Note ____________________________________________   Event Date/Time   First MD Initiated Contact with Patient 08/18/20 2252     (approximate)  I have reviewed the triage vital signs and the nursing notes.  HISTORY  Chief Complaint Esophogeal spasms   HPI Crystal Newton is a 75 y.o. femalewho presents to the ED for evaluation of esophageal FB sensation.   Chart review indicates hx dementia with chronic globus sensation Possible esophageal spasms.  Last seeing GI as an outpatient on 3/30.  Was offered EGD in the past but declined due to her mental status and dementia.  Patient presents to the ED with her husband, who provides majority of history, for evaluation of acute on chronic globus sensation and sensation of esophageal spasm.  Husband reports that this is a frequent issue for them, daily having "attacks" of esophageal spasming and pain.  He reports concern that she focuses on this and thereby making her symptoms worse.  He reports "a bad day" of typical symptoms of globus sensation and atraumatic throat pain.  Husband reports this did not happen after a meal and he has no concerns for impacted food or foreign body.  Here in the ED, patient reports feeling well and that her symptoms have improved.  We discussed reassuring chest x-ray findings and her clinical picture.  Shared decision-making yields plan to discharge with return precautions and following up with GI as she feels fine at this point.   Past Medical History:  Diagnosis Date  . Anxiety   . Depression   . Diabetes mellitus    diet controlled  . Hyperlipidemia   . Hypertension   . Kidney stones   . Memory difficulties   . TIA (transient ischemic attack)    found on MRI.    Patient Active Problem List   Diagnosis Date Noted  . Cold intolerance 12/12/2019  . Acute pain of right shoulder 12/12/2019  . Acute thoracic back pain 12/14/2018  .  Weight loss, unintentional 09/28/2018  . Chronic insomnia 03/14/2017  . Digital mucinous cyst of finger of right hand 09/09/2016  . Bilateral hip bursitis 03/08/2016  . Counseling regarding end of life decision making 02/03/2015  . Dementia, vascular, mixed, with behavioral disturbance (HCC) 06/14/2012  . OSTEOARTHROSIS UNSPEC WHETHER GEN/LOCALIZED HAND 01/14/2008  . Diabetes mellitus with no complication (HCC) 12/21/2006  . Hyperlipidemia LDL goal <100 12/21/2006  . Generalized anxiety disorder 12/21/2006  . MDD (major depressive disorder), recurrent episode, severe (HCC) 12/21/2006  . COMMON MIGRAINE 12/21/2006  . Essential hypertension, benign 12/21/2006  . PUD 12/21/2006    Past Surgical History:  Procedure Laterality Date  . ABDOMINAL HYSTERECTOMY    . BREAST BIOPSY    . CATARACT EXTRACTION W/PHACO Right 10/17/2019   Procedure: CATARACT EXTRACTION PHACO AND INTRAOCULAR LENS PLACEMENT (IOC) RIGHT DIABETIC VISION BLUE 9.96  00:59.0;  Surgeon: Elliot Cousin, MD;  Location: [facility];  Service: Ophthalmology;  Laterality: Right;  Diabetic - diet controlled  . PARTIAL COLECTOMY  1996   ruptured abcess for diverticulitis  . PARTIAL HYSTERECTOMY     uterine prolapse  . right parotid      Prior to Admission medications   Medication Sig Start Date End Date Taking? Authorizing Provider  meloxicam (MOBIC) 15 MG tablet TAKE 1 TABLET BY MOUTH EVERY DAY 12/02/19   Bedsole, Amy E, MD  ASPIRIN 81 PO Take by mouth daily.    [provider]  atorvastatin (LIPITOR) 80 MG tablet  TAKE 1 TABLET BY MOUTH EVERY DAY 12/02/19   Bedsole, Amy E, MD  donepezil (ARICEPT) 10 MG tablet TAKE 1 TABLET BY MOUTH EVERY DAY EVERY NIGHT 11/18/19   Bedsole, Amy E, MD  lisinopril (ZESTRIL) 5 MG tablet TAKE 1 TABLET BY MOUTH EVERY DAY 01/02/20   Bedsole, Amy E, MD  memantine (NAMENDA) 10 MG tablet Take 0.5 tablets (5 mg total) by mouth 2 (two) times daily. 10/15/19   Bedsole, Amy E, MD  Multiple  Vitamins-Minerals (CENTRUM SILVER PO) Take 1 tablet by mouth daily.    [provider]  nitroGLYCERIN (NITROSTAT) 0.3 MG SL tablet Place 1 tablet (0.3 mg total) under the tongue every 5 (five) minutes as needed for chest pain (call your doctor if pain persists after 3 doses). 05/28/20 05/28/21  Shaune Pollack, MD  pantoprazole (PROTONIX) 40 MG tablet TAKE 1 TABLET BY MOUTH EVERY DAY 01/21/20   Bedsole, Amy E, MD  propranolol (INDERAL) 40 MG tablet TAKE 1 TABLET BY MOUTH TWICE A DAY 06/15/20   Bedsole, Amy E, MD  risperiDONE (RISPERDAL) 0.5 MG tablet Take by mouth. 09/30/19   [provider]  TRINTELLIX 20 MG TABS tablet Take 20 mg by mouth daily. 09/21/19   [provider]  vitamin B-12 (CYANOCOBALAMIN) 1000 MCG tablet Take 1,000 mcg by mouth daily.    [provider]  Vitamin D3 (VITAMIN D) 25 MCG tablet Take 1,000 Units by mouth daily.    [provider]    Allergies Patient has no known allergies.  Family History  Problem Relation Age of Onset  . Alzheimer's disease Mother   . Diabetes Maternal Grandfather   . Coronary artery disease Maternal Grandfather     Social History Social History   Tobacco Use  . Smoking status: Never Smoker  . Smokeless tobacco: Never Used  Vaping Use  . Vaping Use: Never used  Substance Use Topics  . Alcohol use: No    Alcohol/week: 0.0 standard drinks  . Drug use: No    Review of Systems  Constitutional: No fever/chills Eyes: No visual changes. ENT: No sore throat. Cardiovascular: Denies chest pain. Respiratory: Denies shortness of breath. Gastrointestinal: No abdominal pain.  No nausea, no vomiting.  No diarrhea.  No constipation. Genitourinary: Negative for dysuria. Musculoskeletal: Negative for back pain. Skin: Negative for rash. Neurological: Negative for headaches, focal weakness or numbness.  ____________________________________________   PHYSICAL EXAM:  VITAL SIGNS: Vitals:   08/18/20  2106  BP: 133/85  Pulse: (!) 103  Resp: 18  Temp: 98.5 F (36.9 C)  SpO2: 95%      Constitutional: Alert and pleasantly disoriented. Well appearing and in no acute distress. Eyes: Conjunctivae are normal. PERRL. EOMI. Head: Atraumatic. Nose: No congestion/rhinnorhea. Mouth/Throat: Mucous membranes are moist.  Oropharynx non-erythematous. Uvula is midline.  She is able to swallow her secretions. No signs of trauma to the neck or meningismus.  No thyromegaly appreciated. Neck: No stridor. No cervical spine tenderness to palpation. Cardiovascular: Normal rate, regular rhythm. Grossly normal heart sounds.  Good peripheral circulation. Respiratory: Normal respiratory effort.  No retractions. Lungs CTAB. Gastrointestinal: Soft , nondistended, nontender to palpation. No CVA tenderness. Musculoskeletal: No lower extremity tenderness nor edema.  No joint effusions. No signs of acute trauma. Neurologic:  Normal speech and language. No gross focal neurologic deficits are appreciated. No gait instability noted. Skin:  Skin is warm, dry and intact. No rash noted. Psychiatric: Mood and affect are normal. Speech and behavior are normal. ____________________________________________  RADIOLOGY  ED MD interpretation: 2 view CXR reviewed by me without evidence of acute cardiopulmonary pathology.  Official radiology report(s): DG Chest 2 View  Result Date: 08/18/2020 CLINICAL DATA:  Esophageal spasm. EXAM: CHEST - 2 VIEW COMPARISON:  July 04, 2020 FINDINGS: Diffuse, mild to moderate severity chronic appearing increased interstitial lung markings are seen. There is no evidence of a pleural effusion or pneumothorax. The cardiac silhouette is mildly enlarged. Moderate severity levoscoliosis of the lower thoracic spine is seen with multilevel degenerative changes. IMPRESSION: Stable exam without active cardiopulmonary disease. Electronically Signed   By: Aram Candela M.D.   On: 08/18/2020 22:58     ____________________________________________   PROCEDURES and INTERVENTIONS  Procedure(s) performed (including Critical Care):  .1-3 Lead EKG Interpretation Performed by: Delton Prairie, MD Authorized by: Delton Prairie, MD     Interpretation: normal     ECG rate:  98   ECG rate assessment: normal     Rhythm: sinus rhythm     Ectopy: none     Conduction: normal      Medications - No data to display  ____________________________________________   MDM / ED COURSE   75 year old woman with history of dementia and chronic esophageal globus sensation and possible spasm, presents to the ED with acute on chronic symptoms that have since resolved by the time I evaluate the patient.  She looks well to me and reports feeling normal.  CXR without evidence of foreign body, cardiopulmonary pathology or pneumomediastinum.  She is benign abdomen and looks well clinically.  Long discussion at the bedside with husband and patient about observation admission for EGD versus continued outpatient follow-up with Dr. Mia Creek.  They report they still want to talk about performing the EGD and have not come to a decision at this point.  We discussed adherence to her PPI and we discussed return precautions for the ED.  Patient stable for outpatient management      ____________________________________________   FINAL CLINICAL IMPRESSION(S) / ED DIAGNOSES  Final diagnoses:  Esophageal spasm     ED Discharge Orders    None       Zay Yeargan   Note:  This document was prepared using Dragon voice recognition software and may include unintentional dictation errors.   Delton Prairie, MD 08/18/20 [phone]

## 2020-08-18 NOTE — ED Triage Notes (Signed)
Pt has recently been diagnosed with esophogeal spasms, pt comes in tonight c/o feeling like something was stuck in her throat. Pt denies shortness of breath. Pt also c/o nausea.

## 2020-08-18 NOTE — ED Notes (Signed)
Patient denies pain. States "spasms in throat" cause more discomfort, rather than actual pain. Pt states she is not experiencing any discomfort at this time.

## 2020-08-18 NOTE — ED Provider Notes (Signed)
MSE was initiated and I personally evaluated the patient and placed orders (if any) at  10:37 PM on Aug 18, 2020.  The patient appears stable so that the remainder of the MSE may be completed by another provider.   Dionne Bucy, MD 08/18/20 2238

## 2020-08-18 NOTE — Discharge Instructions (Signed)
As we discussed, please continue all of her acid medications and follow-up with Dr. Mia Creek to discuss having the endoscopy performed.  If she develops any severely worsening symptoms, inability to swallow her secretions, choking episodes, please return to the ED.

## 2020-08-25 DIAGNOSIS — K219 Gastro-esophageal reflux disease without esophagitis: Secondary | ICD-10-CM | POA: Diagnosis not present

## 2020-08-25 DIAGNOSIS — R0989 Other specified symptoms and signs involving the circulatory and respiratory systems: Secondary | ICD-10-CM | POA: Diagnosis not present

## 2020-09-18 ENCOUNTER — Encounter: Payer: Self-pay | Admitting: *Deleted

## 2020-09-21 ENCOUNTER — Other Ambulatory Visit: Payer: Self-pay

## 2020-09-21 ENCOUNTER — Ambulatory Visit
Admission: RE | Admit: 2020-09-21 | Discharge: 2020-09-21 | Disposition: A | Discharge status: Home / Self Care | Payer: Medicare Other | Attending: Gastroenterology | Admitting: Gastroenterology

## 2020-09-21 ENCOUNTER — Encounter: Payer: Self-pay | Admitting: *Deleted

## 2020-09-21 ENCOUNTER — Ambulatory Visit: Payer: Medicare Other | Admitting: Anesthesiology

## 2020-09-21 ENCOUNTER — Encounter
Admission: RE | Disposition: A | Discharge status: Home / Self Care | Payer: Self-pay | Source: Home / Self Care | Attending: Gastroenterology

## 2020-09-21 DIAGNOSIS — K449 Diaphragmatic hernia without obstruction or gangrene: Secondary | ICD-10-CM | POA: Diagnosis not present

## 2020-09-21 DIAGNOSIS — Z7982 Long term (current) use of aspirin: Secondary | ICD-10-CM | POA: Diagnosis not present

## 2020-09-21 DIAGNOSIS — K219 Gastro-esophageal reflux disease without esophagitis: Secondary | ICD-10-CM | POA: Diagnosis not present

## 2020-09-21 DIAGNOSIS — K319 Disease of stomach and duodenum, unspecified: Secondary | ICD-10-CM | POA: Diagnosis not present

## 2020-09-21 DIAGNOSIS — R110 Nausea: Secondary | ICD-10-CM | POA: Diagnosis present

## 2020-09-21 DIAGNOSIS — Z79899 Other long term (current) drug therapy: Secondary | ICD-10-CM | POA: Insufficient documentation

## 2020-09-21 DIAGNOSIS — F458 Other somatoform disorders: Secondary | ICD-10-CM | POA: Diagnosis present

## 2020-09-21 DIAGNOSIS — K3189 Other diseases of stomach and duodenum: Secondary | ICD-10-CM | POA: Diagnosis not present

## 2020-09-21 DIAGNOSIS — F0390 Unspecified dementia without behavioral disturbance: Secondary | ICD-10-CM | POA: Diagnosis not present

## 2020-09-21 DIAGNOSIS — Z791 Long term (current) use of non-steroidal anti-inflammatories (NSAID): Secondary | ICD-10-CM | POA: Diagnosis not present

## 2020-09-21 DIAGNOSIS — K2100 Gastro-esophageal reflux disease with esophagitis, without bleeding: Secondary | ICD-10-CM | POA: Diagnosis not present

## 2020-09-21 DIAGNOSIS — E785 Hyperlipidemia, unspecified: Secondary | ICD-10-CM | POA: Diagnosis not present

## 2020-09-21 DIAGNOSIS — K2970 Gastritis, unspecified, without bleeding: Secondary | ICD-10-CM | POA: Diagnosis not present

## 2020-09-21 HISTORY — DX: Cerebral infarction, unspecified: I63.9

## 2020-09-21 HISTORY — DX: Headache, unspecified: R51.9

## 2020-09-21 HISTORY — DX: Sleep disorder, unspecified: G47.9

## 2020-09-21 HISTORY — PX: ESOPHAGOGASTRODUODENOSCOPY (EGD) WITH PROPOFOL: SHX5813

## 2020-09-21 HISTORY — DX: Unspecified osteoarthritis, unspecified site: M19.90

## 2020-09-21 HISTORY — DX: Personal history of urinary calculi: Z87.442

## 2020-09-21 SURGERY — ESOPHAGOGASTRODUODENOSCOPY (EGD) WITH PROPOFOL
Anesthesia: General

## 2020-09-21 MED ORDER — PROPOFOL 500 MG/50ML IV EMUL
INTRAVENOUS | Status: AC
  Filled 2020-09-21: qty 50

## 2020-09-21 MED ORDER — SODIUM CHLORIDE 0.9 % IV SOLN: INTRAVENOUS | Status: DC

## 2020-09-21 MED ORDER — PROPOFOL 10 MG/ML IV BOLUS
INTRAVENOUS | Status: DC | PRN
  Administered 2020-09-21: 20 mg via INTRAVENOUS
  Administered 2020-09-21: 50 mg via INTRAVENOUS

## 2020-09-21 NOTE — Anesthesia Postprocedure Evaluation (Signed)
Anesthesia Post Note  Patient: Crystal Newton  Procedure(s) Performed: ESOPHAGOGASTRODUODENOSCOPY (EGD) WITH PROPOFOL (N/A )  Patient location during evaluation: Endoscopy Anesthesia Type: General Level of consciousness: awake and alert Pain management: pain level controlled Vital Signs Assessment: post-procedure vital signs reviewed and stable Respiratory status: spontaneous breathing, nonlabored ventilation, respiratory function stable and patient connected to nasal cannula oxygen Cardiovascular status: blood pressure returned to baseline and stable Postop Assessment: no apparent nausea or vomiting Anesthetic complications: no   No complications documented.   Last Vitals:  Vitals:   09/21/20 1035 09/21/20 1042  BP: 102/69 (!) 94/55  Pulse:    Resp: 16   Temp:  (!) 36.3 C  SpO2: 97%     Last Pain:  Vitals:   09/21/20 1044  TempSrc:   PainSc: 0-No pain                 Cleda Mccreedy [name]

## 2020-09-21 NOTE — Interval H&P Note (Signed)
History and Physical Interval Note:  09/21/2020 10:12 AM  Crystal Newton  has presented today for surgery, with the diagnosis of GLOBUS SENSATION GERD.  The various methods of treatment have been discussed with the patient and family. After consideration of risks, benefits and other options for treatment, the patient has consented to  Procedure(s) with comments: ESOPHAGOGASTRODUODENOSCOPY (EGD) WITH PROPOFOL (N/A) - REQUESTS AM as a surgical intervention.  The patient's history has been reviewed, patient examined, no change in status, stable for surgery.  I have reviewed the patient's chart and labs.  Questions were answered to the patient's satisfaction.     Regis Bill  Ok to proceed with EGD

## 2020-09-21 NOTE — Transfer of Care (Signed)
Immediate Anesthesia Transfer of Care Note  Patient: Crystal Newton  Procedure(s) Performed: ESOPHAGOGASTRODUODENOSCOPY (EGD) WITH PROPOFOL (N/A )  Patient Location: PACU and Endoscopy Unit  Anesthesia Type:General  Level of Consciousness: drowsy and patient cooperative  Airway & Oxygen Therapy: Patient Spontanous Breathing  Post-op Assessment: Report given to RN and Post -op Vital signs reviewed and stable  Post vital signs: Reviewed and stable  Last Vitals:  Vitals Value Taken Time  BP 102/69 09/21/20 1035  Temp 36.1 C 09/21/20 1033  Pulse 72 09/21/20 1036  Resp 17 09/21/20 1036  SpO2 100 % 09/21/20 1036  Vitals shown include unvalidated device data.  Last Pain:  Vitals:   09/21/20 1033  TempSrc:   PainSc: 0-No pain         Complications: No complications documented.

## 2020-09-21 NOTE — Anesthesia Preprocedure Evaluation (Signed)
Anesthesia Evaluation  Patient identified by MRN, date of birth, ID band Patient awake    Reviewed: Allergy & Precautions, NPO status , Patient's Chart, lab work & pertinent test results  Airway Mallampati: III  TM Distance: <3 FB Neck ROM: limited    Dental  (+) Chipped, Missing   Pulmonary neg pulmonary ROS, neg shortness of breath,    Pulmonary exam normal        Cardiovascular hypertension, (-) anginaNormal cardiovascular exam     Neuro/Psych  Headaches, PSYCHIATRIC DISORDERS Dementia TIACVA negative psych ROS   GI/Hepatic Neg liver ROS, PUD, GERD  Medicated and Controlled,  Endo/Other  diabetes, Type 2  Renal/GU negative Renal ROS  negative genitourinary   Musculoskeletal   Abdominal   Peds  Hematology negative hematology ROS (+)   Anesthesia Other Findings Past Medical History: No date: Anxiety No date: Arthritis No date: Depression No date: Diabetes mellitus     Comment:  diet controlled No date: Headache     Comment:  migraine headache No date: History of kidney stones No date: Hyperlipidemia No date: Hypertension No date: Memory difficulties No date: Sleep disorder No date: Stroke [facility]) No date: TIA (transient ischemic attack)     Comment:  found on MRI.  Past Surgical History: No date: ABDOMINAL HYSTERECTOMY No date: BREAST BIOPSY 10/17/2019: CATARACT EXTRACTION W/PHACO; Right     Comment:  Procedure: CATARACT EXTRACTION PHACO AND INTRAOCULAR               LENS PLACEMENT (IOC) RIGHT DIABETIC VISION BLUE 9.96                00:59.0;  Surgeon: Elliot Cousin, MD;  Location: [facility];  Service: Ophthalmology;  Laterality:               Right;  Diabetic - diet controlled No date: DILATION AND CURETTAGE OF UTERUS 1996: PARTIAL COLECTOMY     Comment:  ruptured abcess for diverticulitis No date: PARTIAL HYSTERECTOMY     Comment:  uterine prolapse No date: right parotid No  date: TONSILLECTOMY     Reproductive/Obstetrics negative OB ROS                             Anesthesia Physical Anesthesia Plan  ASA: III  Anesthesia Plan: General   Post-op Pain Management:    Induction: Intravenous  PONV Risk Score and Plan: Propofol infusion and TIVA  Airway Management Planned: Natural Airway and Nasal Cannula  Additional Equipment:   Intra-op Plan:   Post-operative Plan:   Informed Consent: I have reviewed the patients History and Physical, chart, labs and discussed the procedure including the risks, benefits and alternatives for the proposed anesthesia with the patient or authorized representative who has indicated his/her understanding and acceptance.     Dental Advisory Given  Plan Discussed with: Anesthesiologist, CRNA and Surgeon  Anesthesia Plan Comments: (Patient and husband consented for risks of anesthesia including but not limited to:  - adverse reactions to medications - risk of airway placement if required - damage to eyes, teeth, lips or other oral mucosa - nerve damage due to positioning  - sore throat or hoarseness - Damage to heart, brain, nerves, lungs, other parts of body or loss of life  They voiced understanding.)        Anesthesia Quick Evaluation

## 2020-09-21 NOTE — Op Note (Signed)
[facility] Patient Name: Crystal Newton Procedure Date: 09/21/2020 10:10 AM MRN: [id number] Account #: [id number] Date of Birth: 08/26/1945 Admit Type: Outpatient Age: 75 Room: [facility] ENDO ROOM 3 Gender: Female Note Status: Finalized Procedure:             Upper GI endoscopy Indications:           Globus sensation, Nausea Providers:             Andrey Farmer MD, MD Referring MD:          No Local Md, MD (Referring MD) Medicines:             Monitored Anesthesia Care Complications:         No immediate complications. Estimated blood loss:                         Minimal. Procedure:             Pre-Anesthesia Assessment:                        - Prior to the procedure, a History and Physical was                         performed, and patient medications and allergies were                         reviewed. The patient is competent. The risks and                         benefits of the procedure and the sedation options and                         risks were discussed with the patient. All questions                         were answered and informed consent was obtained.                         Patient identification and proposed procedure were                         verified by the physician, the nurse, the anesthetist                         and the technician in the endoscopy suite. Mental                         Status Examination: alert and oriented. Airway                         Examination: normal oropharyngeal airway and neck                         mobility. Respiratory Examination: clear to                         auscultation. CV Examination: normal. Prophylactic  Antibiotics: The patient does not require prophylactic                         antibiotics. Prior Anticoagulants: The patient has                         taken no previous anticoagulant or antiplatelet                         agents. ASA Grade Assessment: III  - A patient with                         severe systemic disease. After reviewing the risks and                         benefits, the patient was deemed in satisfactory                         condition to undergo the procedure. The anesthesia                         plan was to use monitored anesthesia care (MAC).                         Immediately prior to administration of medications,                         the patient was re-assessed for adequacy to receive                         sedatives. The heart rate, respiratory rate, oxygen                         saturations, blood pressure, adequacy of pulmonary                         ventilation, and response to care were monitored                         throughout the procedure. The physical status of the                         patient was re-assessed after the procedure.                        After obtaining informed consent, the endoscope was                         passed under direct vision. Throughout the procedure,                         the patient's blood pressure, pulse, and oxygen                         saturations were monitored continuously. The Endoscope                         was introduced through the mouth, and advanced to the  second part of duodenum. The upper GI endoscopy was                         accomplished without difficulty. The patient tolerated                         the procedure well. Findings:      A 6 cm hiatal hernia was present.      Localized mild mucosal changes characterized by erythema were found at       the gastroesophageal junction. Biopsies were taken with a cold forceps       for histology. Estimated blood loss was minimal.      The entire examined stomach was normal. Biopsies were taken with a cold       forceps for Helicobacter pylori testing. Estimated blood loss was       minimal.      The examined duodenum was normal.      Biopsies were taken with a cold  forceps in the middle third of the       esophagus for histology. Impression:            - 6 cm hiatal hernia.                        - Erythematous mucosa in the esophagus. Biopsied.                        - Normal stomach. Biopsied.                        - Normal examined duodenum.                        - Biopsies were taken with a cold forceps for                         histology in the middle third of the esophagus. Recommendation:        - Await pathology results.                        - Discharge patient to home.                        - Resume previous diet.                        - Continue present medications.                        - Return to referring physician as previously                         scheduled. Procedure Code(s):     --- Professional ---                        [phone], Esophagogastroduodenoscopy, flexible,                         transoral; with biopsy, single or multiple Diagnosis Code(s):     --- Professional ---  K44.9, Diaphragmatic hernia without obstruction or                         gangrene                        K22.8, Other specified diseases of esophagus                        F45.8, Other somatoform disorders                        R11.0, Nausea CPT copyright 2019 [employer]. All rights reserved. The codes documented in this report are preliminary and upon coder review may  be revised to meet current compliance requirements. Andrey Farmer MD, MD 09/21/2020 10:37:29 AM Number of Addenda: 0 Note Initiated On: 09/21/2020 10:10 AM Estimated Blood Loss:  Estimated blood loss was minimal.      [facility]

## 2020-09-21 NOTE — H&P (Signed)
Outpatient short stay form Pre-procedure 09/21/2020 10:09 AM Merlyn Lot MD, MPH  Primary Physician: NP Fields  Reason for visit:  Globus sensation  History of present illness:   75 y/o lady with history of dementia here for EGD for globus sensation. Also endorses nausea. No blood thinners. Had normal barium swallow.    Current Facility-Administered Medications:  .  0.9 %  sodium chloride infusion, , Intravenous, Continuous, Dearion Huot, Rossie Muskrat, MD, Last Rate: 20 mL/hr at 09/21/20 1006, Continued from Pre-op at 09/21/20 1006  Medications Prior to Admission  Medication Sig Dispense Refill Last Dose  . ASPIRIN 81 PO Take by mouth daily.   Past Week at Unknown time  . atorvastatin (LIPITOR) 80 MG tablet TAKE 1 TABLET BY MOUTH EVERY DAY 90 tablet 3 09/21/2020 at 0600  . benzonatate (TESSALON) 200 MG capsule Take 200 mg by mouth 3 (three) times daily as needed for cough.   Past Month at Unknown time  . busPIRone (BUSPAR) 5 MG tablet Take 5 mg by mouth 2 (two) times daily.   09/21/2020 at 0600  . cetirizine (ZYRTEC) 10 MG tablet Take 10 mg by mouth daily.   09/21/2020 at 0600  . divalproex (DEPAKOTE) 125 MG DR tablet Take 125 mg by mouth 3 (three) times daily.   09/21/2020 at 0600  . donepezil (ARICEPT) 10 MG tablet TAKE 1 TABLET BY MOUTH EVERY DAY EVERY NIGHT 90 tablet 3 09/21/2020 at 0600  . famotidine (PEPCID) 40 MG tablet Take 40 mg by mouth daily.   Past Week at Unknown time  . guaiFENesin (MUCINEX) 600 MG 12 hr tablet Take by mouth 2 (two) times daily.   Past Week at Unknown time  . hyoscyamine (LEVBID) 0.375 MG 12 hr tablet Take 0.375 mg by mouth every 12 (twelve) hours as needed for cramping.   09/21/2020 at 0600  . lisinopril (ZESTRIL) 5 MG tablet TAKE 1 TABLET BY MOUTH EVERY DAY 90 tablet 1 09/21/2020 at 0600  . meloxicam (MOBIC) 15 MG tablet TAKE 1 TABLET BY MOUTH EVERY DAY 15 tablet 0 Past Week at Unknown time  . memantine (NAMENDA) 10 MG tablet Take 0.5 tablets (5 mg total) by mouth 2 (two)  times daily. 180 tablet 3 09/21/2020 at 0600  . Multiple Vitamins-Minerals (CENTRUM SILVER PO) Take 1 tablet by mouth daily.   Past Week at Unknown time  . nitroGLYCERIN (NITROSTAT) 0.3 MG SL tablet Place 1 tablet (0.3 mg total) under the tongue every 5 (five) minutes as needed for chest pain (call your doctor if pain persists after 3 doses). 100 tablet 0 Past Month at Unknown time  . pantoprazole (PROTONIX) 40 MG tablet TAKE 1 TABLET BY MOUTH EVERY DAY 90 tablet 3 09/21/2020 at 0600  . risperiDONE (RISPERDAL) 0.5 MG tablet Take by mouth.   09/21/2020 at 0600  . TRINTELLIX 20 MG TABS tablet Take 20 mg by mouth daily.   09/21/2020 at 0600  . vitamin B-12 (CYANOCOBALAMIN) 1000 MCG tablet Take 1,000 mcg by mouth daily.   Past Week at Unknown time  . Vitamin D3 (VITAMIN D) 25 MCG tablet Take 1,000 Units by mouth daily.   Past Week at Unknown time  . promethazine (PHENERGAN) 12.5 MG tablet Take 12.5 mg by mouth every 6 (six) hours as needed for nausea or vomiting. (Patient not taking: Reported on 09/21/2020)   Not Taking at Unknown time  . propranolol (INDERAL) 40 MG tablet TAKE 1 TABLET BY MOUTH TWICE A DAY 180 tablet 1  No Known Allergies   Past Medical History:  Diagnosis Date  . Anxiety   . Arthritis   . Depression   . Diabetes mellitus    diet controlled  . Headache    migraine headache  . History of kidney stones   . Hyperlipidemia   . Hypertension   . Memory difficulties   . Sleep disorder   . Stroke (HCC)   . TIA (transient ischemic attack)    found on MRI.    Review of systems:  Otherwise negative.    Physical Exam  Gen: Alert, oriented. Appears stated age.  HEENT: PERRLA. Lungs: No respiratory distress CV: RRR Abd: soft, benign, no masses Ext: No edema    Planned procedures: Proceed with EGD. The patient understands the nature of the planned procedure, indications, risks, alternatives and potential complications including but not limited to bleeding, infection,  perforation, damage to internal organs and possible oversedation/side effects from anesthesia. The patient agrees and gives consent to proceed.  Please refer to procedure notes for findings, recommendations and patient disposition/instructions.     Merlyn Lot MD, MPH Gastroenterology 09/21/2020  10:09 AM

## 2020-09-22 ENCOUNTER — Encounter: Payer: Self-pay | Admitting: Gastroenterology

## 2020-09-23 LAB — SURGICAL PATHOLOGY

## 2020-10-09 ENCOUNTER — Ambulatory Visit: Payer: Medicare Other

## 2020-10-15 DIAGNOSIS — K219 Gastro-esophageal reflux disease without esophagitis: Secondary | ICD-10-CM | POA: Diagnosis not present

## 2020-10-15 DIAGNOSIS — E785 Hyperlipidemia, unspecified: Secondary | ICD-10-CM | POA: Diagnosis not present

## 2020-10-15 DIAGNOSIS — Z0000 Encounter for general adult medical examination without abnormal findings: Secondary | ICD-10-CM | POA: Diagnosis not present

## 2020-10-15 DIAGNOSIS — E119 Type 2 diabetes mellitus without complications: Secondary | ICD-10-CM | POA: Diagnosis not present

## 2020-10-15 DIAGNOSIS — I10 Essential (primary) hypertension: Secondary | ICD-10-CM | POA: Diagnosis not present

## 2020-10-15 DIAGNOSIS — K224 Dyskinesia of esophagus: Secondary | ICD-10-CM | POA: Diagnosis not present

## 2020-10-15 DIAGNOSIS — R0989 Other specified symptoms and signs involving the circulatory and respiratory systems: Secondary | ICD-10-CM | POA: Diagnosis not present

## 2020-10-21 DIAGNOSIS — K219 Gastro-esophageal reflux disease without esophagitis: Secondary | ICD-10-CM | POA: Diagnosis not present

## 2020-10-21 DIAGNOSIS — E785 Hyperlipidemia, unspecified: Secondary | ICD-10-CM | POA: Diagnosis not present

## 2020-10-21 DIAGNOSIS — I10 Essential (primary) hypertension: Secondary | ICD-10-CM | POA: Diagnosis not present

## 2020-10-21 DIAGNOSIS — Z0000 Encounter for general adult medical examination without abnormal findings: Secondary | ICD-10-CM | POA: Diagnosis not present

## 2020-10-21 DIAGNOSIS — R0989 Other specified symptoms and signs involving the circulatory and respiratory systems: Secondary | ICD-10-CM | POA: Diagnosis not present

## 2020-10-21 DIAGNOSIS — M810 Age-related osteoporosis without current pathological fracture: Secondary | ICD-10-CM | POA: Diagnosis not present

## 2020-10-21 DIAGNOSIS — K224 Dyskinesia of esophagus: Secondary | ICD-10-CM | POA: Diagnosis not present

## 2020-10-21 DIAGNOSIS — E119 Type 2 diabetes mellitus without complications: Secondary | ICD-10-CM | POA: Diagnosis not present

## 2020-11-02 DIAGNOSIS — E538 Deficiency of other specified B group vitamins: Secondary | ICD-10-CM | POA: Diagnosis not present

## 2020-11-10 DIAGNOSIS — E119 Type 2 diabetes mellitus without complications: Secondary | ICD-10-CM | POA: Diagnosis not present

## 2020-11-24 ENCOUNTER — Ambulatory Visit
Admission: RE | Admit: 2020-11-24 | Discharge: 2020-11-24 | Disposition: A | Discharge status: Home / Self Care | Payer: Medicare Other | Source: Ambulatory Visit | Attending: Family Medicine | Admitting: Family Medicine

## 2020-11-24 ENCOUNTER — Other Ambulatory Visit: Payer: Self-pay

## 2020-11-24 ENCOUNTER — Other Ambulatory Visit: Payer: Self-pay | Admitting: Family Medicine

## 2020-11-24 DIAGNOSIS — W19XXXA Unspecified fall, initial encounter: Secondary | ICD-10-CM | POA: Insufficient documentation

## 2020-11-24 DIAGNOSIS — M25511 Pain in right shoulder: Secondary | ICD-10-CM | POA: Diagnosis not present

## 2020-11-24 DIAGNOSIS — S0990XA Unspecified injury of head, initial encounter: Secondary | ICD-10-CM | POA: Diagnosis not present

## 2020-11-24 DIAGNOSIS — Y92009 Unspecified place in unspecified non-institutional (private) residence as the place of occurrence of the external cause: Secondary | ICD-10-CM

## 2020-11-24 DIAGNOSIS — W01198A Fall on same level from slipping, tripping and stumbling with subsequent striking against other object, initial encounter: Secondary | ICD-10-CM | POA: Diagnosis not present

## 2020-11-24 DIAGNOSIS — I6782 Cerebral ischemia: Secondary | ICD-10-CM | POA: Insufficient documentation

## 2020-11-24 DIAGNOSIS — S42211A Unspecified displaced fracture of surgical neck of right humerus, initial encounter for closed fracture: Secondary | ICD-10-CM | POA: Diagnosis not present

## 2020-11-24 DIAGNOSIS — M79601 Pain in right arm: Secondary | ICD-10-CM | POA: Diagnosis not present

## 2020-11-24 DIAGNOSIS — G319 Degenerative disease of nervous system, unspecified: Secondary | ICD-10-CM | POA: Diagnosis not present

## 2020-11-25 DIAGNOSIS — S42291A Other displaced fracture of upper end of right humerus, initial encounter for closed fracture: Secondary | ICD-10-CM | POA: Diagnosis not present

## 2020-12-02 DIAGNOSIS — K449 Diaphragmatic hernia without obstruction or gangrene: Secondary | ICD-10-CM | POA: Diagnosis not present

## 2020-12-02 DIAGNOSIS — K219 Gastro-esophageal reflux disease without esophagitis: Secondary | ICD-10-CM | POA: Diagnosis not present

## 2020-12-07 DIAGNOSIS — S42291A Other displaced fracture of upper end of right humerus, initial encounter for closed fracture: Secondary | ICD-10-CM | POA: Diagnosis not present

## 2020-12-11 ENCOUNTER — Emergency Department: Payer: Medicare Other

## 2020-12-11 ENCOUNTER — Inpatient Hospital Stay
Admission: EM | Admit: 2020-12-11 | Discharge: 2020-12-15 | DRG: 521 | Disposition: A | Discharge status: Skilled Nursing Facility | Payer: Medicare Other | Attending: Internal Medicine | Admitting: Internal Medicine

## 2020-12-11 ENCOUNTER — Inpatient Hospital Stay: Payer: Medicare Other | Admitting: Anesthesiology

## 2020-12-11 ENCOUNTER — Inpatient Hospital Stay: Payer: Medicare Other

## 2020-12-11 ENCOUNTER — Other Ambulatory Visit: Payer: Self-pay

## 2020-12-11 ENCOUNTER — Encounter
Admission: EM | Disposition: A | Discharge status: Skilled Nursing Facility | Payer: Self-pay | Source: Home / Self Care | Attending: Internal Medicine

## 2020-12-11 DIAGNOSIS — Z8249 Family history of ischemic heart disease and other diseases of the circulatory system: Secondary | ICD-10-CM | POA: Diagnosis not present

## 2020-12-11 DIAGNOSIS — W1839XA Other fall on same level, initial encounter: Secondary | ICD-10-CM | POA: Diagnosis present

## 2020-12-11 DIAGNOSIS — S0990XA Unspecified injury of head, initial encounter: Secondary | ICD-10-CM | POA: Diagnosis not present

## 2020-12-11 DIAGNOSIS — Z96649 Presence of unspecified artificial hip joint: Secondary | ICD-10-CM

## 2020-12-11 DIAGNOSIS — S72001D Fracture of unspecified part of neck of right femur, subsequent encounter for closed fracture with routine healing: Secondary | ICD-10-CM | POA: Diagnosis not present

## 2020-12-11 DIAGNOSIS — F332 Major depressive disorder, recurrent severe without psychotic features: Secondary | ICD-10-CM | POA: Diagnosis present

## 2020-12-11 DIAGNOSIS — I10 Essential (primary) hypertension: Secondary | ICD-10-CM | POA: Diagnosis present

## 2020-12-11 DIAGNOSIS — Z20822 Contact with and (suspected) exposure to covid-19: Secondary | ICD-10-CM | POA: Diagnosis present

## 2020-12-11 DIAGNOSIS — I6782 Cerebral ischemia: Secondary | ICD-10-CM | POA: Diagnosis not present

## 2020-12-11 DIAGNOSIS — I639 Cerebral infarction, unspecified: Secondary | ICD-10-CM | POA: Diagnosis not present

## 2020-12-11 DIAGNOSIS — E119 Type 2 diabetes mellitus without complications: Secondary | ICD-10-CM | POA: Diagnosis present

## 2020-12-11 DIAGNOSIS — W19XXXA Unspecified fall, initial encounter: Secondary | ICD-10-CM

## 2020-12-11 DIAGNOSIS — Y92002 Bathroom of unspecified non-institutional (private) residence single-family (private) house as the place of occurrence of the external cause: Secondary | ICD-10-CM

## 2020-12-11 DIAGNOSIS — M6281 Muscle weakness (generalized): Secondary | ICD-10-CM | POA: Diagnosis not present

## 2020-12-11 DIAGNOSIS — R918 Other nonspecific abnormal finding of lung field: Secondary | ICD-10-CM | POA: Diagnosis not present

## 2020-12-11 DIAGNOSIS — Z741 Need for assistance with personal care: Secondary | ICD-10-CM | POA: Diagnosis not present

## 2020-12-11 DIAGNOSIS — M25551 Pain in right hip: Secondary | ICD-10-CM | POA: Diagnosis not present

## 2020-12-11 DIAGNOSIS — Z79899 Other long term (current) drug therapy: Secondary | ICD-10-CM | POA: Diagnosis not present

## 2020-12-11 DIAGNOSIS — R279 Unspecified lack of coordination: Secondary | ICD-10-CM | POA: Diagnosis not present

## 2020-12-11 DIAGNOSIS — R5381 Other malaise: Secondary | ICD-10-CM | POA: Diagnosis not present

## 2020-12-11 DIAGNOSIS — Z743 Need for continuous supervision: Secondary | ICD-10-CM | POA: Diagnosis not present

## 2020-12-11 DIAGNOSIS — Z90711 Acquired absence of uterus with remaining cervical stump: Secondary | ICD-10-CM

## 2020-12-11 DIAGNOSIS — I700 Atherosclerosis of aorta: Secondary | ICD-10-CM | POA: Diagnosis not present

## 2020-12-11 DIAGNOSIS — S72011A Unspecified intracapsular fracture of right femur, initial encounter for closed fracture: Secondary | ICD-10-CM | POA: Diagnosis not present

## 2020-12-11 DIAGNOSIS — M25572 Pain in left ankle and joints of left foot: Secondary | ICD-10-CM | POA: Diagnosis not present

## 2020-12-11 DIAGNOSIS — D62 Acute posthemorrhagic anemia: Secondary | ICD-10-CM | POA: Diagnosis not present

## 2020-12-11 DIAGNOSIS — Z8673 Personal history of transient ischemic attack (TIA), and cerebral infarction without residual deficits: Secondary | ICD-10-CM | POA: Diagnosis not present

## 2020-12-11 DIAGNOSIS — J189 Pneumonia, unspecified organism: Secondary | ICD-10-CM | POA: Diagnosis present

## 2020-12-11 DIAGNOSIS — Z833 Family history of diabetes mellitus: Secondary | ICD-10-CM

## 2020-12-11 DIAGNOSIS — E876 Hypokalemia: Secondary | ICD-10-CM | POA: Diagnosis present

## 2020-12-11 DIAGNOSIS — Z9049 Acquired absence of other specified parts of digestive tract: Secondary | ICD-10-CM | POA: Diagnosis not present

## 2020-12-11 DIAGNOSIS — S72041A Displaced fracture of base of neck of right femur, initial encounter for closed fracture: Secondary | ICD-10-CM | POA: Diagnosis not present

## 2020-12-11 DIAGNOSIS — S42209A Unspecified fracture of upper end of unspecified humerus, initial encounter for closed fracture: Secondary | ICD-10-CM

## 2020-12-11 DIAGNOSIS — E785 Hyperlipidemia, unspecified: Secondary | ICD-10-CM | POA: Diagnosis not present

## 2020-12-11 DIAGNOSIS — S42309A Unspecified fracture of shaft of humerus, unspecified arm, initial encounter for closed fracture: Secondary | ICD-10-CM | POA: Diagnosis present

## 2020-12-11 DIAGNOSIS — S72001A Fracture of unspecified part of neck of right femur, initial encounter for closed fracture: Secondary | ICD-10-CM

## 2020-12-11 DIAGNOSIS — Z7982 Long term (current) use of aspirin: Secondary | ICD-10-CM

## 2020-12-11 DIAGNOSIS — F01518 Vascular dementia, unspecified severity, with other behavioral disturbance: Secondary | ICD-10-CM | POA: Diagnosis present

## 2020-12-11 DIAGNOSIS — Z66 Do not resuscitate: Secondary | ICD-10-CM | POA: Diagnosis not present

## 2020-12-11 DIAGNOSIS — R0902 Hypoxemia: Secondary | ICD-10-CM | POA: Diagnosis not present

## 2020-12-11 DIAGNOSIS — S42211A Unspecified displaced fracture of surgical neck of right humerus, initial encounter for closed fracture: Secondary | ICD-10-CM | POA: Diagnosis not present

## 2020-12-11 DIAGNOSIS — Z01818 Encounter for other preprocedural examination: Secondary | ICD-10-CM | POA: Diagnosis not present

## 2020-12-11 DIAGNOSIS — S42309D Unspecified fracture of shaft of humerus, unspecified arm, subsequent encounter for fracture with routine healing: Secondary | ICD-10-CM | POA: Diagnosis not present

## 2020-12-11 DIAGNOSIS — F0151 Vascular dementia with behavioral disturbance: Secondary | ICD-10-CM | POA: Diagnosis present

## 2020-12-11 DIAGNOSIS — Z4789 Encounter for other orthopedic aftercare: Secondary | ICD-10-CM | POA: Diagnosis not present

## 2020-12-11 HISTORY — PX: HIP ARTHROPLASTY: SHX981

## 2020-12-11 LAB — LACTIC ACID, PLASMA: Lactic Acid, Venous: 0.8 mmol/L (ref 0.5–1.9)

## 2020-12-11 LAB — BASIC METABOLIC PANEL
Anion gap: 8 (ref 5–15)
BUN: 15 mg/dL (ref 8–23)
CO2: 31 mmol/L (ref 22–32)
Calcium: 8.8 mg/dL — ABNORMAL LOW (ref 8.9–10.3)
Chloride: 101 mmol/L (ref 98–111)
Creatinine, Ser: 0.59 mg/dL (ref 0.44–1.00)
GFR, Estimated: >60 mL/min (ref >60)
Glucose, Bld: 113 mg/dL — ABNORMAL HIGH (ref 70–99)
Potassium: 3.8 mmol/L (ref 3.5–5.1)
Sodium: 140 mmol/L (ref 135–145)

## 2020-12-11 LAB — CBC WITH DIFFERENTIAL/PLATELET
Abs Immature Granulocytes: 0.07 10*3/uL (ref 0.00–0.07)
Basophils Absolute: 0 10*3/uL (ref 0.0–0.1)
Basophils Relative: 0 %
Eosinophils Absolute: 0.1 10*3/uL (ref 0.0–0.5)
Eosinophils Relative: 1 %
HCT: 31.9 % — ABNORMAL LOW (ref 36.0–46.0)
Hemoglobin: 10.4 g/dL — ABNORMAL LOW (ref 12.0–15.0)
Immature Granulocytes: 1 %
Lymphocytes Relative: 6 %
Lymphs Abs: 0.8 10*3/uL (ref 0.7–4.0)
MCH: 31.1 pg (ref 26.0–34.0)
MCHC: 32.6 g/dL (ref 30.0–36.0)
MCV: 95.5 fL (ref 80.0–100.0)
Monocytes Absolute: 0.5 10*3/uL (ref 0.1–1.0)
Monocytes Relative: 4 %
Neutro Abs: 12.3 10*3/uL — ABNORMAL HIGH (ref 1.7–7.7)
Neutrophils Relative %: 88 %
Platelets: 405 10*3/uL — ABNORMAL HIGH (ref 150–400)
RBC: 3.34 MIL/uL — ABNORMAL LOW (ref 3.87–5.11)
RDW: 14 % (ref 11.5–15.5)
WBC: 13.7 10*3/uL — ABNORMAL HIGH (ref 4.0–10.5)
nRBC: 0 % (ref 0.0–0.2)

## 2020-12-11 LAB — SAMPLE TO BLOOD BANK

## 2020-12-11 LAB — RESP PANEL BY RT-PCR (FLU A&B, COVID) ARPGX2
Influenza A by PCR: NEGATIVE
Influenza B by PCR: NEGATIVE
SARS Coronavirus 2 by RT PCR: NEGATIVE

## 2020-12-11 LAB — PROTIME-INR
INR: 1 (ref 0.8–1.2)
Prothrombin Time: 13.6 seconds (ref 11.4–15.2)

## 2020-12-11 LAB — APTT: aPTT: 32 seconds (ref 24–36)

## 2020-12-11 LAB — CBG MONITORING, ED: Glucose-Capillary: 125 mg/dL — ABNORMAL HIGH (ref 70–99)

## 2020-12-11 LAB — TYPE AND SCREEN
ABO/RH(D): O POS
Antibody Screen: NEGATIVE

## 2020-12-11 LAB — CK: Total CK: 76 U/L (ref 38–234)

## 2020-12-11 SURGERY — HEMIARTHROPLASTY, HIP, DIRECT ANTERIOR APPROACH, FOR FRACTURE
Anesthesia: Spinal | Site: Hip | Laterality: Right

## 2020-12-11 MED ORDER — SODIUM CHLORIDE 0.9 % IV SOLN: INTRAVENOUS | Status: DC

## 2020-12-11 MED ORDER — MELATONIN 5 MG PO TABS
30.0000 mg | ORAL_TABLET | Freq: Every day | ORAL | Status: DC
  Administered 2020-12-11 – 2020-12-14 (×4): 30 mg via ORAL
  Filled 2020-12-11 (×4): qty 6

## 2020-12-11 MED ORDER — ACETAMINOPHEN 325 MG PO TABS: 650.0000 mg | ORAL_TABLET | Freq: Four times a day (QID) | ORAL | Status: DC | PRN

## 2020-12-11 MED ORDER — PHENYLEPHRINE HCL (PRESSORS) 10 MG/ML IV SOLN
INTRAVENOUS | Status: DC | PRN
  Administered 2020-12-11: 100 ug via INTRAVENOUS
  Administered 2020-12-11: 50 ug via INTRAVENOUS
  Administered 2020-12-11: 100 ug via INTRAVENOUS

## 2020-12-11 MED ORDER — DONEPEZIL HCL 5 MG PO TABS
10.0000 mg | ORAL_TABLET | Freq: Every day | ORAL | Status: DC
  Administered 2020-12-11 – 2020-12-14 (×4): 10 mg via ORAL
  Filled 2020-12-11 (×4): qty 2

## 2020-12-11 MED ORDER — LACTATED RINGERS IR SOLN
Status: DC | PRN
  Administered 2020-12-11: 3000 mL

## 2020-12-11 MED ORDER — PROPOFOL 500 MG/50ML IV EMUL
INTRAVENOUS | Status: DC | PRN
  Administered 2020-12-11: 30 ug/kg/min via INTRAVENOUS
  Administered 2020-12-11: 40 ug/kg/min via INTRAVENOUS

## 2020-12-11 MED ORDER — OXYCODONE-ACETAMINOPHEN 5-325 MG PO TABS: 1.0000 | ORAL_TABLET | ORAL | Status: DC | PRN

## 2020-12-11 MED ORDER — HYOSCYAMINE SULFATE 0.125 MG PO TBDP
0.3750 mg | ORAL_TABLET | Freq: Two times a day (BID) | ORAL | Status: DC | PRN
  Filled 2020-12-11: qty 3

## 2020-12-11 MED ORDER — BUPIVACAINE LIPOSOME 1.3 % IJ SUSP
INTRAMUSCULAR | Status: AC
  Filled 2020-12-11: qty 20

## 2020-12-11 MED ORDER — CEFAZOLIN SODIUM-DEXTROSE 2-4 GM/100ML-% IV SOLN
2.0000 g | Freq: Four times a day (QID) | INTRAVENOUS | Status: AC
  Administered 2020-12-11 – 2020-12-12 (×2): 2 g via INTRAVENOUS
  Filled 2020-12-11 (×3): qty 100

## 2020-12-11 MED ORDER — RISPERIDONE 0.5 MG PO TABS
0.5000 mg | ORAL_TABLET | Freq: Two times a day (BID) | ORAL | Status: DC
  Administered 2020-12-11 – 2020-12-15 (×9): 0.5 mg via ORAL
  Filled 2020-12-11 (×11): qty 1

## 2020-12-11 MED ORDER — ONDANSETRON HCL 4 MG PO TABS: 4.0000 mg | ORAL_TABLET | Freq: Four times a day (QID) | ORAL | Status: DC | PRN

## 2020-12-11 MED ORDER — VORTIOXETINE HBR 20 MG PO TABS
20.0000 mg | ORAL_TABLET | Freq: Every day | ORAL | Status: DC
  Administered 2020-12-11 – 2020-12-14 (×3): 20 mg via ORAL
  Filled 2020-12-11 (×5): qty 4

## 2020-12-11 MED ORDER — VITAMIN B-12 1000 MCG PO TABS
1000.0000 ug | ORAL_TABLET | Freq: Every day | ORAL | Status: DC
  Administered 2020-12-11 – 2020-12-15 (×5): 1000 ug via ORAL
  Filled 2020-12-11 (×5): qty 1

## 2020-12-11 MED ORDER — NITROGLYCERIN 0.4 MG SL SUBL: 0.4000 mg | SUBLINGUAL_TABLET | SUBLINGUAL | Status: DC | PRN

## 2020-12-11 MED ORDER — CEFAZOLIN SODIUM-DEXTROSE 2-4 GM/100ML-% IV SOLN
2.0000 g | INTRAVENOUS | Status: AC
  Administered 2020-12-11: 2 g via INTRAVENOUS

## 2020-12-11 MED ORDER — ACETAMINOPHEN 10 MG/ML IV SOLN
INTRAVENOUS | Status: DC | PRN
  Administered 2020-12-11: 1000 mg via INTRAVENOUS

## 2020-12-11 MED ORDER — GLYCOPYRROLATE 0.2 MG/ML IJ SOLN
INTRAMUSCULAR | Status: DC | PRN
  Administered 2020-12-11: .2 mg via INTRAVENOUS

## 2020-12-11 MED ORDER — SENNOSIDES-DOCUSATE SODIUM 8.6-50 MG PO TABS: 1.0000 | ORAL_TABLET | Freq: Every evening | ORAL | Status: DC | PRN

## 2020-12-11 MED ORDER — SODIUM CHLORIDE 0.9 % IV SOLN: INTRAVENOUS | Status: DC | PRN

## 2020-12-11 MED ORDER — DOCUSATE SODIUM 100 MG PO CAPS
100.0000 mg | ORAL_CAPSULE | Freq: Two times a day (BID) | ORAL | Status: DC
  Administered 2020-12-11 – 2020-12-14 (×7): 100 mg via ORAL
  Filled 2020-12-11 (×7): qty 1

## 2020-12-11 MED ORDER — ACETAMINOPHEN 10 MG/ML IV SOLN
INTRAVENOUS | Status: AC
  Filled 2020-12-11: qty 100

## 2020-12-11 MED ORDER — TRANEXAMIC ACID 1000 MG/10ML IV SOLN
INTRAVENOUS | Status: AC
  Filled 2020-12-11: qty 10

## 2020-12-11 MED ORDER — DEXMEDETOMIDINE (PRECEDEX) IN NS 20 MCG/5ML (4 MCG/ML) IV SYRINGE
PREFILLED_SYRINGE | INTRAVENOUS | Status: AC
  Filled 2020-12-11: qty 5

## 2020-12-11 MED ORDER — SODIUM CHLORIDE 0.9 % IV SOLN
INTRAVENOUS | Status: DC | PRN
  Administered 2020-12-11: 15 ug/min via INTRAVENOUS

## 2020-12-11 MED ORDER — MORPHINE SULFATE (PF) 2 MG/ML IV SOLN
2.0000 mg | INTRAVENOUS | Status: DC | PRN
Start: 2020-12-11 — End: 2020-12-11
  Administered 2020-12-11: 2 mg via INTRAVENOUS
  Filled 2020-12-11: qty 1

## 2020-12-11 MED ORDER — ASPIRIN EC 81 MG PO TBEC
81.0000 mg | DELAYED_RELEASE_TABLET | Freq: Every day | ORAL | Status: DC
  Administered 2020-12-12 – 2020-12-15 (×4): 81 mg via ORAL
  Filled 2020-12-11 (×4): qty 1

## 2020-12-11 MED ORDER — BISACODYL 10 MG RE SUPP
10.0000 mg | Freq: Every day | RECTAL | Status: DC | PRN
  Administered 2020-12-14: 10 mg via RECTAL
  Filled 2020-12-11: qty 1

## 2020-12-11 MED ORDER — PANTOPRAZOLE SODIUM 40 MG PO TBEC
40.0000 mg | DELAYED_RELEASE_TABLET | Freq: Every day | ORAL | Status: DC
  Administered 2020-12-11 – 2020-12-15 (×5): 40 mg via ORAL
  Filled 2020-12-11 (×5): qty 1

## 2020-12-11 MED ORDER — CEFAZOLIN SODIUM-DEXTROSE 2-4 GM/100ML-% IV SOLN
INTRAVENOUS | Status: AC
  Administered 2020-12-12: 2 g via INTRAVENOUS
  Filled 2020-12-11: qty 100

## 2020-12-11 MED ORDER — ONDANSETRON HCL 4 MG/2ML IJ SOLN
INTRAMUSCULAR | Status: AC
  Filled 2020-12-11: qty 2

## 2020-12-11 MED ORDER — ONDANSETRON HCL 4 MG/2ML IJ SOLN
INTRAMUSCULAR | Status: DC | PRN
  Administered 2020-12-11: 4 mg via INTRAVENOUS

## 2020-12-11 MED ORDER — MORPHINE SULFATE (PF) 2 MG/ML IV SOLN
0.5000 mg | INTRAVENOUS | Status: DC | PRN
  Administered 2020-12-13 – 2020-12-14 (×2): 1 mg via INTRAVENOUS
  Filled 2020-12-11 (×3): qty 1

## 2020-12-11 MED ORDER — ADULT MULTIVITAMIN W/MINERALS CH
1.0000 | ORAL_TABLET | Freq: Every day | ORAL | Status: DC
  Administered 2020-12-12: 1 via ORAL
  Filled 2020-12-11 (×4): qty 1

## 2020-12-11 MED ORDER — MEMANTINE HCL 5 MG PO TABS
2.5000 mg | ORAL_TABLET | Freq: Two times a day (BID) | ORAL | Status: DC
  Administered 2020-12-11 – 2020-12-15 (×8): 2.5 mg via ORAL
  Filled 2020-12-11 (×8): qty 1

## 2020-12-11 MED ORDER — BUPIVACAINE-EPINEPHRINE (PF) 0.5% -1:200000 IJ SOLN
INTRAMUSCULAR | Status: AC
  Filled 2020-12-11: qty 30

## 2020-12-11 MED ORDER — LORATADINE 10 MG PO TABS: 10.0000 mg | ORAL_TABLET | Freq: Every day | ORAL | Status: DC | PRN

## 2020-12-11 MED ORDER — METHOCARBAMOL 500 MG PO TABS
500.0000 mg | ORAL_TABLET | Freq: Three times a day (TID) | ORAL | Status: DC | PRN
  Administered 2020-12-12 (×2): 500 mg via ORAL
  Filled 2020-12-11 (×3): qty 1

## 2020-12-11 MED ORDER — ENOXAPARIN SODIUM 40 MG/0.4ML IJ SOSY
40.0000 mg | PREFILLED_SYRINGE | INTRAMUSCULAR | Status: DC
  Administered 2020-12-12 – 2020-12-15 (×4): 40 mg via SUBCUTANEOUS
  Filled 2020-12-11 (×4): qty 0.4

## 2020-12-11 MED ORDER — LISINOPRIL 5 MG PO TABS
5.0000 mg | ORAL_TABLET | Freq: Every day | ORAL | Status: DC
  Administered 2020-12-11 – 2020-12-15 (×5): 5 mg via ORAL
  Filled 2020-12-11 (×5): qty 1

## 2020-12-11 MED ORDER — BUPIVACAINE HCL (PF) 0.5 % IJ SOLN
INTRAMUSCULAR | Status: DC | PRN
  Administered 2020-12-11: 2.5 mL

## 2020-12-11 MED ORDER — SODIUM CHLORIDE 0.9 % IV SOLN
500.0000 mg | Freq: Once | INTRAVENOUS | Status: AC
  Administered 2020-12-11: 500 mg via INTRAVENOUS
  Filled 2020-12-11: qty 500

## 2020-12-11 MED ORDER — ADULT MULTIVITAMIN W/MINERALS CH
1.0000 | ORAL_TABLET | Freq: Every day | ORAL | Status: DC
  Administered 2020-12-12 – 2020-12-15 (×5): 1 via ORAL
  Filled 2020-12-11 (×2): qty 1

## 2020-12-11 MED ORDER — ALBUTEROL SULFATE (2.5 MG/3ML) 0.083% IN NEBU: 3.0000 mL | INHALATION_SOLUTION | RESPIRATORY_TRACT | Status: DC | PRN

## 2020-12-11 MED ORDER — ATORVASTATIN CALCIUM 20 MG PO TABS
80.0000 mg | ORAL_TABLET | Freq: Every day | ORAL | Status: DC
  Administered 2020-12-12 – 2020-12-15 (×4): 80 mg via ORAL
  Filled 2020-12-11 (×4): qty 4

## 2020-12-11 MED ORDER — DIPHENHYDRAMINE HCL 12.5 MG/5ML PO ELIX
12.5000 mg | ORAL_SOLUTION | ORAL | Status: DC | PRN
  Administered 2020-12-12: 25 mg via ORAL
  Filled 2020-12-11 (×2): qty 10

## 2020-12-11 MED ORDER — PROPOFOL 10 MG/ML IV BOLUS
INTRAVENOUS | Status: AC
  Filled 2020-12-11: qty 20

## 2020-12-11 MED ORDER — HYDRALAZINE HCL 20 MG/ML IJ SOLN: 5.0000 mg | INTRAMUSCULAR | Status: DC | PRN

## 2020-12-11 MED ORDER — SODIUM CHLORIDE FLUSH 0.9 % IV SOLN
INTRAVENOUS | Status: AC
  Filled 2020-12-11: qty 40

## 2020-12-11 MED ORDER — SODIUM CHLORIDE 0.9 % IV SOLN
1.0000 g | Freq: Once | INTRAVENOUS | Status: AC
  Administered 2020-12-11: 1 g via INTRAVENOUS
  Filled 2020-12-11: qty 10

## 2020-12-11 MED ORDER — SODIUM CHLORIDE 0.9 % IV SOLN
INTRAVENOUS | Status: DC | PRN
  Administered 2020-12-11: 60 mL

## 2020-12-11 MED ORDER — TRAMADOL HCL 50 MG PO TABS
50.0000 mg | ORAL_TABLET | Freq: Four times a day (QID) | ORAL | Status: DC | PRN
  Administered 2020-12-11 – 2020-12-15 (×2): 50 mg via ORAL
  Filled 2020-12-11 (×2): qty 1

## 2020-12-11 MED ORDER — ACETAMINOPHEN 325 MG PO TABS
325.0000 mg | ORAL_TABLET | Freq: Four times a day (QID) | ORAL | Status: DC | PRN
  Administered 2020-12-13 – 2020-12-14 (×2): 650 mg via ORAL
  Filled 2020-12-11 (×2): qty 2

## 2020-12-11 MED ORDER — FENTANYL CITRATE (PF) 100 MCG/2ML IJ SOLN
INTRAMUSCULAR | Status: AC
  Filled 2020-12-11: qty 2

## 2020-12-11 MED ORDER — SODIUM CHLORIDE 0.9 % IV SOLN
1.0000 g | INTRAVENOUS | Status: DC
  Administered 2020-12-12 – 2020-12-15 (×4): 1 g via INTRAVENOUS
  Filled 2020-12-11 (×2): qty 10
  Filled 2020-12-11 (×2): qty 1

## 2020-12-11 MED ORDER — DIVALPROEX SODIUM 125 MG PO DR TAB
125.0000 mg | DELAYED_RELEASE_TABLET | Freq: Three times a day (TID) | ORAL | Status: DC
  Administered 2020-12-11 – 2020-12-15 (×12): 125 mg via ORAL
  Filled 2020-12-11 (×13): qty 1

## 2020-12-11 MED ORDER — RISPERIDONE 1 MG PO TABS
1.0000 mg | ORAL_TABLET | Freq: Every day | ORAL | Status: DC
  Administered 2020-12-12 – 2020-12-15 (×4): 1 mg via ORAL
  Filled 2020-12-11 (×4): qty 1

## 2020-12-11 MED ORDER — TRANEXAMIC ACID 1000 MG/10ML IV SOLN
INTRAVENOUS | Status: DC | PRN
  Administered 2020-12-11: 1000 mg via TOPICAL

## 2020-12-11 MED ORDER — 0.9 % SODIUM CHLORIDE (POUR BTL) OPTIME
TOPICAL | Status: DC | PRN
  Administered 2020-12-11: 100 mL

## 2020-12-11 MED ORDER — CALCIUM CARBONATE 1250 (500 CA) MG PO TABS
1.0000 | ORAL_TABLET | Freq: Every day | ORAL | Status: DC
  Administered 2020-12-12 – 2020-12-15 (×4): 500 mg via ORAL
  Filled 2020-12-11 (×5): qty 1

## 2020-12-11 MED ORDER — ONDANSETRON HCL 4 MG/2ML IJ SOLN
4.0000 mg | Freq: Three times a day (TID) | INTRAMUSCULAR | Status: DC | PRN
  Administered 2020-12-11: 4 mg via INTRAVENOUS
  Filled 2020-12-11: qty 2

## 2020-12-11 MED ORDER — MAGNESIUM HYDROXIDE 400 MG/5ML PO SUSP: 30.0000 mL | Freq: Every day | ORAL | Status: DC | PRN

## 2020-12-11 MED ORDER — ONDANSETRON HCL 4 MG/2ML IJ SOLN: 4.0000 mg | Freq: Once | INTRAMUSCULAR | Status: DC | PRN

## 2020-12-11 MED ORDER — FLEET ENEMA 7-19 GM/118ML RE ENEM: 1.0000 | ENEMA | Freq: Once | RECTAL | Status: DC | PRN

## 2020-12-11 MED ORDER — ENSURE ENLIVE PO LIQD
237.0000 mL | Freq: Two times a day (BID) | ORAL | Status: DC
  Administered 2020-12-12 – 2020-12-14 (×5): 237 mL via ORAL
  Filled 2020-12-11 (×2): qty 237

## 2020-12-11 MED ORDER — PROPOFOL 500 MG/50ML IV EMUL
INTRAVENOUS | Status: AC
  Filled 2020-12-11: qty 50

## 2020-12-11 MED ORDER — HYDROCODONE-ACETAMINOPHEN 5-325 MG PO TABS
1.0000 | ORAL_TABLET | ORAL | Status: DC | PRN
  Administered 2020-12-11: 2 via ORAL
  Filled 2020-12-11: qty 2

## 2020-12-11 MED ORDER — SODIUM CHLORIDE 0.9 % IV SOLN
500.0000 mg | INTRAVENOUS | Status: DC
  Administered 2020-12-12 – 2020-12-15 (×4): 500 mg via INTRAVENOUS
  Filled 2020-12-11 (×4): qty 500

## 2020-12-11 MED ORDER — DM-GUAIFENESIN ER 30-600 MG PO TB12
1.0000 | ORAL_TABLET | Freq: Two times a day (BID) | ORAL | Status: DC | PRN
  Filled 2020-12-11: qty 1

## 2020-12-11 MED ORDER — ACETAMINOPHEN 500 MG PO TABS
500.0000 mg | ORAL_TABLET | Freq: Four times a day (QID) | ORAL | Status: AC
  Administered 2020-12-11 – 2020-12-12 (×3): 500 mg via ORAL
  Filled 2020-12-11 (×4): qty 1

## 2020-12-11 MED ORDER — METOCLOPRAMIDE HCL 5 MG/ML IJ SOLN: 5.0000 mg | Freq: Three times a day (TID) | INTRAMUSCULAR | Status: DC | PRN

## 2020-12-11 MED ORDER — ONDANSETRON HCL 4 MG/2ML IJ SOLN
4.0000 mg | Freq: Four times a day (QID) | INTRAMUSCULAR | Status: DC | PRN
  Administered 2020-12-12: 4 mg via INTRAVENOUS
  Filled 2020-12-11: qty 2

## 2020-12-11 MED ORDER — RISPERIDONE 0.5 MG PO TABS: 0.5000 mg | ORAL_TABLET | ORAL | Status: DC

## 2020-12-11 MED ORDER — FENTANYL CITRATE PF 50 MCG/ML IJ SOSY
25.0000 ug | PREFILLED_SYRINGE | INTRAMUSCULAR | Status: DC | PRN
  Administered 2020-12-11: 25 ug via INTRAVENOUS
  Filled 2020-12-11: qty 1

## 2020-12-11 MED ORDER — DEXMEDETOMIDINE (PRECEDEX) IN NS 20 MCG/5ML (4 MCG/ML) IV SYRINGE
PREFILLED_SYRINGE | INTRAVENOUS | Status: DC | PRN
  Administered 2020-12-11: 4 ug via INTRAVENOUS
  Administered 2020-12-11 (×2): 8 ug via INTRAVENOUS

## 2020-12-11 MED ORDER — METOCLOPRAMIDE HCL 10 MG PO TABS: 5.0000 mg | ORAL_TABLET | Freq: Three times a day (TID) | ORAL | Status: DC | PRN

## 2020-12-11 MED ORDER — VITAMIN D 25 MCG (1000 UNIT) PO TABS
1000.0000 [IU] | ORAL_TABLET | Freq: Every day | ORAL | Status: DC
  Administered 2020-12-12 – 2020-12-15 (×4): 1000 [IU] via ORAL
  Filled 2020-12-11 (×4): qty 1

## 2020-12-11 MED ORDER — FENTANYL CITRATE (PF) 100 MCG/2ML IJ SOLN: 25.0000 ug | INTRAMUSCULAR | Status: DC | PRN

## 2020-12-11 MED ORDER — BUPIVACAINE HCL (PF) 0.5 % IJ SOLN
INTRAMUSCULAR | Status: AC
  Filled 2020-12-11: qty 10

## 2020-12-11 MED ORDER — VORTIOXETINE HBR 5 MG PO TABS: 20.0000 mg | ORAL_TABLET | Freq: Every day | ORAL | Status: DC

## 2020-12-11 MED ORDER — FENTANYL CITRATE (PF) 100 MCG/2ML IJ SOLN
INTRAMUSCULAR | Status: AC
  Administered 2020-12-11: 25 ug via INTRAVENOUS
  Filled 2020-12-11: qty 2

## 2020-12-11 MED ORDER — FENTANYL CITRATE (PF) 100 MCG/2ML IJ SOLN
INTRAMUSCULAR | Status: DC | PRN
  Administered 2020-12-11 (×2): 25 ug via INTRAVENOUS
  Administered 2020-12-11: 50 ug via INTRAVENOUS

## 2020-12-11 MED ORDER — PROPRANOLOL HCL 20 MG PO TABS
40.0000 mg | ORAL_TABLET | Freq: Two times a day (BID) | ORAL | Status: DC
  Administered 2020-12-11 – 2020-12-15 (×8): 40 mg via ORAL
  Filled 2020-12-11 (×8): qty 2

## 2020-12-11 MED ORDER — GLYCOPYRROLATE 0.2 MG/ML IJ SOLN
INTRAMUSCULAR | Status: AC
  Filled 2020-12-11: qty 1

## 2020-12-11 SURGICAL SUPPLY — 66 items
BAG DECANTER FOR FLEXI CONT (MISCELLANEOUS) IMPLANT
BLADE SAGITTAL WIDE XTHICK NO (BLADE) ×2 IMPLANT
BLADE SURG SZ20 CARB STEEL (BLADE) ×2 IMPLANT
BNDG COHESIVE 6X5 TAN ST LF (GAUZE/BANDAGES/DRESSINGS) ×2 IMPLANT
BOWL CEMENT MIXING ADV NOZZLE (MISCELLANEOUS) IMPLANT
CHLORAPREP W/TINT 26 (MISCELLANEOUS) ×4 IMPLANT
DECANTER SPIKE VIAL GLASS SM (MISCELLANEOUS) ×4 IMPLANT
DRAPE 3/4 80X56 (DRAPES) ×2 IMPLANT
DRAPE IMP U-DRAPE 54X76 (DRAPES) ×4 IMPLANT
DRAPE INCISE IOBAN 66X60 STRL (DRAPES) ×2 IMPLANT
DRAPE SURG 17X11 SM STRL (DRAPES) ×2 IMPLANT
DRAPE SURG 17X23 STRL (DRAPES) ×2 IMPLANT
DRSG OPSITE POSTOP 4X12 (GAUZE/BANDAGES/DRESSINGS) ×2 IMPLANT
DRSG OPSITE POSTOP 4X14 (GAUZE/BANDAGES/DRESSINGS) IMPLANT
DRSG OPSITE POSTOP 4X8 (GAUZE/BANDAGES/DRESSINGS) ×2 IMPLANT
ELECT BLADE 6.5 EXT (BLADE) ×2 IMPLANT
ELECT CAUTERY BLADE 6.4 (BLADE) ×2 IMPLANT
ELECT REM PT RETURN 9FT ADLT (ELECTROSURGICAL) ×2
ELECTRODE REM PT RTRN 9FT ADLT (ELECTROSURGICAL) ×1 IMPLANT
GAUZE 4X4 16PLY ~~LOC~~+RFID DBL (SPONGE) ×2 IMPLANT
GAUZE PACK 2X3YD (PACKING) IMPLANT
GLOVE SRG 8 PF TXTR STRL LF DI (GLOVE) IMPLANT
GLOVE SURG ENC MOIS LTX SZ8 (GLOVE) ×6 IMPLANT
GLOVE SURG ENC TEXT LTX SZ7.5 (GLOVE) IMPLANT
GLOVE SURG UNDER LTX SZ8 (GLOVE) ×2 IMPLANT
GLOVE SURG UNDER POLY LF SZ8 (GLOVE)
GOWN STRL REUS W/ TWL LRG LVL3 (GOWN DISPOSABLE) ×1 IMPLANT
GOWN STRL REUS W/ TWL XL LVL3 (GOWN DISPOSABLE) ×1 IMPLANT
GOWN STRL REUS W/TWL LRG LVL3 (GOWN DISPOSABLE) ×2
GOWN STRL REUS W/TWL XL LVL3 (GOWN DISPOSABLE) ×2
HEAD ENDO II MOD SZ 46 (Orthopedic Implant) ×2 IMPLANT
HOLDER FOLEY CATH W/STRAP (MISCELLANEOUS) ×2 IMPLANT
HOOD PEEL AWAY FLYTE STAYCOOL (MISCELLANEOUS) ×4 IMPLANT
INSERT TAPER ENDO II -3 (Orthopedic Implant) ×2 IMPLANT
IV NS 100ML SINGLE PACK (IV SOLUTION) IMPLANT
IV NS IRRIG 3000ML ARTHROMATIC (IV SOLUTION) ×4 IMPLANT
LABEL OR SOLS (LABEL) ×2 IMPLANT
MANIFOLD NEPTUNE II (INSTRUMENTS) ×2 IMPLANT
NDL SAFETY ECLIPSE 18X1.5 (NEEDLE) ×1 IMPLANT
NEEDLE FILTER BLUNT 18X 1/2SAF (NEEDLE) ×1
NEEDLE FILTER BLUNT 18X1 1/2 (NEEDLE) ×1 IMPLANT
NEEDLE HYPO 18GX1.5 SHARP (NEEDLE) ×2
NEEDLE SPNL 20GX3.5 QUINCKE YW (NEEDLE) ×2 IMPLANT
NS IRRIG 1000ML POUR BTL (IV SOLUTION) ×2 IMPLANT
PACK HIP PROSTHESIS (MISCELLANEOUS) ×2 IMPLANT
PULSAVAC PLUS IRRIG FAN TIP (DISPOSABLE) ×2
SPONGE T-LAP 18X18 ~~LOC~~+RFID (SPONGE) ×8 IMPLANT
STAPLER SKIN PROX 35W (STAPLE) ×2 IMPLANT
STEM COLLARLESS RED 12X120X130 (Stem) ×2 IMPLANT
STRAP SAFETY 5IN WIDE (MISCELLANEOUS) ×2 IMPLANT
SUT ETHIBOND 2 V 37 (SUTURE) ×2 IMPLANT
SUT VIC AB 1 CT1 36 (SUTURE) IMPLANT
SUT VIC AB 2-0 CT1 (SUTURE) ×4 IMPLANT
SUT VIC AB 2-0 CT1 27 (SUTURE)
SUT VIC AB 2-0 CT1 TAPERPNT 27 (SUTURE) IMPLANT
SUT VICRYL 1-0 27IN ABS (SUTURE) ×4
SUTURE VICRYL 1-0 27IN ABS (SUTURE) ×2 IMPLANT
SYR 10ML LL (SYRINGE) ×2 IMPLANT
SYR 30ML LL (SYRINGE) ×6 IMPLANT
SYR TB 1ML 27GX1/2 LL (SYRINGE) IMPLANT
TAPE TRANSPORE STRL 2 31045 (GAUZE/BANDAGES/DRESSINGS) ×2 IMPLANT
TIP BRUSH PULSAVAC PLUS 24.33 (MISCELLANEOUS) IMPLANT
TIP FAN IRRIG PULSAVAC PLUS (DISPOSABLE) ×1 IMPLANT
TRAP FLUID SMOKE EVACUATOR (MISCELLANEOUS) ×2 IMPLANT
TRAY FOLEY MTR SLVR 16FR STAT (SET/KITS/TRAYS/PACK) ×2 IMPLANT
WATER STERILE IRR 500ML POUR (IV SOLUTION) ×2 IMPLANT

## 2020-12-11 NOTE — ED Triage Notes (Addendum)
Pt to ER via [facility] from home. Per family patient was using the bathroom and fell between 2-6am. Pt denies LOC/ hitting head but has dementia and is unable to recall what happened. No blood thinner usage. Unable to recall how long she was laying in the floor. Complaints of right hip and leg pain, edema present to bilateral lower extremities. Shortening to right leg.   Pt with previous right humeral fracture with sling present. Husband reports similar events causing that injury. Pt with productive cough.   EM VS- hr 70, O2 sats 95%, bp 140/66, cbg 215 (new diabetic, not on medications). Temp 98.5 oral

## 2020-12-11 NOTE — ED Notes (Signed)
Patient transported to X-ray 

## 2020-12-11 NOTE — Consult Note (Signed)
ORTHOPAEDIC CONSULTATION  REQUESTING PHYSICIAN: Lorretta Harp, MD  Chief Complaint:   Right hip pain  History of Present Illness: Crystal Newton is a 75 y.o. female with multiple medical problems including dementia, diabetes, hyperlipidemia, hypertension, status post strokes/TIAs, anxiety/depression, and headaches who normally lives with her husband and who is being treated by my office for a proximal humerus fracture nonsurgically and who lives with her husband was in her usual state of health until she was found early this morning after apparently having fallen last night while going to the bathroom.  She was unable to get up due to right hip pain.  Therefore, the patient was brought to the emergency room where x-rays of her right hip were obtained and demonstrated a displaced right femoral neck fracture.  The patient denies striking her head or losing consciousness.  She also denies any other associated injuries.  However, she is a poor historian so it is difficult to determine whether she had any lightheadedness, dizziness, chest pain, shortness of breath, or other symptoms which may have precipitated her fall.  Past Medical History:  Diagnosis Date   Anxiety    Arthritis    Depression    Diabetes mellitus    diet controlled   Headache    migraine headache   History of kidney stones    Hyperlipidemia    Hypertension    Memory difficulties    Sleep disorder    Stroke [facility])    TIA (transient ischemic attack)    found on MRI.   Past Surgical History:  Procedure Laterality Date   ABDOMINAL HYSTERECTOMY     BREAST BIOPSY     CATARACT EXTRACTION W/PHACO Right 10/17/2019   Procedure: CATARACT EXTRACTION PHACO AND INTRAOCULAR LENS PLACEMENT (IOC) RIGHT DIABETIC VISION BLUE 9.96  00:59.0;  Surgeon: Elliot Cousin, MD;  Location: [facility];  Service: Ophthalmology;  Laterality: Right;  Diabetic - diet controlled   DILATION  AND CURETTAGE OF UTERUS     ESOPHAGOGASTRODUODENOSCOPY (EGD) WITH PROPOFOL N/A 09/21/2020   Procedure: ESOPHAGOGASTRODUODENOSCOPY (EGD) WITH PROPOFOL;  Surgeon: Regis Bill, MD;  Location: [facility] END[facility];  Service: Endoscopy;  Laterality: N/A;  REQUESTS AM   PARTIAL COLECTOMY  1996   ruptured abcess for diverticulitis   PARTIAL HYSTERECTOMY     uterine prolapse   right parotid     TONSILLECTOMY     Social History   Socioeconomic History   Marital status: Married    Spouse name: Not on file   Number of children: Not on file   Years of education: Not on file   Highest education level: Not on file  Occupational History   Occupation: day care    Employer: RETIRED  Tobacco Use   Smoking status: Never   Smokeless tobacco: Never  Vaping Use   Vaping Use: Never used  Substance and Sexual Activity   Alcohol use: No    Alcohol/week: 0.0 standard drinks   Drug use: No   Sexual activity: Not Currently  Other Topics Concern   Not on file  Social History Narrative   Regular exercise--no, but walking on weekends.      Diet-- healthy, low carb, working on weight loss   No living will. Full Code. (Reviewed 2013)   Social Determinants of Health   Financial Resource Strain: Not on file  Food Insecurity: Not on file  Transportation Needs: Not on file  Physical Activity: Not on file  Stress: Not on file  Social Connections: Not on file  Family History  Problem Relation Age of Onset   Alzheimer's disease Mother    Diabetes Maternal Grandfather    Coronary artery disease Maternal Grandfather    No Known Allergies Prior to Admission medications   Medication Sig Start Date End Date Taking? Authorizing Provider  aspirin 81 MG EC tablet Take 81 mg by mouth daily.   Yes [provider]  atorvastatin (LIPITOR) 80 MG tablet TAKE 1 TABLET BY MOUTH EVERY DAY 12/02/19  Yes Bedsole, Amy E, MD  calcium carbonate (CALCIUM 600) 1500 (600 Ca) MG TABS tablet Take 1 tablet by mouth  daily with breakfast.   Yes [provider]  cetirizine (ZYRTEC) 10 MG tablet Take 10 mg by mouth at bedtime.   Yes [provider]  divalproex (DEPAKOTE) 125 MG DR tablet Take 125 mg by mouth 3 (three) times daily.   Yes [provider]  donepezil (ARICEPT) 10 MG tablet TAKE 1 TABLET BY MOUTH EVERY DAY EVERY NIGHT 11/18/19  Yes Bedsole, Amy E, MD  hyoscyamine (LEVBID) 0.375 MG 12 hr tablet Take 0.375 mg by mouth every 12 (twelve) hours as needed for cramping.   Yes [provider]  lisinopril (ZESTRIL) 5 MG tablet TAKE 1 TABLET BY MOUTH EVERY DAY 01/02/20  Yes Bedsole, Amy E, MD  Melatonin 10 MG TABS Take 30 mg by mouth at bedtime.   Yes [provider]  memantine (NAMENDA) 10 MG tablet Take 0.5 tablets (5 mg total) by mouth 2 (two) times daily. 10/15/19  Yes Bedsole, Amy E, MD  Multiple Vitamins-Minerals (CENTRUM SILVER PO) Take 1 tablet by mouth daily.   Yes [provider]  nitroGLYCERIN (NITROSTAT) 0.3 MG SL tablet Place 1 tablet (0.3 mg total) under the tongue every 5 (five) minutes as needed for chest pain (call your doctor if pain persists after 3 doses). 05/28/20 05/28/21 Yes Shaune Pollack, MD  omeprazole (PRILOSEC) 20 MG capsule Take 40 mg by mouth daily with supper.   Yes [provider]  propranolol (INDERAL) 40 MG tablet TAKE 1 TABLET BY MOUTH TWICE A DAY 06/15/20  Yes Bedsole, Amy E, MD  risperiDONE (RISPERDAL) 0.5 MG tablet Take 0.5-1 mg by mouth See admin instructions. Take 1 tablet (0.5mg ) by mouth every morning, take 2 tablets (1mg ) by mouth every afternoon and take 1 tablet (0.5mg ) by mouth at bedtime   Yes [provider]  traMADol (ULTRAM) 50 MG tablet Take 50-100 mg by mouth every 6 (six) hours as needed for moderate pain.   Yes [provider]  vitamin B-12 (CYANOCOBALAMIN) 1000 MCG tablet Take 1,000 mcg by mouth daily.   Yes [provider]  Vitamin D3 (VITAMIN D) 25 MCG tablet Take 1,000 Units  by mouth daily.   Yes [provider]  vortioxetine HBr (TRINTELLIX) 20 MG TABS tablet Take 20 mg by mouth daily with supper.   Yes [provider]  meloxicam (MOBIC) 15 MG tablet TAKE 1 TABLET BY MOUTH EVERY DAY Patient not taking: No sig reported 12/02/19   Bedsole, Amy E, MD  pantoprazole (PROTONIX) 40 MG tablet TAKE 1 TABLET BY MOUTH EVERY DAY Patient not taking: No sig reported 01/21/20   03/22/20, MD   CT HEAD WO CONTRAST ([facility])  Result Date: 12/11/2020 CLINICAL DATA:  Head trauma, minor (Age >= 65y) EXAM: CT HEAD WITHOUT CONTRAST TECHNIQUE: Contiguous axial images were obtained from the base of the skull through the vertex without intravenous contrast. COMPARISON:  11/24/2020 FINDINGS: Brain: No evidence of  acute infarction, hemorrhage, hydrocephalus, extra-axial collection or mass lesion/mass effect. Mild low-density changes within the periventricular and subcortical white matter compatible with chronic microvascular ischemic change. Mild diffuse cerebral volume loss. Vascular: Atherosclerotic calcifications involving the large vessels of the skull base. No unexpected hyperdense vessel. Skull: Normal. Negative for fracture or focal lesion. Sinuses/Orbits: No acute finding. Other: None. IMPRESSION: 1. No acute intracranial findings. 2. Chronic microvascular ischemic change and cerebral volume loss. Electronically Signed   By: Duanne GuessNicholas  Plundo D.O.   On: 12/11/2020 12:34   DG Chest Port 1 View  Result Date: 12/11/2020 CLINICAL DATA:  75 year old female with history of right-sided hip pain after a fall in the bathroom today. EXAM: PORTABLE CHEST 1 VIEW COMPARISON:  Chest x-ray 08/18/2020. FINDINGS: Several patchy opacities are noted in the right upper lobe, including a band like opacity immediately above the minor fissure which appears superiorly displaced. Left lung is clear. No pleural effusions. No pneumothorax. No evidence of pulmonary edema. Heart size is normal. Upper  mediastinal contours are within normal limits allowing for patient rotation the left. Atherosclerotic calcifications in the thoracic aorta. IMPRESSION: 1. Patchy opacities in the right upper lobe which likely reflect a combination of airspace consolidation and atelectasis concerning for developing bronchopneumonia. Followup PA and lateral chest X-ray is recommended in 3-4 weeks following trial of antibiotic therapy to ensure resolution and exclude underlying malignancy. 2. Aortic atherosclerosis. Electronically Signed   By: Trudie Reedaniel  Entrikin M.D.   On: 12/11/2020 09:27   DG Hip Unilat  With Pelvis 2-3 Views Right  Result Date: 12/11/2020 CLINICAL DATA:  Right hip pain.  Fall. EXAM: DG HIP (WITH OR WITHOUT PELVIS) 2-3V RIGHT COMPARISON:  None. FINDINGS: Acute displaced fracture involving the proximal right femur. The fracture appears to be at the junction of the femoral head and neck and suggestive for a subcapital fracture. The right femoral neck is superiorly displaced. Right femoral head appears to be located. Pelvic bony ring is intact. Normal appearance of the left hip. IMPRESSION: Displaced right subcapital femur fracture. Electronically Signed   By: Richarda OverlieAdam  Henn M.D.   On: 12/11/2020 09:26    Positive ROS: All other systems have been reviewed and were otherwise negative with the exception of those mentioned in the HPI and as above.  Physical Exam: General:  Alert, no acute distress Psychiatric:  Patient is competent for consent with normal mood and affect   Cardiovascular:  No pedal edema Respiratory:  No wheezing, non-labored breathing GI:  Abdomen is soft and non-tender Skin:  No lesions in the area of chief complaint Neurologic:  Sensation intact distally Lymphatic:  No axillary or cervical lymphadenopathy  Orthopedic Exam:  Orthopedic examination is limited to the right hip and lower extremity.  Skin inspection around the right hip is unremarkable.  The right lower extremity is somewhat  shortened and externally rotated as compared to the left.  No swelling, erythema, ecchymosis, abrasions, or other skin abnormalities are identified.  She has mild tenderness to palpation over the lateral aspect of the right hip.  She has more severe pain with any attempted active or passive motion of the right hip.  She is neurovascularly intact to the right lower extremity, demonstrating the ability to dorsiflex and plantarflex her toes and ankle.  Sensations intact to light touch to all distributions.  She has good capillary refill to her right foot.  X-rays:  Recent x-rays of the pelvis and right hip are available for review and have been reviewed by myself.  These films demonstrate a displaced subcapital fracture of the right hip.  No significant degenerative changes are noted.  No lytic lesions or other acute bony abnormalities are identified.  Assessment: Closed displaced right femoral neck fracture.  Plan: The treatment options, including both surgical and nonsurgical choices, have been discussed in detail with the patient and her husband. The patient's husband, who is the patient's power of attorney and healthcare proxy, would like to proceed with a surgical procedure, specifically a right hip hemiarthroplasty. The risks (including bleeding, infection, nerve and/or blood vessel injury, persistent or recurrent pain, loosening or failure of the components, leg length inequality, dislocation, need for further surgery, blood clots, strokes, heart attacks or arrhythmias, pneumonia, etc.) and benefits of the surgical procedure were discussed. The patient's husband states his understanding on her behalf and agrees to proceed. He also agrees to a blood transfusion if necessary. A formal written consent will be obtained by the nursing staff.  Thank you for asking me to participate in the care of this most pleasant yet unfortunate woman.  I will be happy to follow her with you.   Maryagnes Amos,  MD  Beeper #:  [phone]  12/11/2020 3:11 PM

## 2020-12-11 NOTE — Transfer of Care (Signed)
Immediate Anesthesia Transfer of Care Note  Patient: Crystal Newton  Procedure(s) Performed: ARTHROPLASTY BIPOLAR HIP (HEMIARTHROPLASTY) (Right: Hip)  Patient Location: PACU  Anesthesia Type:Spinal  Level of Consciousness: drowsy  Airway & Oxygen Therapy: Patient Spontanous Breathing and Patient connected to face mask oxygen  Post-op Assessment: Report given to RN and Post -op Vital signs reviewed and stable  Post vital signs: Reviewed and stable  Last Vitals:  Vitals Value Taken Time  BP 128/57 12/11/20 1708  Temp    Pulse 87 12/11/20 1711  Resp 15 12/11/20 1711  SpO2 100 % 12/11/20 1711  Vitals shown include unvalidated device data.  Last Pain:  Vitals:   12/11/20 1426  TempSrc: Temporal  PainSc: 5          Complications: No notable events documented.

## 2020-12-11 NOTE — ED Provider Notes (Signed)
[facility] Emergency Department Provider Note   ____________________________________________    I have reviewed the triage vital signs and the nursing notes.   HISTORY  Chief Complaint Fall  History limited by dementia   HPI Crystal Newton is a 75 y.o. female with history of diabetes, recently treated for right humeral fracture status post fall presents after another fall.  This occurred last night.  Complains of right hip pain, no back pain, no knee pain.  No head injury reported.  No neck pain.  Family also reports patient has mild cough recently.  Humeral fracture being cared for by Dr. Joice Lofts of [facility]  Past Medical History:  Diagnosis Date   Anxiety    Arthritis    Depression    Diabetes mellitus    diet controlled   Headache    migraine headache   History of kidney stones    Hyperlipidemia    Hypertension    Memory difficulties    Sleep disorder    Stroke [facility])    TIA (transient ischemic attack)    found on MRI.    Patient Active Problem List   Diagnosis Date Noted   Closed right hip fracture (HCC) 12/11/2020   Cold intolerance 12/12/2019   Acute pain of right shoulder 12/12/2019   Acute thoracic back pain 12/14/2018   Weight loss, unintentional 09/28/2018   Chronic insomnia 03/14/2017   Digital mucinous cyst of finger of right hand 09/09/2016   Bilateral hip bursitis 03/08/2016   Counseling regarding end of life decision making 02/03/2015   Dementia, vascular, mixed, with behavioral disturbance (HCC) 06/14/2012   OSTEOARTHROSIS UNSPEC WHETHER GEN/LOCALIZED HAND 01/14/2008   Diabetes mellitus with no complication (HCC) 12/21/2006   Hyperlipidemia LDL goal <100 12/21/2006   Generalized anxiety disorder 12/21/2006   MDD (major depressive disorder), recurrent episode, severe (HCC) 12/21/2006   COMMON MIGRAINE 12/21/2006   Essential hypertension, benign 12/21/2006   PUD 12/21/2006    Past Surgical History:  Procedure  Laterality Date   ABDOMINAL HYSTERECTOMY     BREAST BIOPSY     CATARACT EXTRACTION W/PHACO Right 10/17/2019   Procedure: CATARACT EXTRACTION PHACO AND INTRAOCULAR LENS PLACEMENT (IOC) RIGHT DIABETIC VISION BLUE 9.96  00:59.0;  Surgeon: Elliot Cousin, MD;  Location: [facility];  Service: Ophthalmology;  Laterality: Right;  Diabetic - diet controlled   DILATION AND CURETTAGE OF UTERUS     ESOPHAGOGASTRODUODENOSCOPY (EGD) WITH PROPOFOL N/A 09/21/2020   Procedure: ESOPHAGOGASTRODUODENOSCOPY (EGD) WITH PROPOFOL;  Surgeon: Regis Bill, MD;  Location: [facility];  Service: Endoscopy;  Laterality: N/A;  REQUESTS AM   PARTIAL COLECTOMY  1996   ruptured abcess for diverticulitis   PARTIAL HYSTERECTOMY     uterine prolapse   right parotid     TONSILLECTOMY      Prior to Admission medications   Medication Sig Start Date End Date Taking? Authorizing Provider  ASPIRIN 81 PO Take by mouth daily.    [provider]  atorvastatin (LIPITOR) 80 MG tablet TAKE 1 TABLET BY MOUTH EVERY DAY 12/02/19   Bedsole, Amy E, MD  benzonatate (TESSALON) 200 MG capsule Take 200 mg by mouth 3 (three) times daily as needed for cough.    [provider]  busPIRone (BUSPAR) 5 MG tablet Take 5 mg by mouth 2 (two) times daily.    [provider]  cetirizine (ZYRTEC) 10 MG tablet Take 10 mg by mouth daily.    [provider]  divalproex (DEPAKOTE)  125 MG DR tablet Take 125 mg by mouth 3 (three) times daily.    [provider]  donepezil (ARICEPT) 10 MG tablet TAKE 1 TABLET BY MOUTH EVERY DAY EVERY NIGHT 11/18/19   Bedsole, Amy E, MD  famotidine (PEPCID) 40 MG tablet Take 40 mg by mouth daily.    [provider]  guaiFENesin (MUCINEX) 600 MG 12 hr tablet Take by mouth 2 (two) times daily.    [provider]  hyoscyamine (LEVBID) 0.375 MG 12 hr tablet Take 0.375 mg by mouth every 12 (twelve) hours as needed for cramping.    [provider]   lisinopril (ZESTRIL) 5 MG tablet TAKE 1 TABLET BY MOUTH EVERY DAY 01/02/20   Bedsole, Amy E, MD  meloxicam (MOBIC) 15 MG tablet TAKE 1 TABLET BY MOUTH EVERY DAY 12/02/19   Bedsole, Amy E, MD  memantine (NAMENDA) 10 MG tablet Take 0.5 tablets (5 mg total) by mouth 2 (two) times daily. 10/15/19   Bedsole, Amy E, MD  Multiple Vitamins-Minerals (CENTRUM SILVER PO) Take 1 tablet by mouth daily.    [provider]  nitroGLYCERIN (NITROSTAT) 0.3 MG SL tablet Place 1 tablet (0.3 mg total) under the tongue every 5 (five) minutes as needed for chest pain (call your doctor if pain persists after 3 doses). 05/28/20 05/28/21  Shaune Pollack, MD  pantoprazole (PROTONIX) 40 MG tablet TAKE 1 TABLET BY MOUTH EVERY DAY 01/21/20   Bedsole, Amy E, MD  promethazine (PHENERGAN) 12.5 MG tablet Take 12.5 mg by mouth every 6 (six) hours as needed for nausea or vomiting. Patient not taking: Reported on 09/21/2020    [provider]  propranolol (INDERAL) 40 MG tablet TAKE 1 TABLET BY MOUTH TWICE A DAY 06/15/20   Bedsole, Amy E, MD  risperiDONE (RISPERDAL) 0.5 MG tablet Take by mouth. 09/30/19   [provider]  TRINTELLIX 20 MG TABS tablet Take 20 mg by mouth daily. 09/21/19   [provider]  vitamin B-12 (CYANOCOBALAMIN) 1000 MCG tablet Take 1,000 mcg by mouth daily.    [provider]  Vitamin D3 (VITAMIN D) 25 MCG tablet Take 1,000 Units by mouth daily.    [provider]     Allergies Patient has no known allergies.  Family History  Problem Relation Age of Onset   Alzheimer's disease Mother    Diabetes Maternal Grandfather    Coronary artery disease Maternal Grandfather     Social History Social History   Tobacco Use   Smoking status: Never   Smokeless tobacco: Never  Vaping Use   Vaping Use: Never used  Substance Use Topics   Alcohol use: No    Alcohol/week: 0.0 standard drinks   Drug use: No    Review of Systems limited by  dementia   Musculoskeletal: Right hip pain respiratory: Positive cough   ____________________________________________   PHYSICAL EXAM:  VITAL SIGNS: ED Triage Vitals  Enc Vitals Group     BP 12/11/20 0815 (!) 150/67     Pulse Rate 12/11/20 0815 71     Resp 12/11/20 0815 15     Temp 12/11/20 0817 97.9 F (36.6 C)     Temp Source 12/11/20 0817 Oral     SpO2 12/11/20 0815 95 %     Weight 12/11/20 0817 60.8 kg (134 lb)     Height 12/11/20 0817 1.6 m (5\' 3" )     Head Circumference --      Peak Flow --  Pain Score --      Pain Loc --      Pain Edu? --      Excl. in GC? --     Constitutional: Alert  Eyes: Conjunctivae are normal.  Head: Atraumatic. Nose: No congestion/rhinnorhea. Mouth/Throat: Mucous membranes are moist.   Neck:  Painless ROM, no pain with axial load Cardiovascular: Normal rate, regular rhythm. Grossly normal heart sounds.  Good peripheral circulation. Respiratory: Normal respiratory effort.  No retractions.  Bibasilar Rales, occasional cough Gastrointestinal: Soft and nontender. No distention.  No CVA tenderness. Genitourinary: deferred Musculoskeletal: Right leg shortening noted, patient unable to flex at the hip, pain at the hip with mild axial load, warm and well-perfused distally, no evidence of knee or ankle injury.  Known humerus injury on the right Neurologic:  Normal speech and language. No gross focal neurologic deficits are appreciated.  Skin:  Skin is warm, dry and intact. Psychiatric: Mood and affect are normal. Speech and behavior are normal.  ____________________________________________   LABS (all labs ordered are listed, but only abnormal results are displayed)  Labs Reviewed  BASIC METABOLIC PANEL - Abnormal; Notable for the following components:      Result Value   Glucose, Bld 113 (*)    Calcium 8.8 (*)    All other components within normal limits  CBC WITH DIFFERENTIAL/PLATELET - Abnormal; Notable for the following  components:   WBC 13.7 (*)    RBC 3.34 (*)    Hemoglobin 10.4 (*)    HCT 31.9 (*)    Platelets 405 (*)    Neutro Abs 12.3 (*)    All other components within normal limits  RESP PANEL BY RT-PCR (FLU A&B, COVID) ARPGX2  CULTURE, BLOOD (SINGLE)  CK  LACTIC ACID, PLASMA  LACTIC ACID, PLASMA   ____________________________________________  EKG None ____________________________________________  RADIOLOGY  Chest x-ray viewed by me concerning for possible pneumonia Hip x-ray demonstrates right hip fracture ____________________________________________   PROCEDURES  Procedure(s) performed: No  Procedures   Critical Care performed: No ____________________________________________   INITIAL IMPRESSION / ASSESSMENT AND PLAN / ED COURSE  Pertinent labs & imaging results that were available during my care of the patient were reviewed by me and considered in my medical decision making (see chart for details).   Patient presents after a fall as noted above.  Exam is concerning for hip fracture  Lab work notable for mildly elevated white blood cell count.  Patient is afebrile but does have a frequent cough  Hip x-ray demonstrates right subcapital fracture.  Discussed with Dr. Joice Lofts of orthopedics who will see the patient, she is n.p.o.  X-ray concerning for pneumonia, will treat with IV Rocephin and IV azithromycin, blood culture lactic ordered  I discussed with the hospitalist for admission    ____________________________________________   FINAL CLINICAL IMPRESSION(S) / ED DIAGNOSES  Final diagnoses:  Community acquired pneumonia of right lung, unspecified part of lung  Fall, initial encounter  Closed fracture of right hip, initial encounter [facility])        Note:  This document was prepared using Dragon voice recognition software and may include unintentional dictation errors.    Jene Every, MD 12/11/20 1011

## 2020-12-11 NOTE — Progress Notes (Signed)
Initial Nutrition Assessment  DOCUMENTATION CODES:  Not applicable  INTERVENTION:  Recommend regular diet after surgery to promote adequate oral intake Ensure Enlive po BID, each supplement provides 350 kcal and 20 grams of protein MVI with minerals daily  NUTRITION DIAGNOSIS:  Increased nutrient needs related to hip fracture as evidenced by estimated needs.  GOAL:  Patient will meet greater than or equal to 90% of their needs  MONITOR:  PO intake, Labs, Weight trends  REASON FOR ASSESSMENT:  Consult Hip fracture protocol  ASSESSMENT:  75 y.o. female presented to ED with reports of hip pain after a fall at home. Imaging in ED showed Hip x-ray demonstrates right subcapital fracture. S/P recent fall which resulted in humerus fracture. PMH relevant for dementia, osteoarthritis, DM, HLD, HTN  Pt out of room for surgery at this time. Will defer interview and physical exam at this time. Unsure if weight loss has occurred as current weight is stated. Will add nutrition supplement to encourage recovery after surgery.    Medications and labs reviewed  NUTRITION - FOCUSED PHYSICAL EXAM: Defer to in-person assessment  Diet Order:   Diet Order             Diet NPO time specified Except for: Sips with Meds, Ice Chips  Diet effective now                   EDUCATION NEEDS:  No education needs have been identified at this time  Skin:  Skin Assessment:  (No RN assessment to review)  Last BM:  unknown  Height:  Ht Readings from Last 1 Encounters:  12/11/20 5\' 3"  (1.6 m)    Weight:  Wt Readings from Last 1 Encounters:  12/11/20 60.8 kg    Ideal Body Weight:  52.3 kg  BMI:  Body mass index is 23.74 kg/m.  Estimated Nutritional Needs:  Kcal:  1500-1700 kcal/d Protein:  75-85 g/d Fluid:  1.5-1.8 L/d   12/13/20, RD, LDN Clinical Dietitian Pager on Amion

## 2020-12-11 NOTE — ED Notes (Signed)
Patient transported to CT 

## 2020-12-11 NOTE — Anesthesia Preprocedure Evaluation (Signed)
Anesthesia Evaluation  Patient identified by MRN, date of birth, ID band Patient awake    Reviewed: Allergy & Precautions, NPO status , Patient's Chart, lab work & pertinent test results  History of Anesthesia Complications Negative for: history of anesthetic complications  Airway Mallampati: III  TM Distance: >3 FB Neck ROM: Full    Dental no notable dental hx.    Pulmonary neg pulmonary ROS, neg sleep apnea, neg COPD,    breath sounds clear to auscultation- rhonchi (-) wheezing      Cardiovascular hypertension, Pt. on medications (-) CAD, (-) Past MI, (-) Cardiac Stents and (-) CABG  Rhythm:Regular Rate:Normal - Systolic murmurs and - Diastolic murmurs    Neuro/Psych  Headaches, PSYCHIATRIC DISORDERS Anxiety Depression Dementia TIACVA, No Residual Symptoms    GI/Hepatic Neg liver ROS, PUD,   Endo/Other  diabetes  Renal/GU negative Renal ROS     Musculoskeletal  (+) Arthritis ,   Abdominal (+) - obese,   Peds  Hematology negative hematology ROS (+)   Anesthesia Other Findings Past Medical History: No date: Anxiety No date: Arthritis No date: Depression No date: Diabetes mellitus     Comment:  diet controlled No date: Headache     Comment:  migraine headache No date: History of kidney stones No date: Hyperlipidemia No date: Hypertension No date: Memory difficulties No date: Sleep disorder No date: Stroke [facility]) No date: TIA (transient ischemic attack)     Comment:  found on MRI.   Reproductive/Obstetrics                             Lab Results  Component Value Date   WBC 13.7 (H) 12/11/2020   HGB 10.4 (L) 12/11/2020   HCT 31.9 (L) 12/11/2020   MCV 95.5 12/11/2020   PLT 405 (H) 12/11/2020    Anesthesia Physical Anesthesia Plan  ASA: 3  Anesthesia Plan: Spinal   Post-op Pain Management:    Induction:   PONV Risk Score and Plan: 2 and Ondansetron and Propofol  infusion  Airway Management Planned: Natural Airway  Additional Equipment:   Intra-op Plan:   Post-operative Plan:   Informed Consent: I have reviewed the patients History and Physical, chart, labs and discussed the procedure including the risks, benefits and alternatives for the proposed anesthesia with the patient or authorized representative who has indicated his/her understanding and acceptance.     Dental advisory given  Plan Discussed with: CRNA and Anesthesiologist  Anesthesia Plan Comments:         Anesthesia Quick Evaluation

## 2020-12-11 NOTE — Op Note (Signed)
12/11/2020  5:04 PM  Patient:   Crystal Newton  Pre-Op Diagnosis:   Displaced femoral neck fracture, right hip.  Post-Op Diagnosis:   Same.  Procedure:   Right hip unipolar hemiarthroplasty.  Surgeon:   Maryagnes Amos, MD  Assistant:   Horris Latino, PA-C  Anesthesia:   Spinal  Findings:   As above.  Complications:   None  EBL:   150 cc  Fluids:   1000 cc crystalloid  UOP:   150 cc  TT:   None  Drains:   None  Closure:   Staples  Implants:   Biomet press-fit system with a #12 laterally offset reduced proximal profile Echo femoral stem, a 46 mm outer diameter shell, and a -3 mm neck adapter.  Brief Clinical Note:   The patient is a 75 year old female who sustained above-noted injury early this morning when she apparently lost her balance and fell while going to the bathroom, injuring her right hip. She was brought to the emergency room where x-rays demonstrated a displaced right femoral neck fracture. The patient has been cleared medically and presents at this time for definitive management of the injury.  Procedure:   The patient was brought into the operating room. After adequate spinal anesthesia was obtained, the patient underwent placement of a Foley catheter before she was repositioned in the left lateral decubitus position and secured using a lateral hip positioner. The right hip and lower extremity were prepped with ChloroPrep solution before being draped sterilely. Preoperative antibiotics were administered. A timeout was performed to verify the appropriate surgical site.    A standard posterior approach to the hip was made through an approximately 4-5 inch incision. The incision was carried down through the subcutaneous tissues to expose the gluteal fascia and proximal end of the iliotibial band. These structures were split the length of the incision and the Charnley self-retaining hip retractor placed. The bursal tissues were swept posteriorly to expose the short  external rotators. The anterior border of the piriformis tendon was identified and this plane developed down through the capsule to enter the joint. Abundant fracture hematoma was suctioned. A flap of tissue was elevated off the posterior aspect of the femoral neck and greater trochanter and retracted posteriorly. This flap included the piriformis tendon, the short external rotators, and the posterior capsule. The femoral head was removed in its entirety, then taken to the back table where it was measured and found to be optimally replicated by a 46 mm head. The appropriate trial head was inserted and found to demonstrate an excellent suction fit.   Attention was directed to the femoral side. The femoral neck was recut 10-12 mm above the lesser trochanter using an oscillating saw. The piriformis fossa was debrided of soft tissues before the intramedullary canal was accessed through this point using a triple step reamer. The canal was reamed sequentially beginning with a #7 tapered reamer and progressing to a #12 tapered reamer. This provided excellent circumferential chatter. A box osteotome was used to establish version before the canal was broached sequentially beginning with a #9 broach and progressing to a #12 broach. This was left in place and several trial reductions performed. The permanent #12 reduced proximal profile femoral stem was impacted into place. A repeat trial reduction was performed using both the -6 mm and -3 mm neck lengths. The -3 mm neck length demonstrated excellent stability both in extension and external rotation as well as with flexion to 90 and internal rotation beyond  70. It also was stable in the position of sleep. The 46 mm outer diameter shell with the -3 mm neck adapter construct was put together on the back table before being impacted onto the stem of the femoral component. The Morse taper locking mechanism was verified using manual distraction before the head was relocated and  the hip placed through a range of motion with the findings as described above.  The wound was copiously irrigated with sterile saline solution via the jet lavage system before the peri-incisional and pericapsular tissues were injected with 30 cc of 0.5% Sensorcaine with epinephrine and 20 cc of Exparel diluted out to 60 cc with normal saline to help with postoperative analgesia. The posterior flap was reapproximated to the posterior aspect of the greater trochanter using #2 Tycron interrupted sutures placed through drill holes. The iliotibial band was reapproximated using #1 Vicryl interrupted sutures before the gluteal fascia was closed using a running #1 Vicryl suture. At this point, 1 g of transexemic acid in 10 cc of normal saline was injected into the joint to help reduce postoperative bleeding. The subcutaneous tissues were closed in several layers using 2-0 Vicryl interrupted sutures before the skin was closed using staples. A sterile occlusive dressing was applied to the wound . The patient then was rolled back into the supine position on the hospital bed before being awakened and returned to the recovery room in satisfactory condition after tolerating the procedure well.

## 2020-12-11 NOTE — ED Notes (Signed)
Katie RN aware of assigned bed °

## 2020-12-11 NOTE — H&P (Signed)
History and Physical    Crystal Newton PRF:[id number] DOB: Nov 10, 1945 DOA: 12/11/2020  Referring MD/NP/PA:   PCP: Alm Bustard, NP   Patient coming from:  The patient is coming from home.  At baseline, pt is independent for most of ADL.        Chief Complaint: fall and right hip pain.  HPI: Crystal Newton is a 75 y.o. female with medical history significant of hypertension, hyperlipidemia, diet-controlled diabetes, stroke, TIA, GERD, depression, anxiety, migraine headache, dementia, kidney stone, PAD, recent right humerus fracture on sling, who presents with fall and right hip pain.  Pt states that she accidentally fell when she was using bathroom in the early AM.  No loss of consciousness.  She states that she hit her head, but no headache or neck pain.  She developed pain in right hip, which is constant, sharp, severe, nonradiating.  Patient has cough which has been going on for almost a month, with little greenish colored sputum production.  No chest pain, fever or chills.  No nausea, vomiting, diarrhea or abdominal pain.  No symptoms of UTI.  ED Course: pt was found to have WBC 13.7, negative COVID PCR, CK level 76, electrolytes renal function okay, temperature normal, blood pressure 150/67, heart rate 71, RR 15, oxygen saturation 95% on room air.  CT of head is negative for acute intracranial abnormalities.  Chest x-ray showed a patchy right upper lobe infiltration.  X-ray of right hip/pelvis showed displaced right subcapital femoral fracture.  Patient is admitted to MedSurg bed as inpatient.  Dr.Poggi of Ortho is consulted.  Review of Systems:   General: no fevers, chills, no body weight gain, has fatigue HEENT: no blurry vision, hearing changes or sore throat Respiratory: no dyspnea, has coughing, no wheezing CV: no chest pain, no palpitations GI: no nausea, vomiting, abdominal pain, diarrhea, constipation GU: no dysuria, burning on urination, increased urinary frequency, hematuria   Ext: no leg edema Neuro: no unilateral weakness, numbness, or tingling, no vision change or hearing loss. Has fall. Skin: no rash, no skin tear. MSK: has right hip pain. Heme: No easy bruising.  Travel history: No recent long distant travel.  Allergy: No Known Allergies  Past Medical History:  Diagnosis Date   Anxiety    Arthritis    Depression    Diabetes mellitus    diet controlled   Headache    migraine headache   History of kidney stones    Hyperlipidemia    Hypertension    Memory difficulties    Sleep disorder    Stroke (HCC)    TIA (transient ischemic attack)    found on MRI.    Past Surgical History:  Procedure Laterality Date   ABDOMINAL HYSTERECTOMY     BREAST BIOPSY     CATARACT EXTRACTION W/PHACO Right 10/17/2019   Procedure: CATARACT EXTRACTION PHACO AND INTRAOCULAR LENS PLACEMENT (IOC) RIGHT DIABETIC VISION BLUE 9.96  00:59.0;  Surgeon: Elliot Cousin, MD;  Location: [facility];  Service: Ophthalmology;  Laterality: Right;  Diabetic - diet controlled   DILATION AND CURETTAGE OF UTERUS     ESOPHAGOGASTRODUODENOSCOPY (EGD) WITH PROPOFOL N/A 09/21/2020   Procedure: ESOPHAGOGASTRODUODENOSCOPY (EGD) WITH PROPOFOL;  Surgeon: Regis Bill, MD;  Location: [facility];  Service: Endoscopy;  Laterality: N/A;  REQUESTS AM   PARTIAL COLECTOMY  1996   ruptured abcess for diverticulitis   PARTIAL HYSTERECTOMY     uterine prolapse   right parotid     TONSILLECTOMY  Social History:  reports that she has never smoked. She has never used smokeless tobacco. She reports that she does not drink alcohol and does not use drugs.  Family History:  Family History  Problem Relation Age of Onset   Alzheimer's disease Mother    Diabetes Maternal Grandfather    Coronary artery disease Maternal Grandfather      Prior to Admission medications   Medication Sig Start Date End Date Taking? Authorizing Provider  ASPIRIN 81 PO Take by mouth daily.    [provider]  atorvastatin (LIPITOR) 80 MG tablet TAKE 1 TABLET BY MOUTH EVERY DAY 12/02/19   Bedsole, Amy E, MD  benzonatate (TESSALON) 200 MG capsule Take 200 mg by mouth 3 (three) times daily as needed for cough.    [provider]  busPIRone (BUSPAR) 5 MG tablet Take 5 mg by mouth 2 (two) times daily.    [provider]  cetirizine (ZYRTEC) 10 MG tablet Take 10 mg by mouth daily.    [provider]  divalproex (DEPAKOTE) 125 MG DR tablet Take 125 mg by mouth 3 (three) times daily.    [provider]  donepezil (ARICEPT) 10 MG tablet TAKE 1 TABLET BY MOUTH EVERY DAY EVERY NIGHT 11/18/19   Bedsole, Amy E, MD  famotidine (PEPCID) 40 MG tablet Take 40 mg by mouth daily.    [provider]  guaiFENesin (MUCINEX) 600 MG 12 hr tablet Take by mouth 2 (two) times daily.    [provider]  hyoscyamine (LEVBID) 0.375 MG 12 hr tablet Take 0.375 mg by mouth every 12 (twelve) hours as needed for cramping.    [provider]  lisinopril (ZESTRIL) 5 MG tablet TAKE 1 TABLET BY MOUTH EVERY DAY 01/02/20   Bedsole, Amy E, MD  meloxicam (MOBIC) 15 MG tablet TAKE 1 TABLET BY MOUTH EVERY DAY 12/02/19   Bedsole, Amy E, MD  memantine (NAMENDA) 10 MG tablet Take 0.5 tablets (5 mg total) by mouth 2 (two) times daily. 10/15/19   Bedsole, Amy E, MD  Multiple Vitamins-Minerals (CENTRUM SILVER PO) Take 1 tablet by mouth daily.    [provider]  nitroGLYCERIN (NITROSTAT) 0.3 MG SL tablet Place 1 tablet (0.3 mg total) under the tongue every 5 (five) minutes as needed for chest pain (call your doctor if pain persists after 3 doses). 05/28/20 05/28/21  Shaune Pollack, MD  pantoprazole (PROTONIX) 40 MG tablet TAKE 1 TABLET BY MOUTH EVERY DAY 01/21/20   Bedsole, Amy E, MD  promethazine (PHENERGAN) 12.5 MG tablet Take 12.5 mg by mouth every 6 (six) hours as needed for nausea or vomiting. Patient not taking: Reported on 09/21/2020    [provider]   propranolol (INDERAL) 40 MG tablet TAKE 1 TABLET BY MOUTH TWICE A DAY 06/15/20   Bedsole, Amy E, MD  risperiDONE (RISPERDAL) 0.5 MG tablet Take by mouth. 09/30/19   [provider]  TRINTELLIX 20 MG TABS tablet Take 20 mg by mouth daily. 09/21/19   [provider]  vitamin B-12 (CYANOCOBALAMIN) 1000 MCG tablet Take 1,000 mcg by mouth daily.    [provider]  Vitamin D3 (VITAMIN D) 25 MCG tablet Take 1,000 Units by mouth daily.    [provider]    Physical Exam: Vitals:   12/11/20 1745 12/11/20 1800 12/11/20 1806 12/11/20 1815  BP: (!) 166/63 (!) 170/62  (!) 164/63  Pulse: 84 84 81 82  Resp: 15 15 16 13   Temp:  TempSrc:      SpO2: 98% 95% 94% 98%  Weight:      Height:       General: Not in acute distress HEENT:       Eyes: PERRL, EOMI, no scleral icterus.       ENT: No discharge from the ears and nose, no pharynx injection, no tonsillar enlargement.        Neck: No JVD, no bruit, no mass felt. Heme: No neck lymph node enlargement. Cardiac: S1/S2, RRR, No murmurs, No gallops or rubs. Respiratory: No rales, wheezing, rhonchi or rubs. GI: Soft, nondistended, nontender, no rebound pain, no organomegaly, BS present. GU: No hematuria Ext: No leg edema bilaterally. 1+DP/PT pulse bilaterally. Has sling in right arm Musculoskeletal: has tenderness in right hip Skin: No rashes.  Neuro: Alert, oriented X3, cranial nerves II-XII grossly intact, moves all extremities normally.  Psych: Patient is not psychotic, no suicidal or hemocidal ideation.  Labs on Admission: I have personally reviewed following labs and imaging studies  CBC: Recent Labs  Lab 12/11/20 0821  WBC 13.7*  NEUTROABS 12.3*  HGB 10.4*  HCT 31.9*  MCV 95.5  PLT 405*   Basic Metabolic Panel: Recent Labs  Lab 12/11/20 0821  NA 140  K 3.8  CL 101  CO2 31  GLUCOSE 113*  BUN 15  CREATININE 0.59  CALCIUM 8.8*   GFR: Estimated Creatinine Clearance: 50.3 mL/min (by C-G  formula based on SCr of 0.59 mg/dL). Liver Function Tests: No results for input(s): AST, ALT, ALKPHOS, BILITOT, PROT, ALBUMIN in the last 168 hours. No results for input(s): LIPASE, AMYLASE in the last 168 hours. No results for input(s): AMMONIA in the last 168 hours. Coagulation Profile: Recent Labs  Lab 12/11/20 0821  INR 1.0   Cardiac Enzymes: Recent Labs  Lab 12/11/20 0821  CKTOTAL 76   BNP (last 3 results) No results for input(s): PROBNP in the last 8760 hours. HbA1C: No results for input(s): HGBA1C in the last 72 hours. CBG: Recent Labs  Lab 12/11/20 1717  GLUCAP 125*   Lipid Profile: No results for input(s): CHOL, HDL, LDLCALC, TRIG, CHOLHDL, LDLDIRECT in the last 72 hours. Thyroid Function Tests: No results for input(s): TSH, T4TOTAL, FREET4, T3FREE, THYROIDAB in the last 72 hours. Anemia Panel: No results for input(s): VITAMINB12, FOLATE, FERRITIN, TIBC, IRON, RETICCTPCT in the last 72 hours. Urine analysis:    Component Value Date/Time   COLORURINE ORANGE (A) 07/31/2013 2053   APPEARANCEUR CLOUDY (A) 07/31/2013 2053   LABSPEC 1.026 07/31/2013 2053   PHURINE 5.0 07/31/2013 2053   GLUCOSEU NEGATIVE 07/31/2013 2053   HGBUR TRACE (A) 07/31/2013 2053   BILIRUBINUR Negative 03/20/2014 0930   KETONESUR >80 (A) 07/31/2013 2053   PROTEINUR Negative 03/20/2014 0930   PROTEINUR NEGATIVE 07/31/2013 2053   UROBILINOGEN 0.2 03/20/2014 0930   UROBILINOGEN 1.0 07/31/2013 2053   NITRITE Negative 03/20/2014 0930   NITRITE POSITIVE (A) 07/31/2013 2053   LEUKOCYTESUR Negative 03/20/2014 0930   Sepsis Labs: @LABRCNTIP (procalcitonin:4,lacticidven:4) ) Recent Results (from the past 240 hour(s))  Resp Panel by RT-PCR (Flu A&B, Covid) Nasopharyngeal Swab     Status: None   Collection Time: 12/11/20  9:52 AM   Specimen: Nasopharyngeal Swab; Nasopharyngeal(NP) swabs in vial transport medium  Result Value Ref Range Status   SARS Coronavirus 2 by RT PCR NEGATIVE NEGATIVE  Final    Comment: (NOTE) SARS-CoV-2 target nucleic acids are NOT DETECTED.  The SARS-CoV-2 RNA is generally detectable in upper respiratory specimens during the  acute phase of infection. The lowest concentration of SARS-CoV-2 viral copies this assay can detect is 138 copies/mL. A negative result does not preclude SARS-Cov-2 infection and should not be used as the sole basis for treatment or other patient management decisions. A negative result may occur with  improper specimen collection/handling, submission of specimen other than nasopharyngeal swab, presence of viral mutation(s) within the areas targeted by this assay, and inadequate number of viral copies(<138 copies/mL). A negative result must be combined with clinical observations, patient history, and epidemiological information. The expected result is Negative.  Fact Sheet for Patients:  BloggerCourse.com  Fact Sheet for Healthcare Providers:  SeriousBroker.it  This test is no t yet approved or cleared by the [place] FDA and  has been authorized for detection and/or diagnosis of SARS-CoV-2 by FDA under an Emergency Use Authorization (EUA). This EUA will remain  in effect (meaning this test can be used) for the duration of the COVID-19 declaration under Section 564(b)(1) of the Act, 21 U.S.C.section 360bbb-3(b)(1), unless the authorization is terminated  or revoked sooner.       Influenza A by PCR NEGATIVE NEGATIVE Final   Influenza B by PCR NEGATIVE NEGATIVE Final    Comment: (NOTE) The Xpert Xpress SARS-CoV-2/FLU/RSV plus assay is intended as an aid in the diagnosis of influenza from Nasopharyngeal swab specimens and should not be used as a sole basis for treatment. Nasal washings and aspirates are unacceptable for Xpert Xpress SARS-CoV-2/FLU/RSV testing.  Fact Sheet for Patients: BloggerCourse.com  Fact Sheet for Healthcare  Providers: SeriousBroker.it  This test is not yet approved or cleared by the [place] FDA and has been authorized for detection and/or diagnosis of SARS-CoV-2 by FDA under an Emergency Use Authorization (EUA). This EUA will remain in effect (meaning this test can be used) for the duration of the COVID-19 declaration under Section 564(b)(1) of the Act, 21 U.S.C. section 360bbb-3(b)(1), unless the authorization is terminated or revoked.  Performed at [facility], [address] [place], [place] [address]      Radiological Exams on Admission: CT HEAD WO CONTRAST ( )  Result Date: 12/11/2020 CLINICAL DATA:  Head trauma, minor (Age >= 65y) EXAM: CT HEAD WITHOUT CONTRAST TECHNIQUE: Contiguous axial images were obtained from the base of the skull through the vertex without intravenous contrast. COMPARISON:  11/24/2020 FINDINGS: Brain: No evidence of acute infarction, hemorrhage, hydrocephalus, extra-axial collection or mass lesion/mass effect. Mild low-density changes within the periventricular and subcortical white matter compatible with chronic microvascular ischemic change. Mild diffuse cerebral volume loss. Vascular: Atherosclerotic calcifications involving the large vessels of the skull base. No unexpected hyperdense vessel. Skull: Normal. Negative for fracture or focal lesion. Sinuses/Orbits: No acute finding. Other: None. IMPRESSION: 1. No acute intracranial findings. 2. Chronic microvascular ischemic change and cerebral volume loss. Electronically Signed   By: Duanne Guess D.O.   On: 12/11/2020 12:34   DG Chest Port 1 View  Result Date: 12/11/2020 CLINICAL DATA:  75 year old female with history of right-sided hip pain after a fall in the bathroom today. EXAM: PORTABLE CHEST 1 VIEW COMPARISON:  Chest x-ray 08/18/2020. FINDINGS: Several patchy opacities are noted in the right upper lobe, including a band like opacity immediately above the minor  fissure which appears superiorly displaced. Left lung is clear. No pleural effusions. No pneumothorax. No evidence of pulmonary edema. Heart size is normal. Upper mediastinal contours are within normal limits allowing for patient rotation the left. Atherosclerotic calcifications in the thoracic aorta. IMPRESSION: 1. Patchy opacities  in the right upper lobe which likely reflect a combination of airspace consolidation and atelectasis concerning for developing bronchopneumonia. Followup PA and lateral chest X-ray is recommended in 3-4 weeks following trial of antibiotic therapy to ensure resolution and exclude underlying malignancy. 2. Aortic atherosclerosis. Electronically Signed   By: Trudie Reed M.D.   On: 12/11/2020 09:27   DG Hip Unilat  With Pelvis 2-3 Views Right  Result Date: 12/11/2020 CLINICAL DATA:  Right hip pain.  Fall. EXAM: DG HIP (WITH OR WITHOUT PELVIS) 2-3V RIGHT COMPARISON:  None. FINDINGS: Acute displaced fracture involving the proximal right femur. The fracture appears to be at the junction of the femoral head and neck and suggestive for a subcapital fracture. The right femoral neck is superiorly displaced. Right femoral head appears to be located. Pelvic bony ring is intact. Normal appearance of the left hip. IMPRESSION: Displaced right subcapital femur fracture. Electronically Signed   By: Richarda Overlie M.D.   On: 12/11/2020 09:26     EKG: Not done in ED, will get one.   Assessment/Plan Principal Problem:   Closed right hip fracture (HCC) Active Problems:   Hyperlipidemia LDL goal <100   MDD (major depressive disorder), recurrent episode, severe (HCC)   Essential hypertension, benign   Dementia, vascular, mixed, with behavioral disturbance (HCC)   CAP (community acquired pneumonia)   Stroke (HCC)   Humerus fracture-right    Closed right hip fracture (HCC): As evidenced by x-ray. Patient has moderate pain now. No neurovascular compromise. Orthopedic surgeon, Dr.  Joice Lofts was  consulted.   - will admit to Med-surg bed - Pain control: morphine prn and percocet - When necessary Zofran for nausea - Robaxin for muscle spasm - type and cross - INR/PTT - PT/OT when able to (not ordered now)  Hyperlipidemia LDL goal <100 -Lipitor  MDD (major depressive disorder), recurrent episode, severe (HCC) -Continue home medications  Essential hypertension, benign -Hydralazine IV as needed -Lisinopril, propranolol  Dementia, vascular, mixed, with behavioral disturbance (HCC) -Namenda, donepezil, Depakote  CAP (community acquired pneumonia): Patient has been having coughing for almost a month.  Chest x-ray showed patchy right upper lobe infiltration.  Patient has leukocytosis with WBC 13.7.  No fever.  No sepsis. - IV Rocephin and azithromycin - Mucinex for cough  - Bronchodilators - Urine legionella and S. pneumococcal antigen - Follow up blood culture x2, sputum culture   Stroke (HCC) -Aspirin, Lipitor  Humerus fracture-right -on sling      DVT ppx: SCD Code Status: DNR per her daughter Family Communication:  Yes, patient's daughter   at bed side Disposition Plan:  Anticipate discharge back to previous environment Consults called:  Dr. Joice Lofts Admission status and Level of care: Med-Surg:    Med-surg bed  as inpt   Status is: Inpatient  Remains inpatient appropriate because:Inpatient level of care appropriate due to severity of illness  Dispo: The patient is from: Home              Anticipated d/c is to:  to be determined              Patient currently is not medically stable to d/c.   Difficult to place patient No          Date of Service 12/11/2020    Lorretta Harp [facility]   If 7PM-7AM, please contact night-coverage [email or web address] 12/11/2020, 6:28 PM

## 2020-12-12 DIAGNOSIS — S72001A Fracture of unspecified part of neck of right femur, initial encounter for closed fracture: Secondary | ICD-10-CM | POA: Insufficient documentation

## 2020-12-12 LAB — CBC
HCT: 24.9 % — ABNORMAL LOW (ref 36.0–46.0)
Hemoglobin: 8.3 g/dL — ABNORMAL LOW (ref 12.0–15.0)
MCH: 31.9 pg (ref 26.0–34.0)
MCHC: 33.3 g/dL (ref 30.0–36.0)
MCV: 95.8 fL (ref 80.0–100.0)
Platelets: 310 10*3/uL (ref 150–400)
RBC: 2.6 MIL/uL — ABNORMAL LOW (ref 3.87–5.11)
RDW: 13.9 % (ref 11.5–15.5)
WBC: 11.4 10*3/uL — ABNORMAL HIGH (ref 4.0–10.5)
nRBC: 0 % (ref 0.0–0.2)

## 2020-12-12 LAB — BASIC METABOLIC PANEL
Anion gap: 6 (ref 5–15)
BUN: 10 mg/dL (ref 8–23)
CO2: 27 mmol/L (ref 22–32)
Calcium: 8.1 mg/dL — ABNORMAL LOW (ref 8.9–10.3)
Chloride: 104 mmol/L (ref 98–111)
Creatinine, Ser: 0.35 mg/dL — ABNORMAL LOW (ref 0.44–1.00)
GFR, Estimated: >60 mL/min (ref >60)
Glucose, Bld: 135 mg/dL — ABNORMAL HIGH (ref 70–99)
Potassium: 3.3 mmol/L — ABNORMAL LOW (ref 3.5–5.1)
Sodium: 137 mmol/L (ref 135–145)

## 2020-12-12 LAB — HIV ANTIBODY (ROUTINE TESTING W REFLEX): HIV Screen 4th Generation wRfx: NONREACTIVE

## 2020-12-12 MED ORDER — OXYCODONE-ACETAMINOPHEN 5-325 MG PO TABS
1.0000 | ORAL_TABLET | ORAL | Status: DC | PRN
Start: 2020-12-12 — End: 2020-12-15
  Administered 2020-12-12 – 2020-12-14 (×3): 1 via ORAL
  Filled 2020-12-12 (×3): qty 1

## 2020-12-12 MED ORDER — POTASSIUM CHLORIDE CRYS ER 20 MEQ PO TBCR
40.0000 meq | EXTENDED_RELEASE_TABLET | Freq: Once | ORAL | Status: AC
  Administered 2020-12-12: 40 meq via ORAL

## 2020-12-12 NOTE — Progress Notes (Signed)
  Subjective: 1 Day Post-Op Procedure(s) (LRB): ARTHROPLASTY BIPOLAR HIP (HEMIARTHROPLASTY) (Right) Patient reports pain as mild.   Patient is well, but has had some minor complaints of nausea overnight Discharge recommendations per physical therapy-no assessment yet. Negative for chest pain and shortness of breath Fever: no Gastrointestinal: positive for nausea.  Patient has not had a bowel movement.  Objective: Vital signs in last 24 hours: Temp:  [97.7 F (36.5 C)-99 F (37.2 C)] 97.9 F (36.6 C) (08/27 0445) Pulse Rate:  [75-96] 81 (08/27 0745) Resp:  [13-20] 18 (08/27 0745) BP: (121-176)/(57-77) 152/77 (08/27 0745) SpO2:  [90 %-100 %] 96 % (08/27 0745)  Intake/Output from previous day:  Intake/Output Summary (Last 24 hours) at 12/12/2020 0959 Last data filed at 12/12/2020 0600 Gross per 24 hour  Intake 1450 ml  Output 1320 ml  Net 130 ml    Intake/Output this shift: No intake/output data recorded.  Labs: Recent Labs    12/11/20 0821 12/12/20 0459  HGB 10.4* 8.3*   Recent Labs    12/11/20 0821 12/12/20 0459  WBC 13.7* 11.4*  RBC 3.34* 2.60*  HCT 31.9* 24.9*  PLT 405* 310   Recent Labs    12/11/20 0821 12/12/20 0459  NA 140 137  K 3.8 3.3*  CL 101 104  CO2 31 27  BUN 15 10  CREATININE 0.59 0.35*  GLUCOSE 113* 135*  CALCIUM 8.8* 8.1*   Recent Labs    12/11/20 0821  INR 1.0     EXAM General - Patient is Alert, Appropriate, and Oriented Extremity - Neurovascular intact Dorsiflexion/Plantar flexion intact Compartment soft Skin over the R shoulder is clean and dry Dressing/Incision -clean, dry, no drainage Motor Function - intact, moving foot and toes well on exam.   Able to flex/extend elbow, flex and extend fingers, make all signs, n/v intact to light touch over hand  Cardiovascular- Regular rate and rhythm, no murmurs/rubs/gallops Respiratory- Lungs clear to auscultation bilaterally Gastrointestinal- soft and active bowel sounds, minimally  tender in the RLQ    Assessment/Plan: 1 Day Post-Op Procedure(s) (LRB): ARTHROPLASTY BIPOLAR HIP (HEMIARTHROPLASTY) (Right) Principal Problem:   Closed right hip fracture (HCC) Active Problems:   Hyperlipidemia LDL goal <100   MDD (major depressive disorder), recurrent episode, severe (HCC)   Essential hypertension, benign   Dementia, vascular, mixed, with behavioral disturbance (HCC)   CAP (community acquired pneumonia)   Stroke (HCC)   Humerus fracture-right  Estimated body mass index is 23.74 kg/m as calculated from the following:   Height as of this encounter: 5\' 3"  (1.6 m).   Weight as of this encounter: 60.8 kg. Advance diet Up with therapy  Maintain posterior hip precautions, NonWB to RUE s/2 proximal humerus fracture      DVT Prophylaxis - Lovenox, Ted hose, and SCDs Weight-Bearing as tolerated to right leg  , PA-C [facility] Orth[facility] 12/12/2020, 9:59 AM

## 2020-12-12 NOTE — Evaluation (Signed)
Physical Therapy Evaluation Patient Details Name: Crystal Newton MRN: [id number] DOB: December 18, 1945 Today's Date: 12/12/2020   History of Present Illness  75 yo female with recent fall at home resulted in RUE humeral fracture, now with another fall at home and R displaced femoral neck fracture.  Pt received R hemiarthroplasty, NWB on RUE and WBAT RLE with posterior precautions.  PMHx:  HTN, HLD, DM, stroke, TIA, GERD, depression, anxiety, migraine, dementia, kidney stone, PAD, falls  Clinical Impression  Pt was seen for mobility initially and found that her fear of standing and taking a step prevented the transition to the chair.  However, she is able to assist to move at least to stand, and talked with nursing staff to relay her current abilities.  Follow up to get as mobile as possible, since her family may elect to take her home after all, and will need to be as able to assist as possible in that event.  PT is recommending SNF for safety and due to current abilities.    Follow Up Recommendations SNF    Equipment Recommendations  None recommended by PT    Recommendations for Other Services       Precautions / Restrictions Precautions Precautions: Fall;Posterior Hip Precaution Booklet Issued: No (verbally reviewed) Precaution Comments: pt is not able to remember instructions Required Braces or Orthoses: Other Brace;Sling Other Brace: using pillow to maintain precautions Restrictions Weight Bearing Restrictions: Yes RUE Weight Bearing: Non weight bearing RLE Weight Bearing: Weight bearing as tolerated Other Position/Activity Restrictions: sling on RUE      Mobility  Bed Mobility Overal bed mobility: Needs Assistance Bed Mobility: Supine to Sit;Sit to Supine     Supine to sit: Mod assist Sit to supine: Max assist   General bed mobility comments: reviewed hip precautions and protected RUE    Transfers Overall transfer level: Needs assistance Equipment used: 1 person hand held  assist Transfers: Sit to/from Stand Sit to Stand: Mod assist         General transfer comment: stands briefly but fearful to take a step  Ambulation/Gait             General Gait Details: unable  Stairs            Wheelchair Mobility    Modified Rankin (Stroke Patients Only)       Balance Overall balance assessment: Needs assistance Sitting-balance support: Feet supported Sitting balance-Leahy Scale: Fair     Standing balance support: Single extremity supported;During functional activity Standing balance-Leahy Scale: Poor Standing balance comment: requires support on trunk and LUE to stand                             Pertinent Vitals/Pain Pain Assessment: Faces Faces Pain Scale: Hurts little more Pain Location: R hip Pain Descriptors / Indicators: Operative site guarding Pain Intervention(s): Limited activity within patient's tolerance;Monitored during session;Premedicated before session;Repositioned;Ice applied    Home Living Family/patient expects to be discharged to:: Private residence Living Arrangements: Spouse/significant other Available Help at Discharge: Family Type of Home: House       Home Layout: One level        Prior Function Level of Independence: Independent with assistive device(s);Needs assistance   Gait / Transfers Assistance Needed: has assistance to walk at times  ADL's / Homemaking Assistance Needed: family to care for her        Hand Dominance   Dominant Hand: Right (has to use  LUE now)    Extremity/Trunk Assessment   Upper Extremity Assessment Upper Extremity Assessment: RUE deficits/detail RUE Deficits / Details: in a sling for R humeral fracture RUE: Unable to fully assess due to immobilization    Lower Extremity Assessment Lower Extremity Assessment: RLE deficits/detail RLE Deficits / Details: R hip hemiarthroplasty RLE: Unable to fully assess due to immobilization RLE Coordination: decreased  gross motor    Cervical / Trunk Assessment Cervical / Trunk Assessment: Kyphotic  Communication   Communication: No difficulties  Cognition Arousal/Alertness: Awake/alert Behavior During Therapy: Flat affect Overall Cognitive Status: History of cognitive impairments - at baseline                                        General Comments General comments (skin integrity, edema, etc.): Pt is demonstrating some standing support but cannot get a step yet.  Follow up with her to increase her control of gait with hemiwalker    Exercises     Assessment/Plan    PT Assessment Patient needs continued PT services  PT Problem List Decreased strength;Decreased range of motion;Decreased activity tolerance;Decreased balance;Decreased mobility;Decreased coordination;Decreased cognition;Decreased knowledge of use of DME;Decreased safety awareness;Decreased knowledge of precautions;Decreased skin integrity;Pain       PT Treatment Interventions DME instruction;Gait training;Functional mobility training;Therapeutic activities;Therapeutic exercise;Balance training;Neuromuscular re-education;Patient/family education;Stair training    PT Goals (Current goals can be found in the Care Plan section)  Acute Rehab PT Goals Patient Stated Goal: none stated PT Goal Formulation: With family Time For Goal Achievement: 12/26/20 Potential to Achieve Goals: Fair    Frequency 7X/week   Barriers to discharge Decreased caregiver support;Inaccessible home environment home with husband who may not be able to assist her alone    Co-evaluation               AM-PAC PT "6 Clicks" Mobility  Outcome Measure Help needed turning from your back to your side while in a flat bed without using bedrails?: A Lot Help needed moving from lying on your back to sitting on the side of a flat bed without using bedrails?: A Lot Help needed moving to and from a bed to a chair (including a wheelchair)?: A  Lot Help needed standing up from a chair using your arms (e.g., wheelchair or bedside chair)?: A Lot Help needed to walk in hospital room?: Total Help needed climbing 3-5 steps with a railing? : Total 6 Click Score: 10    End of Session Equipment Utilized During Treatment: Gait belt Activity Tolerance: Patient limited by pain;Treatment limited secondary to medical complications (Comment) Patient left: in bed;with call bell/phone within reach;with bed alarm set;with family/visitor present Nurse Communication: Mobility status PT Visit Diagnosis: Unsteadiness on feet (R26.81);Muscle weakness (generalized) (M62.81);Pain Pain - Right/Left: Right Pain - part of body: Arm;Hip    Time: 4854-6270 PT Time Calculation (min) (ACUTE ONLY): 26 min   Charges:   PT Evaluation $PT Eval Moderate Complexity: 1 Mod PT Treatments $Therapeutic Activity: 8-22 mins       Ivar Drape 12/12/2020, 1:13 PM  Samul Dada, PT MS Acute Rehab Dept. Number: [facility] [id number] and [facility] [phone]

## 2020-12-12 NOTE — Progress Notes (Signed)
Physical Therapy Treatment Patient Details Name: Crystal Newton MRN: [id number] DOB: 16-Nov-1945 Today's Date: 12/12/2020    History of Present Illness 75 yo female with recent fall at home resulted in RUE humeral fracture, now with another fall at home and R displaced femoral neck fracture.  Pt received R hemiarthroplasty, NWB on RUE and WBAT RLE with posterior precautions.  PMHx:  HTN, HLD, DM, stroke, TIA, GERD, depression, anxiety, migraine, dementia, kidney stone, PAD, falls    PT Comments    Pt was seen to attempt therapy and she is too fatigued to try to move, even for bed exercises.  Pt is restless so repositioned her with pillows to support shoulders and R forearm.  Pt is a bit more comfortable but also now awake and re-asking for pain meds.  Had been asleep when nursing previously came to medicate her, and PT let nursing know pt awake again.  Follow her for transfers and strengthening as tolerated.   Follow Up Recommendations  SNF     Equipment Recommendations  None recommended by PT    Recommendations for Other Services       Precautions / Restrictions Precautions Precautions: Fall;Posterior Hip Precaution Booklet Issued: No Precaution Comments: verbally reviewed Required Braces or Orthoses: Other Brace;Sling Other Brace: using pillow to maintain precautions Restrictions Weight Bearing Restrictions: Yes RUE Weight Bearing: Non weight bearing RLE Weight Bearing: Weight bearing as tolerated Other Position/Activity Restrictions: sling on RUE    Mobility  Bed Mobility Overal bed mobility: Needs Assistance Bed Mobility:  (scooting up bed)           General bed mobility comments: up bed with two max assist    Transfers                 General transfer comment: deferred pt request  Ambulation/Gait             General Gait Details: unable   Stairs             Wheelchair Mobility    Modified Rankin (Stroke Patients Only)       Balance                                             Cognition Arousal/Alertness: Awake/alert Behavior During Therapy: Flat affect Overall Cognitive Status: History of cognitive impairments - at baseline                                        Exercises      General Comments General comments (skin integrity, edema, etc.): pt is in bed with grimace, assisted to reposition with several pillows to support RUE and avoid L sidebend collapse of posture      Pertinent Vitals/Pain Pain Assessment: Faces Faces Pain Scale: Hurts little more Pain Location: RUE Pain Descriptors / Indicators: Grimacing;Guarding Pain Intervention(s): Limited activity within patient's tolerance;Monitored during session;Premedicated before session;Repositioned    Home Living                      Prior Function            PT Goals (current goals can now be found in the care plan section)      Frequency    7X/week  PT Plan Current plan remains appropriate    Co-evaluation              AM-PAC PT "6 Clicks" Mobility   Outcome Measure  Help needed turning from your back to your side while in a flat bed without using bedrails?: A Lot Help needed moving from lying on your back to sitting on the side of a flat bed without using bedrails?: A Lot Help needed moving to and from a bed to a chair (including a wheelchair)?: A Lot Help needed standing up from a chair using your arms (e.g., wheelchair or bedside chair)?: A Lot Help needed to walk in hospital room?: Total Help needed climbing 3-5 steps with a railing? : Total 6 Click Score: 10    End of Session   Activity Tolerance: Patient limited by pain;Treatment limited secondary to medical complications (Comment) Patient left: in bed;with call bell/phone within reach;with bed alarm set;with family/visitor present Nurse Communication: Mobility status PT Visit Diagnosis: Muscle weakness (generalized)  (M62.81);Pain Pain - Right/Left: Right Pain - part of body: Arm;Hip     Time: 2202-5427 PT Time Calculation (min) (ACUTE ONLY): 10 min  Charges:  $Therapeutic Activity: 8-22 mins                 Ivar Drape 12/12/2020, 4:16 PM  Samul Dada, PT MS Acute Rehab Dept. Number: [facility] [id number] and [facility] [phone]

## 2020-12-12 NOTE — Anesthesia Procedure Notes (Addendum)
Spinal  Patient location during procedure: OR Start time: 12/11/2020 3:17 PM End time: 12/11/2020 3:28 PM Reason for block: surgical anesthesia Staffing Performed: resident/CRNA  Anesthesiologist: Martha Clan, MD Resident/CRNA: Tollie Eth, CRNA Preanesthetic Checklist Completed: patient identified, IV checked, site marked, risks and benefits discussed, surgical consent, monitors and equipment checked, pre-op evaluation and timeout performed Spinal Block Patient position: left lateral decubitus Prep: ChloraPrep Patient monitoring: heart rate, continuous pulse ox, blood pressure and cardiac monitor Approach: midline Location: L3-4 Injection technique: single-shot Needle Needle type: Whitacre and Introducer  Needle gauge: 24 G Needle length: 9 cm Assessment Sensory level: T10 Events: CSF return Additional Notes Negative paresthesia. Negative blood return. Positive free-flowing CSF. Expiration date of kit checked and confirmed. Patient tolerated procedure well, without complications.

## 2020-12-12 NOTE — Plan of Care (Signed)
  Problem: Nutrition: Goal: Adequate nutrition will be maintained Outcome: Not Progressing   

## 2020-12-12 NOTE — Progress Notes (Signed)
PT Cancellation Note  Patient Details Name: Crystal Newton MRN: [id number] DOB: 19-Dec-1945   Cancelled Treatment:    Reason Eval/Treat Not Completed: Other (comment).  Attempting to see when MD came in to see pt.  Retry at another time.   Ivar Drape 12/12/2020, 9:06 AM  Samul Dada, PT MS Acute Rehab Dept. Number: [facility] [id number] and [facility] [phone]

## 2020-12-12 NOTE — Progress Notes (Signed)
PROGRESS NOTE    Crystal Newton  GYB:[id number] DOB: [birth year] DOA: 12/11/2020 PCP: Alm Bustard, NP   Assessment & Plan:   Principal Problem:   Closed right hip fracture (HCC) Active Problems:   Hyperlipidemia LDL goal <100   MDD (major depressive disorder), recurrent episode, severe (HCC)   Essential hypertension, benign   Dementia, vascular, mixed, with behavioral disturbance (HCC)   CAP (community acquired pneumonia)   Stroke (HCC)   Humerus fracture-right   Closed right hip fracture: s/p right hip unipolar hemiarthroplasty. Percocet, morphine prn. PT/OT consulted   HLD: continue on statin    Severe major depressive disorder: continue on home dose of vortioxetine, risperdal, depakote  HTN: continue on home dose of propranolol, lisinopril    Dementia: likely vascular. Continue on home dose of namenda, donepezil    CAP:  continue on IV rocephin, azithromycin, bronchodilators and encourage incentive spirometry. Legionella, strep are ordered.   Leukocytosis: likely secondary to above infection. Continue on IV abxs  Normocytic anemia: w/ component of acute blood loss anemia likely secondary to recent surgery. No need for a transfusion currently   Hypokalemia: KCL repleated  CVA: continue on home dose of statin, aspirin   Recent right humerus fracture:  continue w/ sling  DM2: well controlled. Diet controlled     DVT prophylaxis: lovenox  Code Status: DNR Family Communication: discussed pt's care w/ pt's family at bedside and answered their questions  Disposition Plan: depends on PT/OT recs  Level of care: Med-Surg  Status is: Inpatient  Remains inpatient appropriate because:Unsafe d/c plan, IV treatments appropriate due to intensity of illness or inability to take PO, and Inpatient level of care appropriate due to severity of illness  Dispo: The patient is from: Home              Anticipated d/c is to: SNF              Patient currently is not medically  stable to d/c.   Difficult to place patient : unclear    Consultants:  Ortho surg   Procedures:   Antimicrobials:    Subjective: Pt is lethargic and does not verbalized any complaints  Objective: Vitals:   12/11/20 1850 12/11/20 2111 12/11/20 2314 12/12/20 0445  BP: (!) 148/58 140/71 (!) 121/58 (!) 148/72  Pulse: 80 89 77 75  Resp: 16 16 16 16   Temp: 98.9 F (37.2 C) 97.9 F (36.6 C) 97.7 F (36.5 C) 97.9 F (36.6 C)  TempSrc:      SpO2: 92% 98% 94% 91%  Weight:      Height:        Intake/Output Summary (Last 24 hours) at 12/12/2020 0737 Last data filed at 12/12/2020 0600 Gross per 24 hour  Intake 1450 ml  Output 1320 ml  Net 130 ml   Filed Weights   12/11/20 0817  Weight: 60.8 kg    Examination:  General exam: Appears calm and comfortable  Respiratory system: Clear to auscultation. Respiratory effort normal. Cardiovascular system: S1 & S2+. No rubs, gallops or clicks.  Gastrointestinal system: Abdomen is nondistended, soft and nontender. Hypoactive bowel sounds heard. Central nervous system: Lethargic. Moves all extremities  Psychiatry: Judgement and insight appear abnormal. Flat mood and affect.     Data Reviewed: I have personally reviewed following labs and imaging studies  CBC: Recent Labs  Lab 12/11/20 0821 12/12/20 0459  WBC 13.7* 11.4*  NEUTROABS 12.3*  --   HGB 10.4* 8.3*  HCT 31.9* 24.9*  MCV 95.5 95.8  PLT 405* 310   Basic Metabolic Panel: Recent Labs  Lab 12/11/20 0821 12/12/20 0459  NA 140 137  K 3.8 3.3*  CL 101 104  CO2 31 27  GLUCOSE 113* 135*  BUN 15 10  CREATININE 0.59 0.35*  CALCIUM 8.8* 8.1*   GFR: Estimated Creatinine Clearance: 50.3 mL/min (A) (by C-G formula based on SCr of 0.35 mg/dL (L)). Liver Function Tests: No results for input(s): AST, ALT, ALKPHOS, BILITOT, PROT, ALBUMIN in the last 168 hours. No results for input(s): LIPASE, AMYLASE in the last 168 hours. No results for input(s): AMMONIA in the last  168 hours. Coagulation Profile: Recent Labs  Lab 12/11/20 0821  INR 1.0   Cardiac Enzymes: Recent Labs  Lab 12/11/20 0821  CKTOTAL 76   BNP (last 3 results) No results for input(s): PROBNP in the last 8760 hours. HbA1C: No results for input(s): HGBA1C in the last 72 hours. CBG: Recent Labs  Lab 12/11/20 1717  GLUCAP 125*   Lipid Profile: No results for input(s): CHOL, HDL, LDLCALC, TRIG, CHOLHDL, LDLDIRECT in the last 72 hours. Thyroid Function Tests: No results for input(s): TSH, T4TOTAL, FREET4, T3FREE, THYROIDAB in the last 72 hours. Anemia Panel: No results for input(s): VITAMINB12, FOLATE, FERRITIN, TIBC, IRON, RETICCTPCT in the last 72 hours. Sepsis Labs: Recent Labs  Lab 12/11/20 8119  LATICACIDVEN 0.8    Recent Results (from the past 240 hour(s))  Resp Panel by RT-PCR (Flu A&B, Covid) Nasopharyngeal Swab     Status: None   Collection Time: 12/11/20  9:52 AM   Specimen: Nasopharyngeal Swab; Nasopharyngeal(NP) swabs in vial transport medium  Result Value Ref Range Status   SARS Coronavirus 2 by RT PCR NEGATIVE NEGATIVE Final    Comment: (NOTE) SARS-CoV-2 target nucleic acids are NOT DETECTED.  The SARS-CoV-2 RNA is generally detectable in upper respiratory specimens during the acute phase of infection. The lowest concentration of SARS-CoV-2 viral copies this assay can detect is 138 copies/mL. A negative result does not preclude SARS-Cov-2 infection and should not be used as the sole basis for treatment or other patient management decisions. A negative result may occur with  improper specimen collection/handling, submission of specimen other than nasopharyngeal swab, presence of viral mutation(s) within the areas targeted by this assay, and inadequate number of viral copies(<138 copies/mL). A negative result must be combined with clinical observations, patient history, and epidemiological information. The expected result is Negative.  Fact Sheet for  Patients:  BloggerCourse.com  Fact Sheet for Healthcare Providers:  SeriousBroker.it  This test is no t yet approved or cleared by the [place] FDA and  has been authorized for detection and/or diagnosis of SARS-CoV-2 by FDA under an Emergency Use Authorization (EUA). This EUA will remain  in effect (meaning this test can be used) for the duration of the COVID-19 declaration under Section 564(b)(1) of the Act, 21 U.S.C.section 360bbb-3(b)(1), unless the authorization is terminated  or revoked sooner.       Influenza A by PCR NEGATIVE NEGATIVE Final   Influenza B by PCR NEGATIVE NEGATIVE Final    Comment: (NOTE) The Xpert Xpress SARS-CoV-2/FLU/RSV plus assay is intended as an aid in the diagnosis of influenza from Nasopharyngeal swab specimens and should not be used as a sole basis for treatment. Nasal washings and aspirates are unacceptable for Xpert Xpress SARS-CoV-2/FLU/RSV testing.  Fact Sheet for Patients: BloggerCourse.com  Fact Sheet for Healthcare Providers: SeriousBroker.it  This test is not yet approved or cleared by the  [place] [employer] trader and has been authorized for detection and/or diagnosis of SARS-CoV-2 by FDA under an TEFL teacher (EUA). This EUA will remain in effect (meaning this test can be used) for the duration of the COVID-19 declaration under Section 564(b)(1) of the Act, 21 U.S.C. section 360bbb-3(b)(1), unless the authorization is terminated or revoked.  Performed at [facility], [address], [place], [place] [address]          Radiology Studies: CT HEAD WO CONTRAST ( )  Result Date: 12/11/2020 CLINICAL DATA:  Head trauma, minor (Age >= 65y) EXAM: CT HEAD WITHOUT CONTRAST TECHNIQUE: Contiguous axial images were obtained from the base of the skull through the vertex without intravenous contrast. COMPARISON:   11/24/2020 FINDINGS: Brain: No evidence of acute infarction, hemorrhage, hydrocephalus, extra-axial collection or mass lesion/mass effect. Mild low-density changes within the periventricular and subcortical white matter compatible with chronic microvascular ischemic change. Mild diffuse cerebral volume loss. Vascular: Atherosclerotic calcifications involving the large vessels of the skull base. No unexpected hyperdense vessel. Skull: Normal. Negative for fracture or focal lesion. Sinuses/Orbits: No acute finding. Other: None. IMPRESSION: 1. No acute intracranial findings. 2. Chronic microvascular ischemic change and cerebral volume loss. Electronically Signed   By: Duanne Guess D.O.   On: 12/11/2020 12:34   DG Chest Port 1 View  Result Date: 12/11/2020 CLINICAL DATA:  75 year old female with history of right-sided hip pain after a fall in the bathroom today. EXAM: PORTABLE CHEST 1 VIEW COMPARISON:  Chest x-ray 08/18/2020. FINDINGS: Several patchy opacities are noted in the right upper lobe, including a band like opacity immediately above the minor fissure which appears superiorly displaced. Left lung is clear. No pleural effusions. No pneumothorax. No evidence of pulmonary edema. Heart size is normal. Upper mediastinal contours are within normal limits allowing for patient rotation the left. Atherosclerotic calcifications in the thoracic aorta. IMPRESSION: 1. Patchy opacities in the right upper lobe which likely reflect a combination of airspace consolidation and atelectasis concerning for developing bronchopneumonia. Followup PA and lateral chest X-ray is recommended in 3-4 weeks following trial of antibiotic therapy to ensure resolution and exclude underlying malignancy. 2. Aortic atherosclerosis. Electronically Signed   By: Trudie Reed M.D.   On: 12/11/2020 09:27   DG HIP UNILAT W OR W/O PELVIS 2-3 VIEWS RIGHT  Result Date: 12/11/2020 CLINICAL DATA:  Postop right hip surgery EXAM: DG HIP (WITH OR  WITHOUT PELVIS) 2-3V RIGHT COMPARISON:  Preop radiographs dated 12/11/2020 FINDINGS: Right hip hemiarthroplasty in satisfactory position. Overlying skin staples. Visualized bony pelvis appears intact IMPRESSION: Right hip hemiarthroplasty in satisfactory position. Electronically Signed   By: Charline Bills M.D.   On: 12/11/2020 19:12   DG Hip Unilat  With Pelvis 2-3 Views Right  Result Date: 12/11/2020 CLINICAL DATA:  Right hip pain.  Fall. EXAM: DG HIP (WITH OR WITHOUT PELVIS) 2-3V RIGHT COMPARISON:  None. FINDINGS: Acute displaced fracture involving the proximal right femur. The fracture appears to be at the junction of the femoral head and neck and suggestive for a subcapital fracture. The right femoral neck is superiorly displaced. Right femoral head appears to be located. Pelvic bony ring is intact. Normal appearance of the left hip. IMPRESSION: Displaced right subcapital femur fracture. Electronically Signed   By: Richarda Overlie M.D.   On: 12/11/2020 09:26        Scheduled Meds:  acetaminophen  500 mg Oral Q6H   aspirin EC  81 mg Oral Daily   atorvastatin  80 mg Oral  Daily   calcium carbonate  1 tablet Oral Q breakfast   cholecalciferol  1,000 Units Oral Daily   divalproex  125 mg Oral TID   docusate sodium  100 mg Oral BID   donepezil  10 mg Oral QHS   enoxaparin (LOVENOX) injection  40 mg Subcutaneous Q24H   feeding supplement  237 mL Oral BID BM   lisinopril  5 mg Oral Daily   melatonin  30 mg Oral QHS   memantine  2.5 mg Oral BID   multivitamin with minerals  1 tablet Oral Daily   multivitamin with minerals  1 tablet Oral Daily   pantoprazole  40 mg Oral Daily   propranolol  40 mg Oral BID   risperiDONE  0.5 mg Oral BID   And   risperiDONE  1 mg Oral Daily   vitamin B-12  1,000 mcg Oral Daily   vortioxetine HBr  20 mg Oral Q supper   Continuous Infusions:  sodium chloride     sodium chloride 75 mL/hr at 12/11/20 1906   azithromycin (ZITHROMAX) 500 MG IVPB (Vial-Mate  Adaptor)      ceFAZolin (ANCEF) IV 2 g (12/12/20 0311)   cefTRIAXone (ROCEPHIN)  IV       LOS: 1 day    Time spent: 35 mins     Charise KillianJamiese M Trish Mancinelli, MD [facility] Pager [phone]  If 7PM-7AM, please contact night-coverage 12/12/2020, 7:37 AM

## 2020-12-13 ENCOUNTER — Inpatient Hospital Stay: Payer: Medicare Other

## 2020-12-13 LAB — BASIC METABOLIC PANEL
Anion gap: 8 (ref 5–15)
BUN: 10 mg/dL (ref 8–23)
CO2: 27 mmol/L (ref 22–32)
Calcium: 8.3 mg/dL — ABNORMAL LOW (ref 8.9–10.3)
Chloride: 104 mmol/L (ref 98–111)
Creatinine, Ser: 0.41 mg/dL — ABNORMAL LOW (ref 0.44–1.00)
GFR, Estimated: >60 mL/min (ref >60)
Glucose, Bld: 126 mg/dL — ABNORMAL HIGH (ref 70–99)
Potassium: 3.4 mmol/L — ABNORMAL LOW (ref 3.5–5.1)
Sodium: 139 mmol/L (ref 135–145)

## 2020-12-13 LAB — CBC
HCT: 24.6 % — ABNORMAL LOW (ref 36.0–46.0)
Hemoglobin: 8.2 g/dL — ABNORMAL LOW (ref 12.0–15.0)
MCH: 31.5 pg (ref 26.0–34.0)
MCHC: 33.3 g/dL (ref 30.0–36.0)
MCV: 94.6 fL (ref 80.0–100.0)
Platelets: 318 10*3/uL (ref 150–400)
RBC: 2.6 MIL/uL — ABNORMAL LOW (ref 3.87–5.11)
RDW: 14.2 % (ref 11.5–15.5)
WBC: 11.9 10*3/uL — ABNORMAL HIGH (ref 4.0–10.5)
nRBC: 0 % (ref 0.0–0.2)

## 2020-12-13 MED ORDER — POTASSIUM CHLORIDE CRYS ER 20 MEQ PO TBCR
20.0000 meq | EXTENDED_RELEASE_TABLET | Freq: Once | ORAL | Status: AC
  Administered 2020-12-13: 20 meq via ORAL
  Filled 2020-12-13: qty 1

## 2020-12-13 NOTE — Progress Notes (Signed)
Physical Therapy Treatment Patient Details Name: Crystal Newton MRN: [id number] DOB: April 06, 1946 Today's Date: 12/13/2020    History of Present Illness 75 yo female with recent fall at home resulted in RUE humeral fracture, now with another fall at home and R displaced femoral neck fracture.  Pt received R hemiarthroplasty, NWB on RUE and WBAT RLE with posterior precautions.  PMHx:  HTN, HLD, DM, stroke, TIA, GERD, depression, anxiety, migraine, dementia, kidney stone, PAD, falls    PT Comments    Participated in exercises as described below.  To EOB with min/mod a x 1.  Good effort to assist today.  Steady in  sitting with no dizziness noted.  Stood with min/mod a x 1 and is able to take several small steps to recliner today. Excellent effort today and pleased to be OOB in chair.     Follow Up Recommendations  SNF     Equipment Recommendations  None recommended by PT    Recommendations for Other Services       Precautions / Restrictions Precautions Precautions: Fall;Posterior Hip Precaution Booklet Issued: No Precaution Comments: verbally reviewed Required Braces or Orthoses: Other Brace;Sling Other Brace: using pillow to maintain precautions Restrictions Weight Bearing Restrictions: Yes RUE Weight Bearing: Non weight bearing RLE Weight Bearing: Weight bearing as tolerated Other Position/Activity Restrictions: sling on RUE    Mobility  Bed Mobility Overal bed mobility: Needs Assistance       Supine to sit: Min assist;Mod assist          Transfers Overall transfer level: Needs assistance Equipment used: 1 person hand held assist Transfers: Sit to/from Stand Sit to Stand: Min assist;Mod assist            Ambulation/Gait Ambulation/Gait assistance: Min assist;Mod assist Gait Distance (Feet): 3 Feet Assistive device: 1 person hand held assist Gait Pattern/deviations: Step-to pattern     General Gait Details: able to take good steps to recliner   Stairs              Wheelchair Mobility    Modified Rankin (Stroke Patients Only)       Balance Overall balance assessment: Needs assistance Sitting-balance support: Feet supported Sitting balance-Leahy Scale: Fair     Standing balance support: Single extremity supported;During functional activity Standing balance-Leahy Scale: Fair                              Cognition Arousal/Alertness: Awake/alert Behavior During Therapy: WFL for tasks assessed/performed Overall Cognitive Status: History of cognitive impairments - at baseline                                        Exercises Other Exercises Other Exercises: BLE Supine AAROM x 10    General Comments        Pertinent Vitals/Pain Pain Assessment: Faces Faces Pain Scale: Hurts little more Pain Location: RUE Pain Descriptors / Indicators: Grimacing;Guarding Pain Intervention(s): Limited activity within patient's tolerance;Monitored during session;Premedicated before session;Repositioned    Home Living                      Prior Function            PT Goals (current goals can now be found in the care plan section) Progress towards PT goals: Progressing toward goals    Frequency  7X/week      PT Plan Current plan remains appropriate    Co-evaluation              AM-PAC PT "6 Clicks" Mobility   Outcome Measure  Help needed turning from your back to your side while in a flat bed without using bedrails?: A Lot Help needed moving from lying on your back to sitting on the side of a flat bed without using bedrails?: A Lot Help needed moving to and from a bed to a chair (including a wheelchair)?: A Lot Help needed standing up from a chair using your arms (e.g., wheelchair or bedside chair)?: A Lot Help needed to walk in hospital room?: A Lot Help needed climbing 3-5 steps with a railing? : Total 6 Click Score: 11    End of Session Equipment Utilized During  Treatment: Gait belt Activity Tolerance: Patient tolerated treatment well Patient left: in chair;with call bell/phone within reach;with chair alarm set Nurse Communication: Mobility status PT Visit Diagnosis: Muscle weakness (generalized) (M62.81);Pain Pain - Right/Left: Right Pain - part of body: Arm;Hip     Time: 1040-1056 PT Time Calculation (min) (ACUTE ONLY): 16 min  Charges:  $Therapeutic Activity: 8-22 mins                    Danielle Dess, PTA 12/13/20, 11:04 AM , 11:03 AM

## 2020-12-13 NOTE — TOC Initial Note (Signed)
Transition of Care [facility]) - Initial/Assessment Note    Patient Details  Name: Crystal Newton MRN: [id number] Date of Birth: 10-22-1945  Transition of [facility]) CM/SW Contact:    Su Hilt, RN Phone Number: 12/13/2020, 10:05 AM  Clinical Narrative:      Met with the patient and her spouse in the room to discuss STR SNF, she has not been in the past   but is open to the idea of going, would like to do bedsearch in [place] and [place] area, PASSR obtainedm FL2 completed, bedsearch sent                 Patient Goals and CMS Choice        Expected Discharge Plan and Services                                                Prior Living Arrangements/Services                       Activities of Daily Living      Permission Sought/Granted                  Emotional Assessment              Admission diagnosis:  Fall [W19.XXXA] Closed right hip fracture ([place]) [S72.001A] Fall, initial encounter [W19.XXXA] Closed fracture of right hip, initial encounter ([place]) [S72.001A] Community acquired pneumonia of right lung, unspecified part of lung [J18.9] Patient Active Problem List   Diagnosis Date Noted   Closed right hip fracture ([place]) 12/11/2020   Stroke ([place]) 12/11/2020   Humerus fracture-right 12/11/2020   Fall    Cold intolerance 12/12/2019   Acute pain of right shoulder 12/12/2019   Acute thoracic back pain 12/14/2018   Weight loss, unintentional 09/28/2018   Chronic insomnia 03/14/2017   Digital mucinous cyst of finger of right hand 09/09/2016   Bilateral hip bursitis 03/08/2016   Right rotator cuff tendinitis 06/30/2015   CAP (community acquired pneumonia) 06/16/2015   Counseling regarding end of life decision making 02/03/2015   Dementia, vascular, mixed, with behavioral disturbance ([place]) 06/14/2012   OSTEOARTHROSIS UNSPEC WHETHER GEN/LOCALIZED HAND 01/14/2008   Diabetes mellitus with no complication ([place]) 40/34/7425    Hyperlipidemia LDL goal <100 12/21/2006   Generalized anxiety disorder 12/21/2006   MDD (major depressive disorder), recurrent episode, severe ([place]) 12/21/2006   COMMON MIGRAINE 12/21/2006   Essential hypertension, benign 12/21/2006   PUD 12/21/2006   PCP:  Peggye Form, NP Pharmacy:   [id number] [id number]: [phone]: [phone]    Social Determinants of Health (SDOH) Interventions    Readmission Risk Interventions No flowsheet data found.

## 2020-12-13 NOTE — Anesthesia Postprocedure Evaluation (Signed)
Anesthesia Post Note  Patient: VERDELLE VALTIERRA  Procedure(s) Performed: ARTHROPLASTY BIPOLAR HIP (HEMIARTHROPLASTY) (Right: Hip)  Patient location during evaluation: Nursing Unit Anesthesia Type: Spinal Level of consciousness: oriented and awake and alert Pain management: pain level controlled Vital Signs Assessment: post-procedure vital signs reviewed and stable Respiratory status: spontaneous breathing and respiratory function stable Cardiovascular status: blood pressure returned to baseline and stable Postop Assessment: no headache, no backache, patient able to bend at knees, adequate PO intake and no apparent nausea or vomiting (Patient has not been up to walk with PT yet) Anesthetic complications: no   No notable events documented.   Last Vitals:  Vitals:   12/13/20 0536 12/13/20 0810  BP: (!) 128/57 (!) 111/58  Pulse: 88 71  Resp: 17 16  Temp: 36.8 C 36.9 C  SpO2: 96% 95%    Last Pain:  Vitals:   12/12/20 1930  TempSrc:   PainSc: 0-No pain                 Lenard Simmer

## 2020-12-13 NOTE — Progress Notes (Addendum)
PROGRESS NOTE    Crystal Newton  LKG:[id number] DOB: 1945/07/29 DOA: 12/11/2020 PCP: Alm Bustard, NP   Assessment & Plan:   Principal Problem:   Closed right hip fracture (HCC) Active Problems:   Hyperlipidemia LDL goal <100   MDD (major depressive disorder), recurrent episode, severe (HCC)   Essential hypertension, benign   Dementia, vascular, mixed, with behavioral disturbance (HCC)   CAP (community acquired pneumonia)   Stroke (HCC)   Humerus fracture-right   Closed right hip fracture: s/p right hip unipolar hemiarthroplasty. Percocet, morphine prn. PT/OT consulted   HLD: continue on statin    Severe major depressive disorder: continue on home dose of vortioxetine, risperdal, depakote  HTN: continue on home dose of lisinopril, propranolol    Dementia: likely vascular. Continue on home dose of namenda, donepezil    CAP: continue on IV rocephin, azithromycin, bronchodilators. Encourage incentive spirometry. Legionella, strep are ordered but not collect yet   Leukocytosis: continue on IV abxs. Secondary to above infection   Normocytic anemia: w/ component of acute blood loss anemia likely secondary to recent surgery. H&H are stable   Hypokalemia: potassium given. Will continue to monitor   CVA: continue on aspirin, statin    Recent right humerus fracture: continue w/ sling   DM2: well controlled. Diet controlled     DVT prophylaxis: lovenox  Code Status: DNR Family Communication: discussed pt's care w/ pt's family at bedside and answered their questions  Disposition Plan: d/c to SNF  Level of care: Med-Surg  Status is: Inpatient  Remains inpatient appropriate because:Unsafe d/c plan, IV treatments appropriate due to intensity of illness or inability to take PO, and Inpatient level of care appropriate due to severity of illness  Dispo: The patient is from: Home              Anticipated d/c is to: SNF              Patient currently is not medically stable  to d/c.   Difficult to place patient : unclear    Consultants:  Ortho surg   Procedures:   Antimicrobials:    Subjective: Pt is pleasantly confused   Objective: Vitals:   12/12/20 1612 12/12/20 2134 12/13/20 0536 12/13/20 0810  BP: (!) 128/54 102/61 (!) 128/57 (!) 111/58  Pulse: 90 83 88 71  Resp: 18 18 17 16   Temp:  97.6 F (36.4 C) 98.2 F (36.8 C) 98.4 F (36.9 C)  TempSrc:      SpO2: 94% 96% 96% 95%  Weight:      Height:        Intake/Output Summary (Last 24 hours) at 12/13/2020 1258 Last data filed at 12/13/2020 1241 Gross per 24 hour  Intake 2948.49 ml  Output --  Net 2948.49 ml   Filed Weights   12/11/20 0817  Weight: 60.8 kg    Examination:  General exam: Appears comfortable  Respiratory system: diminished breath sounds b/l  Cardiovascular system: S1/2+. No rubs or clicks  Gastrointestinal system: Abd is soft, NT, ND & hypoactive bowel sounds Central nervous system: Lethargic. Moves all extremities  Psychiatry: Judgement and insight appear abnormal. Flat mood and affect     Data Reviewed: I have personally reviewed following labs and imaging studies  CBC: Recent Labs  Lab 12/11/20 0821 12/12/20 0459 12/13/20 0645  WBC 13.7* 11.4* 11.9*  NEUTROABS 12.3*  --   --   HGB 10.4* 8.3* 8.2*  HCT 31.9* 24.9* 24.6*  MCV 95.5 95.8 94.6  PLT 405* 310 318   Basic Metabolic Panel: Recent Labs  Lab 12/11/20 0821 12/12/20 0459 12/13/20 0645  NA 140 137 139  K 3.8 3.3* 3.4*  CL 101 104 104  CO2 31 27 27   GLUCOSE 113* 135* 126*  BUN 15 10 10   CREATININE 0.59 0.35* 0.41*  CALCIUM 8.8* 8.1* 8.3*   GFR: Estimated Creatinine Clearance: 50.3 mL/min (A) (by C-G formula based on SCr of 0.41 mg/dL (L)). Liver Function Tests: No results for input(s): AST, ALT, ALKPHOS, BILITOT, PROT, ALBUMIN in the last 168 hours. No results for input(s): LIPASE, AMYLASE in the last 168 hours. No results for input(s): AMMONIA in the last 168 hours. Coagulation  Profile: Recent Labs  Lab 12/11/20 0821  INR 1.0   Cardiac Enzymes: Recent Labs  Lab 12/11/20 0821  CKTOTAL 76   BNP (last 3 results) No results for input(s): PROBNP in the last 8760 hours. HbA1C: No results for input(s): HGBA1C in the last 72 hours. CBG: Recent Labs  Lab 12/11/20 1717  GLUCAP 125*   Lipid Profile: No results for input(s): CHOL, HDL, LDLCALC, TRIG, CHOLHDL, LDLDIRECT in the last 72 hours. Thyroid Function Tests: No results for input(s): TSH, T4TOTAL, FREET4, T3FREE, THYROIDAB in the last 72 hours. Anemia Panel: No results for input(s): VITAMINB12, FOLATE, FERRITIN, TIBC, IRON, RETICCTPCT in the last 72 hours. Sepsis Labs: Recent Labs  Lab 12/11/20 12/13/20  LATICACIDVEN 0.8    Recent Results (from the past 240 hour(s))  Resp Panel by RT-PCR (Flu A&B, Covid) Nasopharyngeal Swab     Status: None   Collection Time: 12/11/20  9:52 AM   Specimen: Nasopharyngeal Swab; Nasopharyngeal(NP) swabs in vial transport medium  Result Value Ref Range Status   SARS Coronavirus 2 by RT PCR NEGATIVE NEGATIVE Final    Comment: (NOTE) SARS-CoV-2 target nucleic acids are NOT DETECTED.  The SARS-CoV-2 RNA is generally detectable in upper respiratory specimens during the acute phase of infection. The lowest concentration of SARS-CoV-2 viral copies this assay can detect is 138 copies/mL. A negative result does not preclude SARS-Cov-2 infection and should not be used as the sole basis for treatment or other patient management decisions. A negative result may occur with  improper specimen collection/handling, submission of specimen other than nasopharyngeal swab, presence of viral mutation(s) within the areas targeted by this assay, and inadequate number of viral copies(<138 copies/mL). A negative result must be combined with clinical observations, patient history, and epidemiological information. The expected result is Negative.  Fact Sheet for Patients:   7989  Fact Sheet for Healthcare Providers:  12/13/20  This test is no t yet approved or cleared by the [employer] FDA and  has been authorized for detection and/or diagnosis of SARS-CoV-2 by FDA under an Emergency Use Authorization (EUA). This EUA will remain  in effect (meaning this test can be used) for the duration of the COVID-19 declaration under Section 564(b)(1) of the Act, 21 U.S.C.section 360bbb-3(b)(1), unless the authorization is terminated  or revoked sooner.       Influenza A by PCR NEGATIVE NEGATIVE Final   Influenza B by PCR NEGATIVE NEGATIVE Final    Comment: (NOTE) The Xpert Xpress SARS-CoV-2/FLU/RSV plus assay is intended as an aid in the diagnosis of influenza from Nasopharyngeal swab specimens and should not be used as a sole basis for treatment. Nasal washings and aspirates are unacceptable for Xpert Xpress SARS-CoV-2/FLU/RSV testing.  Fact Sheet for Patients: [employer]  Fact Sheet for Healthcare Providers: [place]  This test is  not yet approved or cleared by the [place] and has been authorized for detection and/or diagnosis of SARS-CoV-2 by FDA under an Emergency Use Authorization (EUA). This EUA will remain in effect (meaning this test can be used) for the duration of the COVID-19 declaration under Section 564(b)(1) of the Act, 21 U.S.C. section 360bbb-3(b)(1), unless the authorization is terminated or revoked.  Performed at [facility], [address] [place], [place] [address]   Blood culture (single)     Status: None (Preliminary result)   Collection Time: 12/11/20  9:52 AM   Specimen: BLOOD  Result Value Ref Range Status   Specimen Description BLOOD LEFT ANTECUBITAL  Final   Special Requests   Final    BOTTLES DRAWN AEROBIC AND ANAEROBIC Blood Culture results may not be optimal due  to an inadequate volume of blood received in culture bottles   Culture   Final    NO GROWTH 2 DAYS Performed at [facility], [address] [place], [place] [address]    Report Status PENDING  Incomplete         Radiology Studies: DG HIP UNILAT W OR W/O PELVIS 2-3 VIEWS RIGHT  Result Date: 12/11/2020 CLINICAL DATA:  Postop right hip surgery EXAM: DG HIP (WITH OR WITHOUT PELVIS) 2-3V RIGHT COMPARISON:  Preop radiographs dated 12/11/2020 FINDINGS: Right hip hemiarthroplasty in satisfactory position. Overlying skin staples. Visualized bony pelvis appears intact IMPRESSION: Right hip hemiarthroplasty in satisfactory position. Electronically Signed   By: Charline Bills M.D.   On: 12/11/2020 19:12        Scheduled Meds:  aspirin EC  81 mg Oral Daily   atorvastatin  80 mg Oral Daily   calcium carbonate  1 tablet Oral Q breakfast   cholecalciferol  1,000 Units Oral Daily   divalproex  125 mg Oral TID   docusate sodium  100 mg Oral BID   donepezil  10 mg Oral QHS   enoxaparin (LOVENOX) injection  40 mg Subcutaneous Q24H   feeding supplement  237 mL Oral BID BM   lisinopril  5 mg Oral Daily   melatonin  30 mg Oral QHS   memantine  2.5 mg Oral BID   multivitamin with minerals  1 tablet Oral Daily   multivitamin with minerals  1 tablet Oral Daily   pantoprazole  40 mg Oral Daily   potassium chloride  20 mEq Oral Once   propranolol  40 mg Oral BID   risperiDONE  0.5 mg Oral BID   And   risperiDONE  1 mg Oral Daily   vitamin B-12  1,000 mcg Oral Daily   vortioxetine HBr  20 mg Oral Q supper   Continuous Infusions:  sodium chloride 75 mL/hr at 12/13/20 1024   azithromycin (ZITHROMAX) 500 MG IVPB (Vial-Mate [employer]) 500 mg (12/13/20 1024)   cefTRIAXone (ROCEPHIN)  IV 1 g (12/13/20 1225)     LOS: 2 days    Time spent: 30 mins     Charise Killian, MD [facility] Pager [phone]  If 7PM-7AM, please contact night-coverage 12/13/2020, 12:58 PM

## 2020-12-13 NOTE — NC FL2 (Signed)
[place] MEDICAID FL2 LEVEL OF CARE SCREENING TOOL     IDENTIFICATION  Patient Name: Crystal Newton Birthdate: 1946/03/04 Sex: female Admission Date (Current Location): 12/11/2020  [facility] and [place] Number:  Chiropodist and Address:  [facility], [address] [place], [place] [address]      Provider Number: [id number]  Attending Physician Name and Address:  Charise Killian, MD  Relative Name and Phone Number:  Richarda Overlie spouse [phone]    Current Level of Care: Hospital Recommended Level of Care: Skilled Nursing Facility Prior Approval Number:    Date Approved/Denied:   PASRR Number: [id number]  Discharge Plan: SNF    Current Diagnoses: Patient Active Problem List   Diagnosis Date Noted   Closed right hip fracture (HCC) 12/11/2020   Stroke (HCC) 12/11/2020   Humerus fracture-right 12/11/2020   Fall    Cold intolerance 12/12/2019   Acute pain of right shoulder 12/12/2019   Acute thoracic back pain 12/14/2018   Weight loss, unintentional 09/28/2018   Chronic insomnia 03/14/2017   Digital mucinous cyst of finger of right hand 09/09/2016   Bilateral hip bursitis 03/08/2016   Right rotator cuff tendinitis 06/30/2015   CAP (community acquired pneumonia) 06/16/2015   Counseling regarding end of life decision making 02/03/2015   Dementia, vascular, mixed, with behavioral disturbance (HCC) 06/14/2012   OSTEOARTHROSIS UNSPEC WHETHER GEN/LOCALIZED HAND 01/14/2008   Diabetes mellitus with no complication (HCC) 12/21/2006   Hyperlipidemia LDL goal <100 12/21/2006   Generalized anxiety disorder 12/21/2006   MDD (major depressive disorder), recurrent episode, severe (HCC) 12/21/2006   COMMON MIGRAINE 12/21/2006   Essential hypertension, benign 12/21/2006   PUD 12/21/2006    Orientation RESPIRATION BLADDER Height & Weight     Self, Time, Situation, Place  Normal Continent, External catheter Weight: 60.8 kg Height:  5\' 3"  (160  cm)  BEHAVIORAL SYMPTOMS/MOOD NEUROLOGICAL BOWEL NUTRITION STATUS      Continent Diet (heart healthy)  AMBULATORY STATUS COMMUNICATION OF NEEDS Skin   Extensive Assist Verbally Normal, Surgical wounds                       Personal Care Assistance Level of Assistance  Bathing, Dressing Bathing Assistance: Limited assistance   Dressing Assistance: Limited assistance     Functional Limitations Info             SPECIAL CARE FACTORS FREQUENCY  PT (By licensed PT), OT (By licensed OT)     PT Frequency: 5 times per week OT Frequency: 5 times per week            Contractures Contractures Info: Not present    Additional Factors Info  Code Status, Allergies Code Status Info: DNR Allergies Info: NKDA           Current Medications (12/13/2020):  This is the current hospital active medication list Current Facility-Administered Medications  Medication Dose Route Frequency Provider Last Rate Last Admin   0.9 %  sodium chloride infusion   Intravenous Continuous Poggi, 12/15/2020, MD 75 mL/hr at 12/13/20 0124 New Bag at 12/13/20 0124   acetaminophen (TYLENOL) tablet 325-650 mg  325-650 mg Oral Q6H PRN Poggi, 12/15/20, MD       albuterol (PROVENTIL) (2.5 MG/3ML) 0.083% nebulizer solution 3 mL  3 mL Inhalation Q4H PRN Excell Seltzer, MD       aspirin EC tablet 81 mg  81 mg Oral Daily Lorretta Harp, MD   81 mg at 12/13/20  0957   atorvastatin (LIPITOR) tablet 80 mg  80 mg Oral Daily Lorretta Harp, MD   80 mg at 12/13/20 0957   azithromycin (ZITHROMAX) 500 mg in sodium chloride 0.9 % 250 mL IVPB  500 mg Intravenous Q24H Lorretta Harp, MD 250 mL/hr at 12/12/20 1315 500 mg at 12/12/20 1315   bisacodyl (DULCOLAX) suppository 10 mg  10 mg Rectal Daily PRN Poggi, Excell Seltzer, MD       calcium carbonate (OS-CAL - dosed in mg of elemental calcium) tablet 500 mg of elemental calcium  1 tablet Oral Q breakfast Lorretta Harp, MD   500 mg of elemental calcium at 12/13/20 0957   cefTRIAXone (ROCEPHIN) 1 g in sodium  chloride 0.9 % 100 mL IVPB  1 g Intravenous Q24H Lorretta Harp, MD 200 mL/hr at 12/12/20 1129 1 g at 12/12/20 1129   cholecalciferol (VITAMIN D3) tablet 1,000 Units  1,000 Units Oral Daily Lorretta Harp, MD   1,000 Units at 12/13/20 0957   dextromethorphan-guaiFENesin (MUCINEX DM) 30-600 MG per 12 hr tablet 1 tablet  1 tablet Oral BID PRN Lorretta Harp, MD       diphenhydrAMINE (BENADRYL) 12.5 MG/5ML elixir 12.5-25 mg  12.5-25 mg Oral Q4H PRN Poggi, Excell Seltzer, MD   25 mg at 12/12/20 2354   divalproex (DEPAKOTE) DR tablet 125 mg  125 mg Oral TID Lorretta Harp, MD   125 mg at 12/13/20 0957   docusate sodium (COLACE) capsule 100 mg  100 mg Oral BID Christena Flake, MD   100 mg at 12/13/20 0957   donepezil (ARICEPT) tablet 10 mg  10 mg Oral QHS Lorretta Harp, MD   10 mg at 12/12/20 2303   enoxaparin (LOVENOX) injection 40 mg  40 mg Subcutaneous Q24H Poggi, Excell Seltzer, MD   40 mg at 12/12/20 0958   feeding supplement (ENSURE ENLIVE / ENSURE PLUS) liquid 237 mL  237 mL Oral BID BM Poggi, Excell Seltzer, MD   237 mL at 12/12/20 1434   fentaNYL (SUBLIMAZE) injection 25 mcg  25 mcg Intravenous Q1H PRN Lorretta Harp, MD   25 mcg at 12/11/20 1122   hydrALAZINE (APRESOLINE) injection 5 mg  5 mg Intravenous Q2H PRN Lorretta Harp, MD       hyoscyamine (ANASPAZ) disintergrating tablet 0.375 mg  0.375 mg Oral Q12H PRN Lorretta Harp, MD       lisinopril (ZESTRIL) tablet 5 mg  5 mg Oral Daily Lorretta Harp, MD   5 mg at 12/13/20 0957   loratadine (CLARITIN) tablet 10 mg  10 mg Oral Daily PRN Lorretta Harp, MD       magnesium hydroxide (MILK OF MAGNESIA) suspension 30 mL  30 mL Oral Daily PRN Poggi, Excell Seltzer, MD       melatonin tablet 30 mg  30 mg Oral QHS Lorretta Harp, MD   30 mg at 12/12/20 2302   memantine (NAMENDA) tablet 2.5 mg  2.5 mg Oral BID Lorretta Harp, MD   2.5 mg at 12/13/20 0957   methocarbamol (ROBAXIN) tablet 500 mg  500 mg Oral Q8H PRN Lorretta Harp, MD   500 mg at 12/12/20 2353   metoCLOPramide (REGLAN) tablet 5-10 mg  5-10 mg Oral Q8H PRN Poggi, Excell Seltzer,  MD       Or   metoCLOPramide (REGLAN) injection 5-10 mg  5-10 mg Intravenous Q8H PRN Poggi, Excell Seltzer, MD       morphine 2 MG/ML injection 0.5-1 mg  0.5-1 mg Intravenous Q2H PRN Poggi, Jonny Ruiz  J, MD       multivitamin with minerals tablet 1 tablet  1 tablet Oral Daily Lorretta Harp, MD   1 tablet at 12/12/20 9470   multivitamin with minerals tablet 1 tablet  1 tablet Oral Daily Poggi, Excell Seltzer, MD   1 tablet at 12/13/20 0957   nitroGLYCERIN (NITROSTAT) SL tablet 0.4 mg  0.4 mg Sublingual Q5 min PRN Lorretta Harp, MD       ondansetron [facility]) tablet 4 mg  4 mg Oral Q6H PRN Poggi, Excell Seltzer, MD       Or   ondansetron [facility]) injection 4 mg  4 mg Intravenous Q6H PRN Poggi, Excell Seltzer, MD   4 mg at 12/12/20 0433   oxyCODONE-acetaminophen (PERCOCET/ROXICET) 5-325 MG per tablet 1 tablet  1 tablet Oral Q4H PRN Charise Killian, MD   1 tablet at 12/13/20 9628   pantoprazole (PROTONIX) EC tablet 40 mg  40 mg Oral Daily Lorretta Harp, MD   40 mg at 12/13/20 0957   propranolol (INDERAL) tablet 40 mg  40 mg Oral BID Lorretta Harp, MD   40 mg at 12/13/20 0957   risperiDONE (RISPERDAL) tablet 0.5 mg  0.5 mg Oral BID Sharen Hones, RPH   0.5 mg at 12/13/20 3662   And   risperiDONE (RISPERDAL) tablet 1 mg  1 mg Oral Daily Sharen Hones, RPH   1 mg at 12/12/20 1755   senna-docusate (Senokot-S) tablet 1 tablet  1 tablet Oral QHS PRN Lorretta Harp, MD       sodium phosphate (FLEET) 7-19 GM/118ML enema 1 enema  1 enema Rectal Once PRN Poggi, Excell Seltzer, MD       traMADol Janean Sark) tablet 50 mg  50 mg Oral Q6H PRN Poggi, Excell Seltzer, MD   50 mg at 12/11/20 2034   vitamin B-12 (CYANOCOBALAMIN) tablet 1,000 mcg  1,000 mcg Oral Daily Lorretta Harp, MD   1,000 mcg at 12/13/20 0957   vortioxetine HBr (TRINTELLIX) tablet 20 mg  20 mg Oral Q supper Sharen Hones, RPH   20 mg at 12/11/20 2227     Discharge Medications: Please see discharge summary for a list of discharge medications.  Relevant Imaging Results:  Relevant Lab  Results:   Additional Information SS# [id number]  Barrie Dunker, RN

## 2020-12-13 NOTE — Progress Notes (Addendum)
  Subjective: 2 Days Post-Op Procedure(s) (LRB): ARTHROPLASTY BIPOLAR HIP (HEMIARTHROPLASTY) (Right) Patient reports pain as well-controlled.  Husband at bedside.  Patient is well, and has had no acute complaints or problems Current PT recommendation is for SNF, but family has expressed desire to take her home. Negative for chest pain and shortness of breath Fever: no Gastrointestinal: negative for nausea and vomiting.   Objective: Vital signs in last 24 hours: Temp:  [97.6 F (36.4 C)-98.4 F (36.9 C)] 98.4 F (36.9 C) (08/28 0810) Pulse Rate:  [71-90] 71 (08/28 0810) Resp:  [16-18] 16 (08/28 0810) BP: (102-128)/(54-71) 111/58 (08/28 0810) SpO2:  [94 %-96 %] 95 % (08/28 0810)  Intake/Output from previous day:  Intake/Output Summary (Last 24 hours) at 12/13/2020 0956 Last data filed at 12/12/2020 1901 Gross per 24 hour  Intake 1514.79 ml  Output --  Net 1514.79 ml    Intake/Output this shift: No intake/output data recorded.  Labs: Recent Labs    12/11/20 0821 12/12/20 0459 12/13/20 0645  HGB 10.4* 8.3* 8.2*   Recent Labs    12/12/20 0459 12/13/20 0645  WBC 11.4* 11.9*  RBC 2.60* 2.60*  HCT 24.9* 24.6*  PLT 310 318   Recent Labs    12/12/20 0459 12/13/20 0645  NA 137 139  K 3.3* 3.4*  CL 104 104  CO2 27 27  BUN 10 10  CREATININE 0.35* 0.41*  GLUCOSE 135* 126*  CALCIUM 8.1* 8.3*   Recent Labs    12/11/20 0821  INR 1.0     EXAM General - Patient is  somnolent, but when roused A&O to person and place Extremity - Neurovascular intact Dorsiflexion/Plantar flexion intact Compartment soft; able to flex and extend fingers of right hand, N/V intact to right hand Dressing/Incision -mild serosanguinous drainage noted Motor Function - intact, moving foot and toes well on exam.    Assessment/Plan: 2 Days Post-Op Procedure(s) (LRB): ARTHROPLASTY BIPOLAR HIP (HEMIARTHROPLASTY) (Right) Principal Problem:   Closed right hip fracture (HCC) Active  Problems:   Hyperlipidemia LDL goal <100   MDD (major depressive disorder), recurrent episode, severe (HCC)   Essential hypertension, benign   Dementia, vascular, mixed, with behavioral disturbance (HCC)   CAP (community acquired pneumonia)   Stroke (HCC)   Humerus fracture-right  Estimated body mass index is 23.74 kg/m as calculated from the following:   Height as of this encounter: 5\' 3"  (1.6 m).   Weight as of this encounter: 60.8 kg. Advance diet Up with therapy  Discussed difficulty of d/c'ing home due to patient's RUE fracture and how this limits her ability to bear weight thru her arms when using the walker. Will check latest xrays of R humerus in office, but explained that typically motion begins at around 4 weeks with pendulum exercises, and progresses to PROM at 6 weeks with therapy. She would not be able to bear significant weight thru the RUE for some time.    Maintain posterior hip precautions, non WB to RUE.  Adjusted sling, replaced waist belt to help maintain proper position.      DVT Prophylaxis - Lovenox, Ted hose, and SCDs Weight-Bearing as tolerated to right leg, nonWB to RUE   , PA-C [facility] Orthopaedic Surgery 12/13/2020, 9:56 AM

## 2020-12-14 ENCOUNTER — Encounter: Payer: Self-pay | Admitting: Surgery

## 2020-12-14 LAB — CBC
HCT: 22.8 % — ABNORMAL LOW (ref 36.0–46.0)
Hemoglobin: 7.3 g/dL — ABNORMAL LOW (ref 12.0–15.0)
MCH: 30.8 pg (ref 26.0–34.0)
MCHC: 32 g/dL (ref 30.0–36.0)
MCV: 96.2 fL (ref 80.0–100.0)
Platelets: 281 10*3/uL (ref 150–400)
RBC: 2.37 MIL/uL — ABNORMAL LOW (ref 3.87–5.11)
RDW: 14.4 % (ref 11.5–15.5)
WBC: 8.7 10*3/uL (ref 4.0–10.5)
nRBC: 0 % (ref 0.0–0.2)

## 2020-12-14 LAB — BASIC METABOLIC PANEL
Anion gap: 5 (ref 5–15)
BUN: 12 mg/dL (ref 8–23)
CO2: 27 mmol/L (ref 22–32)
Calcium: 7.9 mg/dL — ABNORMAL LOW (ref 8.9–10.3)
Chloride: 106 mmol/L (ref 98–111)
Creatinine, Ser: 0.35 mg/dL — ABNORMAL LOW (ref 0.44–1.00)
GFR, Estimated: >60 mL/min (ref >60)
Glucose, Bld: 111 mg/dL — ABNORMAL HIGH (ref 70–99)
Potassium: 3.1 mmol/L — ABNORMAL LOW (ref 3.5–5.1)
Sodium: 138 mmol/L (ref 135–145)

## 2020-12-14 LAB — HEMOGLOBIN: Hemoglobin: 7.4 g/dL — ABNORMAL LOW (ref 12.0–15.0)

## 2020-12-14 LAB — STREP PNEUMONIAE URINARY ANTIGEN: Strep Pneumo Urinary Antigen: NEGATIVE

## 2020-12-14 MED ORDER — POTASSIUM CHLORIDE 20 MEQ PO PACK
40.0000 meq | PACK | Freq: Once | ORAL | Status: AC
  Administered 2020-12-14: 40 meq via ORAL
  Filled 2020-12-14: qty 2

## 2020-12-14 NOTE — Progress Notes (Signed)
  Subjective: 3 Days Post-Op Procedure(s) (LRB): ARTHROPLASTY BIPOLAR HIP (HEMIARTHROPLASTY) (Right) Patient reports pain in the right hip and shoulder.  Husband states that she had a rough night last night. Patient is well, and has had no acute complaints or problems Plan will be for discharge to SNF when appropriate. Negative for chest pain and shortness of breath Fever: no Gastrointestinal: negative for nausea and vomiting.  Abdomen soft with intact bowel sounds.  Objective: Vital signs in last 24 hours: Temp:  [97.6 F (36.4 C)-98.4 F (36.9 C)] 97.6 F (36.4 C) (08/29 0440) Pulse Rate:  [65-93] 65 (08/29 0440) Resp:  [15-18] 18 (08/29 0440) BP: (95-140)/(46-63) 95/46 (08/29 0440) SpO2:  [95 %-98 %] 98 % (08/29 0440)  Intake/Output from previous day:  Intake/Output Summary (Last 24 hours) at 12/14/2020 0805 Last data filed at 12/14/2020 0600 Gross per 24 hour  Intake 1433.7 ml  Output 1400 ml  Net 33.7 ml    Intake/Output this shift: No intake/output data recorded.  Labs: Recent Labs    12/11/20 0821 12/12/20 0459 12/13/20 0645 12/14/20 0422  HGB 10.4* 8.3* 8.2* 7.3*   Recent Labs    12/13/20 0645 12/14/20 0422  WBC 11.9* 8.7  RBC 2.60* 2.37*  HCT 24.6* 22.8*  PLT 318 281   Recent Labs    12/13/20 0645 12/14/20 0422  NA 139 138  K 3.4* 3.1*  CL 104 106  CO2 27 27  BUN 10 12  CREATININE 0.41* 0.35*  GLUCOSE 126* 111*  CALCIUM 8.3* 7.9*   Recent Labs    12/11/20 0821  INR 1.0     EXAM General - Patient is  somnolent, but when roused A&O to person and place Extremity - ABD soft Neurovascular intact Dorsiflexion/Plantar flexion intact Compartment soft; able to flex and extend fingers of right hand, N/V intact to right hand Patient is able to gently internal rotation to neutral position with external rotation. Able to gentle flex and extend at the right elbow. Dressing/Incision -mild serosanguinous drainage noted Motor Function - intact,  moving foot and toes well on exam.    Assessment/Plan: 3 Days Post-Op Procedure(s) (LRB): ARTHROPLASTY BIPOLAR HIP (HEMIARTHROPLASTY) (Right) Principal Problem:   Closed right hip fracture (HCC) Active Problems:   Hyperlipidemia LDL goal <100   MDD (major depressive disorder), recurrent episode, severe (HCC)   Essential hypertension, benign   Dementia, vascular, mixed, with behavioral disturbance (HCC)   CAP (community acquired pneumonia)   Stroke (HCC)   Humerus fracture-right  Estimated body mass index is 23.74 kg/m as calculated from the following:   Height as of this encounter: 5\' 3"  (1.6 m).   Weight as of this encounter: 60.8 kg. Advance diet Up with therapy  Labs reviewed yesterday, Hg 7.3, denies any dizziness this AM.  Will re-check Hemoglobin this afternoon. K+ 3.1, will supplement today. Plan will be for discharge to rehab at this time, will begin insurance auth today. Remain in sling at this time, non-weightbearing to the right arm.  Patient is 3 weeks s/p right proximal humerus fracture.    DVT Prophylaxis - Lovenox, Ted hose, and SCDs Weight-Bearing as tolerated to right leg, nonWB to RUE   , PA-C [facility] Orthopaedic Surgery 12/14/2020, 8:05 AM

## 2020-12-14 NOTE — Progress Notes (Signed)
Physical Therapy Treatment Patient Details Name: Crystal Newton MRN: [id number] DOB: 08/14/1945 Today's Date: 12/14/2020    History of Present Illness 75 yo female with recent fall at home resulted in RUE humeral fracture, now with another fall at home and R displaced femoral neck fracture.  Pt received R hemiarthroplasty, NWB on RUE and WBAT RLE with posterior precautions.  PMHx:  HTN, HLD, DM, stroke, TIA, GERD, depression, anxiety, migraine, dementia, kidney stone, PAD, falls    PT Comments    Pt lethargic upon arrival stating she does not feel well.  Repositioned in bed and pt agrees to supine ex to improve comfort RLE.  While doing AA/PROM pt drifts off to sleep.  Session stopped and pt allowed to rest.   Follow Up Recommendations  SNF     Equipment Recommendations  None recommended by PT    Recommendations for Other Services       Precautions / Restrictions Precautions Precautions: Fall;Posterior Hip Precaution Booklet Issued: No Precaution Comments: verbally reviewed Required Braces or Orthoses: Other Brace;Sling Other Brace: using pillow to maintain precautions Restrictions Weight Bearing Restrictions: Yes RUE Weight Bearing: Non weight bearing RLE Weight Bearing: Weight bearing as tolerated Other Position/Activity Restrictions: sling on RUE    Mobility  Bed Mobility               General bed mobility comments: deferred    Transfers                    Ambulation/Gait                 Stairs             Wheelchair Mobility    Modified Rankin (Stroke Patients Only)       Balance                                            Cognition Arousal/Alertness: Lethargic Behavior During Therapy: WFL for tasks assessed/performed Overall Cognitive Status: History of cognitive impairments - at baseline                                        Exercises Other Exercises Other Exercises: RLE AA/PROM     General Comments        Pertinent Vitals/Pain Pain Assessment: Faces Faces Pain Scale: Hurts little more Pain Location: RUE Pain Descriptors / Indicators: Grimacing;Guarding Pain Intervention(s): Limited activity within patient's tolerance;Monitored during session;Repositioned    Home Living                      Prior Function            PT Goals (current goals can now be found in the care plan section) Progress towards PT goals: Progressing toward goals    Frequency    7X/week      PT Plan Current plan remains appropriate    Co-evaluation              AM-PAC PT "6 Clicks" Mobility   Outcome Measure  Help needed turning from your back to your side while in a flat bed without using bedrails?: A Lot Help needed moving from lying on your back to sitting on the side of a flat bed without using  bedrails?: A Lot Help needed moving to and from a bed to a chair (including a wheelchair)?: A Lot Help needed standing up from a chair using your arms (e.g., wheelchair or bedside chair)?: A Lot Help needed to walk in hospital room?: A Lot Help needed climbing 3-5 steps with a railing? : Total 6 Click Score: 11    End of Session   Activity Tolerance: Patient limited by fatigue;Patient limited by lethargy Patient left: in bed;with call bell/phone within reach;with bed alarm set;with family/visitor present Nurse Communication: Mobility status PT Visit Diagnosis: Muscle weakness (generalized) (M62.81);Pain Pain - Right/Left: Right Pain - part of body: Arm;Hip     Time: 8003-4917 PT Time Calculation (min) (ACUTE ONLY): 10 min  Charges:  $Therapeutic Exercise: 8-22 mins                    Danielle Dess, PTA 12/14/20, 12:10 PM 12/14/2020, 12:09 PM

## 2020-12-14 NOTE — Care Management Important Message (Signed)
Important Message  Patient Details  Name: Crystal Newton MRN: [id number] Date of Birth: [birth year]   Medicare Important Message Given:  Yes     Johnell Comings 12/14/2020, 11:51 AM

## 2020-12-14 NOTE — TOC Progression Note (Signed)
Transition of Care [facility]) - Progression Note    Patient Details  Name: Crystal Newton MRN: [id number] Date of Birth: [birth year]  Transition of Care [facility]) CM/SW Contact  Barrie Dunker, RN Phone Number: 12/14/2020, 10:52 AM  Clinical Narrative:     Went in the room to review bed choices, the patient is not completely oriented, her husband will be returning to the hospital soon, CM will review the bed options when he gets back into the room with the Husband and the patient together       Expected Discharge Plan and Services                                                 Social Determinants of Health (SDOH) Interventions    Readmission Risk Interventions No flowsheet data found.

## 2020-12-14 NOTE — Progress Notes (Signed)
PROGRESS NOTE    Crystal Newton  RCV:[id number] DOB: 06/02/1945 DOA: 12/11/2020 PCP: Alm Bustard, NP   Assessment & Plan:   Principal Problem:   Closed right hip fracture (HCC) Active Problems:   Hyperlipidemia LDL goal <100   MDD (major depressive disorder), recurrent episode, severe (HCC)   Essential hypertension, benign   Dementia, vascular, mixed, with behavioral disturbance (HCC)   CAP (community acquired pneumonia)   Stroke (HCC)   Humerus fracture-right   Closed right hip fracture: s/p right hip unipolar hemiarthroplasty. Percocet, morphine prn. PT/OT recs SNF. CM is working on SNF placement    HLD: continue on staitn    Severe major depressive disorder: continue on home dose of vortioxetine, risperdal, depakote  HTN: continue on home dose of propranolol, lisinopril    Dementia: likely vascular. Continue on home dose of namenda, donepezil    CAP: continue on IV rocephin, azithromycin, bronchodilators. Encourage incentive spirometry. Legionella, strep are ordered but not collect yet   Leukocytosis: resolved   Normocytic anemia: w/ component of acute blood loss anemia secondary to above recent surgery. H&H are labile   Hypokalemia: KCl repleated. Will check Mg tomorrow AM  CVA: continue on aspirin, statin    Recent right humerus fracture: continue w/ sling   DM2: well controlled. Diet controlled     DVT prophylaxis: lovenox  Code Status: DNR Family Communication:  Disposition Plan: d/c to SNF  Level of care: Med-Surg  Status is: Inpatient  Remains inpatient appropriate because:Unsafe d/c plan, IV treatments appropriate due to intensity of illness or inability to take PO, and Inpatient level of care appropriate due to severity of illness, will likely be able to d/c to SNF in 24 hours  Dispo: The patient is from: Home              Anticipated d/c is to: SNF              Patient currently is: medically unstable for d/c    Difficult to place patient :  unclear    Consultants:  Ortho surg   Procedures:   Antimicrobials:    Subjective: Pt c/o leg pain   Objective: Vitals:   12/13/20 0810 12/13/20 1524 12/13/20 2158 12/14/20 0440  BP: (!) 111/58 (!) 120/55 140/63 (!) 95/46  Pulse: 71 84 93 65  Resp: 16 15  18   Temp: 98.4 F (36.9 C) 98.4 F (36.9 C) 98 F (36.7 C) 97.6 F (36.4 C)  TempSrc:      SpO2: 95% 97% 97% 98%  Weight:      Height:        Intake/Output Summary (Last 24 hours) at 12/14/2020 0738 Last data filed at 12/14/2020 0600 Gross per 24 hour  Intake 1433.7 ml  Output 1400 ml  Net 33.7 ml   Filed Weights   12/11/20 0817  Weight: 60.8 kg    Examination:  General exam: Appears calm but uncomfortable  Respiratory system: decreased breath sounds b/l  Cardiovascular system: S1 & S2+. No rubs or clicks Gastrointestinal system: Abd is soft, NT, ND & hypoactive bowel sounds  Central nervous system: Alert and awake. Moves all extremities  Psychiatry: Judgement and insight appear abnormal. Flat mood and affect    Data Reviewed: I have personally reviewed following labs and imaging studies  CBC: Recent Labs  Lab 12/11/20 0821 12/12/20 0459 12/13/20 0645 12/14/20 0422  WBC 13.7* 11.4* 11.9* 8.7  NEUTROABS 12.3*  --   --   --  HGB 10.4* 8.3* 8.2* 7.3*  HCT 31.9* 24.9* 24.6* 22.8*  MCV 95.5 95.8 94.6 96.2  PLT 405* 310 318 281   Basic Metabolic Panel: Recent Labs  Lab 12/11/20 0821 12/12/20 0459 12/13/20 0645 12/14/20 0422  NA 140 137 139 138  K 3.8 3.3* 3.4* 3.1*  CL 101 104 104 106  CO2 31 27 27 27   GLUCOSE 113* 135* 126* 111*  BUN 15 10 10 12   CREATININE 0.59 0.35* 0.41* 0.35*  CALCIUM 8.8* 8.1* 8.3* 7.9*   GFR: Estimated Creatinine Clearance: 50.3 mL/min (A) (by C-G formula based on SCr of 0.35 mg/dL (L)). Liver Function Tests: No results for input(s): AST, ALT, ALKPHOS, BILITOT, PROT, ALBUMIN in the last 168 hours. No results for input(s): LIPASE, AMYLASE in the last 168  hours. No results for input(s): AMMONIA in the last 168 hours. Coagulation Profile: Recent Labs  Lab 12/11/20 0821  INR 1.0   Cardiac Enzymes: Recent Labs  Lab 12/11/20 0821  CKTOTAL 76   BNP (last 3 results) No results for input(s): PROBNP in the last 8760 hours. HbA1C: No results for input(s): HGBA1C in the last 72 hours. CBG: Recent Labs  Lab 12/11/20 1717  GLUCAP 125*   Lipid Profile: No results for input(s): CHOL, HDL, LDLCALC, TRIG, CHOLHDL, LDLDIRECT in the last 72 hours. Thyroid Function Tests: No results for input(s): TSH, T4TOTAL, FREET4, T3FREE, THYROIDAB in the last 72 hours. Anemia Panel: No results for input(s): VITAMINB12, FOLATE, FERRITIN, TIBC, IRON, RETICCTPCT in the last 72 hours. Sepsis Labs: Recent Labs  Lab 12/11/20 12/13/20  LATICACIDVEN 0.8    Recent Results (from the past 240 hour(s))  Resp Panel by RT-PCR (Flu A&B, Covid) Nasopharyngeal Swab     Status: None   Collection Time: 12/11/20  9:52 AM   Specimen: Nasopharyngeal Swab; Nasopharyngeal(NP) swabs in vial transport medium  Result Value Ref Range Status   SARS Coronavirus 2 by RT PCR NEGATIVE NEGATIVE Final    Comment: (NOTE) SARS-CoV-2 target nucleic acids are NOT DETECTED.  The SARS-CoV-2 RNA is generally detectable in upper respiratory specimens during the acute phase of infection. The lowest concentration of SARS-CoV-2 viral copies this assay can detect is 138 copies/mL. A negative result does not preclude SARS-Cov-2 infection and should not be used as the sole basis for treatment or other patient management decisions. A negative result may occur with  improper specimen collection/handling, submission of specimen other than nasopharyngeal swab, presence of viral mutation(s) within the areas targeted by this assay, and inadequate number of viral copies(<138 copies/mL). A negative result must be combined with clinical observations, patient history, and epidemiological information.  The expected result is Negative.  Fact Sheet for Patients:  2956  Fact Sheet for Healthcare Providers:  12/13/20  This test is no t yet approved or cleared by the [employer] FDA and  has been authorized for detection and/or diagnosis of SARS-CoV-2 by FDA under an Emergency Use Authorization (EUA). This EUA will remain  in effect (meaning this test can be used) for the duration of the COVID-19 declaration under Section 564(b)(1) of the Act, 21 U.S.C.section 360bbb-3(b)(1), unless the authorization is terminated  or revoked sooner.       Influenza A by PCR NEGATIVE NEGATIVE Final   Influenza B by PCR NEGATIVE NEGATIVE Final    Comment: (NOTE) The Xpert Xpress SARS-CoV-2/FLU/RSV plus assay is intended as an aid in the diagnosis of influenza from Nasopharyngeal swab specimens and should not be used as a sole basis for  treatment. Nasal washings and aspirates are unacceptable for Xpert Xpress SARS-CoV-2/FLU/RSV testing.  Fact Sheet for Patients: [employer]  Fact Sheet for Healthcare Providers: [employer]it  This test is not yet approved or cleared by the [place] FDA and has been authorized for detection and/or diagnosis of SARS-CoV-2 by FDA under an Emergency Use Authorization (EUA). This EUA will remain in effect (meaning this test can be used) for the duration of the COVID-19 declaration under Section 564(b)(1) of the Act, 21 U.S.C. section 360bbb-3(b)(1), unless the authorization is terminated or revoked.  Performed at [facility], [address] [place], [place] [address]   Blood culture (single)     Status: None (Preliminary result)   Collection Time: 12/11/20  9:52 AM   Specimen: BLOOD  Result Value Ref Range Status   Specimen Description BLOOD LEFT ANTECUBITAL  Final   Special Requests   Final    BOTTLES DRAWN AEROBIC  AND ANAEROBIC Blood Culture results may not be optimal due to an inadequate volume of blood received in culture bottles   Culture   Final    NO GROWTH 2 DAYS Performed at [facility], [address] [place], [place] [address]    Report Status PENDING  Incomplete         Radiology Studies: DG Shoulder Right Port  Result Date: 12/13/2020 CLINICAL DATA:  Proximal humerus fracture. EXAM: PORTABLE RIGHT SHOULDER COMPARISON:  None. FINDINGS: Displaced/comminuted fracture of the RIGHT humeral head/neck, with over riding of the main fracture fragments. Humeral head is subluxed/dislocated inferiorly relative to the glenoid fossa. No scapular fracture identified. Acromioclavicular joint space appears normally aligned. IMPRESSION: 1. Displaced/comminuted fracture of the RIGHT humeral head/neck, with over riding of the main fracture fragments. 2. Humeral head appears subluxed/dislocated inferiorly relative to the glenoid fossa. Electronically Signed   By: Bary Richard M.D.   On: 12/13/2020 16:32        Scheduled Meds:  aspirin EC  81 mg Oral Daily   atorvastatin  80 mg Oral Daily   calcium carbonate  1 tablet Oral Q breakfast   cholecalciferol  1,000 Units Oral Daily   divalproex  125 mg Oral TID   docusate sodium  100 mg Oral BID   donepezil  10 mg Oral QHS   enoxaparin (LOVENOX) injection  40 mg Subcutaneous Q24H   feeding supplement  237 mL Oral BID BM   lisinopril  5 mg Oral Daily   melatonin  30 mg Oral QHS   memantine  2.5 mg Oral BID   multivitamin with minerals  1 tablet Oral Daily   multivitamin with minerals  1 tablet Oral Daily   pantoprazole  40 mg Oral Daily   potassium chloride  40 mEq Oral Once   propranolol  40 mg Oral BID   risperiDONE  0.5 mg Oral BID   And   risperiDONE  1 mg Oral Daily   vitamin B-12  1,000 mcg Oral Daily   vortioxetine HBr  20 mg Oral Q supper   Continuous Infusions:  sodium chloride 75 mL/hr at 12/14/20 0727   azithromycin  (ZITHROMAX) 500 MG IVPB (Vial-Mate [employer]) 500 mg (12/13/20 1024)   cefTRIAXone (ROCEPHIN)  IV 1 g (12/13/20 1225)     LOS: 3 days    Time spent: 33 mins     Charise Killian, MD [facility] Pager [phone]  If 7PM-7AM, please contact night-coverage 12/14/2020, 7:38 AM

## 2020-12-14 NOTE — TOC Progression Note (Signed)
Transition of Care [facility]) - Progression Note    Patient Details  Name: Crystal Newton MRN: [id number] Date of Birth: December 14, 1945  Transition of Care [facility]) CM/SW [place], RN Phone Number: 12/14/2020, 4:19 PM  Clinical Narrative:    The patient's husband came back and is in the patient's room, I met with him and the patient and reviewed the bed offers in detail including the [facility] rating, I explained the 2 days at no cost and the copay of $188 after the 20 days up to day 100, I wrote down the names of the facilities for her husband, he will discuss and review them with their adult children and will give me their choice in the morning        Expected Discharge Plan and Services                                                 Social Determinants of Health (SDOH) Interventions    Readmission Risk Interventions No flowsheet data found.

## 2020-12-15 DIAGNOSIS — R5381 Other malaise: Secondary | ICD-10-CM | POA: Diagnosis not present

## 2020-12-15 DIAGNOSIS — I10 Essential (primary) hypertension: Secondary | ICD-10-CM | POA: Diagnosis not present

## 2020-12-15 DIAGNOSIS — Z96641 Presence of right artificial hip joint: Secondary | ICD-10-CM | POA: Diagnosis not present

## 2020-12-15 DIAGNOSIS — E785 Hyperlipidemia, unspecified: Secondary | ICD-10-CM | POA: Diagnosis not present

## 2020-12-15 DIAGNOSIS — S72001D Fracture of unspecified part of neck of right femur, subsequent encounter for closed fracture with routine healing: Secondary | ICD-10-CM | POA: Diagnosis not present

## 2020-12-15 DIAGNOSIS — H9319 Tinnitus, unspecified ear: Secondary | ICD-10-CM | POA: Diagnosis not present

## 2020-12-15 DIAGNOSIS — S42291A Other displaced fracture of upper end of right humerus, initial encounter for closed fracture: Secondary | ICD-10-CM | POA: Diagnosis not present

## 2020-12-15 DIAGNOSIS — E876 Hypokalemia: Secondary | ICD-10-CM | POA: Diagnosis not present

## 2020-12-15 DIAGNOSIS — R279 Unspecified lack of coordination: Secondary | ICD-10-CM | POA: Diagnosis not present

## 2020-12-15 DIAGNOSIS — F0151 Vascular dementia with behavioral disturbance: Secondary | ICD-10-CM | POA: Diagnosis not present

## 2020-12-15 DIAGNOSIS — J189 Pneumonia, unspecified organism: Secondary | ICD-10-CM | POA: Diagnosis not present

## 2020-12-15 DIAGNOSIS — S42309D Unspecified fracture of shaft of humerus, unspecified arm, subsequent encounter for fracture with routine healing: Secondary | ICD-10-CM | POA: Diagnosis not present

## 2020-12-15 DIAGNOSIS — S72001A Fracture of unspecified part of neck of right femur, initial encounter for closed fracture: Secondary | ICD-10-CM | POA: Diagnosis not present

## 2020-12-15 DIAGNOSIS — M84459D Pathological fracture, hip, unspecified, subsequent encounter for fracture with routine healing: Secondary | ICD-10-CM | POA: Diagnosis not present

## 2020-12-15 DIAGNOSIS — Z4789 Encounter for other orthopedic aftercare: Secondary | ICD-10-CM | POA: Diagnosis not present

## 2020-12-15 DIAGNOSIS — K5900 Constipation, unspecified: Secondary | ICD-10-CM | POA: Diagnosis not present

## 2020-12-15 DIAGNOSIS — Z741 Need for assistance with personal care: Secondary | ICD-10-CM | POA: Diagnosis not present

## 2020-12-15 DIAGNOSIS — S7291XD Unspecified fracture of right femur, subsequent encounter for closed fracture with routine healing: Secondary | ICD-10-CM | POA: Diagnosis not present

## 2020-12-15 DIAGNOSIS — E119 Type 2 diabetes mellitus without complications: Secondary | ICD-10-CM | POA: Diagnosis not present

## 2020-12-15 DIAGNOSIS — E612 Magnesium deficiency: Secondary | ICD-10-CM | POA: Diagnosis not present

## 2020-12-15 DIAGNOSIS — M6281 Muscle weakness (generalized): Secondary | ICD-10-CM | POA: Diagnosis not present

## 2020-12-15 DIAGNOSIS — S42301D Unspecified fracture of shaft of humerus, right arm, subsequent encounter for fracture with routine healing: Secondary | ICD-10-CM | POA: Diagnosis not present

## 2020-12-15 LAB — CBC
HCT: 23.1 % — ABNORMAL LOW (ref 36.0–46.0)
Hemoglobin: 7.3 g/dL — ABNORMAL LOW (ref 12.0–15.0)
MCH: 29.8 pg (ref 26.0–34.0)
MCHC: 31.6 g/dL (ref 30.0–36.0)
MCV: 94.3 fL (ref 80.0–100.0)
Platelets: 318 10*3/uL (ref 150–400)
RBC: 2.45 MIL/uL — ABNORMAL LOW (ref 3.87–5.11)
RDW: 14.3 % (ref 11.5–15.5)
WBC: 9.2 10*3/uL (ref 4.0–10.5)
nRBC: 0 % (ref 0.0–0.2)

## 2020-12-15 LAB — LEGIONELLA PNEUMOPHILA SEROGP 1 UR AG: L. pneumophila Serogp 1 Ur Ag: NEGATIVE

## 2020-12-15 LAB — BASIC METABOLIC PANEL
Anion gap: 7 (ref 5–15)
BUN: 12 mg/dL (ref 8–23)
CO2: 25 mmol/L (ref 22–32)
Calcium: 7.8 mg/dL — ABNORMAL LOW (ref 8.9–10.3)
Chloride: 108 mmol/L (ref 98–111)
Creatinine, Ser: 0.35 mg/dL — ABNORMAL LOW (ref 0.44–1.00)
GFR, Estimated: >60 mL/min (ref >60)
Glucose, Bld: 99 mg/dL (ref 70–99)
Potassium: 3.2 mmol/L — ABNORMAL LOW (ref 3.5–5.1)
Sodium: 140 mmol/L (ref 135–145)

## 2020-12-15 LAB — RESP PANEL BY RT-PCR (FLU A&B, COVID) ARPGX2
Influenza A by PCR: NEGATIVE
Influenza B by PCR: NEGATIVE
SARS Coronavirus 2 by RT PCR: NEGATIVE

## 2020-12-15 LAB — MAGNESIUM: Magnesium: 1.6 mg/dL — ABNORMAL LOW (ref 1.7–2.4)

## 2020-12-15 LAB — SURGICAL PATHOLOGY

## 2020-12-15 MED ORDER — DOCUSATE SODIUM 100 MG PO CAPS: 100.0000 mg | ORAL_CAPSULE | Freq: Two times a day (BID) | ORAL | Status: DC | PRN

## 2020-12-15 MED ORDER — ENOXAPARIN SODIUM 40 MG/0.4ML IJ SOSY: 40.0000 mg | PREFILLED_SYRINGE | INTRAMUSCULAR | 0 refills | Status: DC

## 2020-12-15 MED ORDER — ENOXAPARIN SODIUM 40 MG/0.4ML IJ SOSY
40.0000 mg | PREFILLED_SYRINGE | INTRAMUSCULAR | Status: DC
Start: 2020-12-16 — End: 2020-12-15

## 2020-12-15 MED ORDER — OXYCODONE-ACETAMINOPHEN 5-325 MG PO TABS: 0.5000 | ORAL_TABLET | Freq: Three times a day (TID) | ORAL | 0 refills | Status: DC | PRN

## 2020-12-15 MED ORDER — OXYCODONE-ACETAMINOPHEN 5-325 MG PO TABS: 1.0000 | ORAL_TABLET | Freq: Four times a day (QID) | ORAL | 0 refills | Status: DC | PRN

## 2020-12-15 MED ORDER — POTASSIUM CHLORIDE CRYS ER 20 MEQ PO TBCR
40.0000 meq | EXTENDED_RELEASE_TABLET | Freq: Once | ORAL | Status: AC
  Administered 2020-12-15: 40 meq via ORAL
  Filled 2020-12-15: qty 2

## 2020-12-15 MED ORDER — MAGNESIUM SULFATE 2 GM/50ML IV SOLN
2.0000 g | Freq: Once | INTRAVENOUS | Status: AC
  Administered 2020-12-15: 2 g via INTRAVENOUS
  Filled 2020-12-15: qty 50

## 2020-12-15 NOTE — TOC Progression Note (Signed)
Transition of Care [facility]) - Progression Note    Patient Details  Name: Crystal Newton MRN: [id number] Date of Birth: 1945/07/09  Transition of Care [facility]) CM/SW Contact  Barrie Dunker, RN Phone Number: 12/15/2020, 2:04 PM  Clinical Narrative:     [facility] to [facility] room [facility]09, They will transport at 530 PM, The bedside nurse to call report, The family is aware of the transport       Expected Discharge Plan and Services           Expected Discharge Date: 12/15/20                                     Social Determinants of Health (SDOH) Interventions    Readmission Risk Interventions No flowsheet data found.

## 2020-12-15 NOTE — Progress Notes (Signed)
Pt discharged to rehab facility.  IV removed without complication.  AVS, DNR, and prescriptions placed in packet.  All belongings at bedside taken with pt.  Pt transported by [facility].  Report called to Alen Bleacher, LPN

## 2020-12-15 NOTE — TOC Progression Note (Signed)
Transition of Care [facility]) - Progression Note    Patient Details  Name: Crystal Newton MRN: [id number] Date of Birth: 1945/07/25  Transition of Care [facility]) CM/SW Contact  Barrie Dunker, RN Phone Number: 12/15/2020, 9:54 AM  Clinical Narrative:     Spoke with the patient's family and husband at the bedside He requested information about visitation policy and if he could spend the night at the [facility] facilities with the patient as she gets confused I contacted [facility], they no longer have a bed and do not allow family to spend the night I called and left a VM for Ricky at [facility] and requested a call back, I called [facility] and left a VM and am awaiting a call back, I called [facility] and left a VM and am awaiting a call back,       Expected Discharge Plan and Services                                                 Social Determinants of Health (SDOH) Interventions    Readmission Risk Interventions No flowsheet data found.

## 2020-12-15 NOTE — Progress Notes (Signed)
Physical Therapy Treatment Patient Details Name: Crystal Newton MRN: [id number] DOB: [birth year] Today's Date: 12/15/2020    History of Present Illness 75 yo female with recent fall at home resulted in RUE humeral fracture, now with another fall at home and R displaced femoral neck fracture.  Pt received R hemiarthroplasty, NWB on RUE and WBAT RLE with posterior precautions.  PMHx:  HTN, HLD, DM, stroke, TIA, GERD, depression, anxiety, migraine, dementia, kidney stone, PAD, falls    PT Comments    Pt ready for session.  Remains sleepy but awakens as session progresses. Participated in exercises as described below.  To EOB with min a x 1 and cues for hand placement.  Steady in sitting.  Stands at EOB with +1 min assist.  Once standing, she does start to have BM.  Returned to sitting and commode is obtained.  She is able to transfer with min/mod a x 1 stand pivot.  Once completed, she is able to stand for about 3 minutes while tech in to provide care.  Transferred back to bed and took several side steps up along bed for proper positioning.  Mod a to return to supine.  Overall did very well in session today with good effort and mobility.  IV team in to restart IV so pt was left in bed in her care.  Pt encouraged to eat breakfast after it is inserted.   Follow Up Recommendations  SNF     Equipment Recommendations  None recommended by PT    Recommendations for Other Services       Precautions / Restrictions Precautions Precautions: Fall;Posterior Hip Precaution Booklet Issued: No Precaution Comments: verbally reviewed Required Braces or Orthoses: Other Brace;Sling Other Brace: using pillow to maintain precautions Restrictions Weight Bearing Restrictions: Yes RUE Weight Bearing: Non weight bearing RLE Weight Bearing: Weight bearing as tolerated Other Position/Activity Restrictions: sling on RUE    Mobility  Bed Mobility Overal bed mobility: Needs Assistance Bed Mobility: Supine to  Sit;Sit to Supine     Supine to sit: Min assist Sit to supine: Mod assist        Transfers Overall transfer level: Needs assistance Equipment used: 1 person hand held assist Transfers: Sit to/from Stand;Stand Pivot Transfers Sit to Stand: Min assist Stand pivot transfers: Min assist          Ambulation/Gait         Gait velocity: decreased   General Gait Details: no true gait but is able to step well to [facility] then sidestep along bed prior to sitting   Stairs             Wheelchair Mobility    Modified Rankin (Stroke Patients Only)       Balance Overall balance assessment: Needs assistance Sitting-balance support: Feet supported Sitting balance-Leahy Scale: Fair     Standing balance support: Single extremity supported;During functional activity Standing balance-Leahy Scale: Poor Standing balance comment: requires support on trunk and LUE to stand                            Cognition Arousal/Alertness: Lethargic Behavior During Therapy: WFL for tasks assessed/performed Overall Cognitive Status: Within Functional Limits for tasks assessed                                        Exercises Other Exercises Other Exercises: supine  and seated A/AAROM    General Comments        Pertinent Vitals/Pain Pain Assessment: Faces Faces Pain Scale: Hurts little more Pain Location: RUE, R LE with WB Pain Descriptors / Indicators: Grimacing;Guarding Pain Intervention(s): Limited activity within patient's tolerance;Monitored during session;Repositioned    Home Living                      Prior Function            PT Goals (current goals can now be found in the care plan section) Progress towards PT goals: Progressing toward goals    Frequency    7X/week      PT Plan Current plan remains appropriate    Co-evaluation              AM-PAC PT "6 Clicks" Mobility   Outcome Measure  Help needed turning  from your back to your side while in a flat bed without using bedrails?: A Lot Help needed moving from lying on your back to sitting on the side of a flat bed without using bedrails?: A Lot Help needed moving to and from a bed to a chair (including a wheelchair)?: A Little Help needed standing up from a chair using your arms (e.g., wheelchair or bedside chair)?: A Little Help needed to walk in hospital room?: A Lot Help needed climbing 3-5 steps with a railing? : Total 6 Click Score: 13    End of Session Equipment Utilized During Treatment: Gait belt Activity Tolerance: Patient tolerated treatment well;Patient limited by fatigue Patient left: in bed;with call bell/phone within reach;with bed alarm set;with family/visitor present Nurse Communication: Mobility status PT Visit Diagnosis: Muscle weakness (generalized) (M62.81);Pain Pain - Right/Left: Right Pain - part of body: Arm;Hip     Time: 3716-9678 PT Time Calculation (min) (ACUTE ONLY): 23 min  Charges:  $Therapeutic Exercise: 8-22 mins $Therapeutic Activity: 8-22 mins                     Danielle Dess, PTA 12/15/20, 9:23 AM , 9:20 AM

## 2020-12-15 NOTE — Progress Notes (Signed)
Subjective: 4 Days Post-Op Procedure(s) (LRB): ARTHROPLASTY BIPOLAR HIP (HEMIARTHROPLASTY) (Right) Patient reports pain in the right hip and shoulder, improved compared to yesterday. Overall patient feels that she is doing better today then yesterday. Had a productive session with PT this AM. Patient is well, and has had no acute complaints or problems Plan will be for discharge to SNF when appropriate. Negative for chest pain and shortness of breath Fever: no Gastrointestinal: negative for nausea and vomiting.  Abdomen soft with intact bowel sounds.  Objective: Vital signs in last 24 hours: Temp:  [97.7 F (36.5 C)-98.2 F (36.8 C)] 97.7 F (36.5 C) (08/30 0729) Pulse Rate:  [70-88] 75 (08/30 0729) Resp:  [16-18] 18 (08/30 0729) BP: (122-136)/(59-66) 136/66 (08/30 0729) SpO2:  [95 %-97 %] 96 % (08/30 0729)  Intake/Output from previous day:  Intake/Output Summary (Last 24 hours) at 12/15/2020 1307 Last data filed at 12/14/2020 1414 Gross per 24 hour  Intake 120 ml  Output --  Net 120 ml    Intake/Output this shift: No intake/output data recorded.  Labs: Recent Labs    12/13/20 0645 12/14/20 0422 12/14/20 1128 12/15/20 0504  HGB 8.2* 7.3* 7.4* 7.3*   Recent Labs    12/14/20 0422 12/15/20 0504  WBC 8.7 9.2  RBC 2.37* 2.45*  HCT 22.8* 23.1*  PLT 281 318   Recent Labs    12/14/20 0422 12/15/20 0504  NA 138 140  K 3.1* 3.2*  CL 106 108  CO2 27 25  BUN 12 12  CREATININE 0.35* 0.35*  GLUCOSE 111* 99  CALCIUM 7.9* 7.8*   No results for input(s): LABPT, INR in the last 72 hours.    EXAM General - Patient is more alert this AM.  Baseline confusion. Extremity - ABD soft Neurovascular intact Dorsiflexion/Plantar flexion intact Compartment soft; able to flex and extend fingers of right hand, N/V intact to right hand Patient is able to gently internal rotation to neutral position with external rotation. Able to gentle flex and extend at the right  elbow. Dressing/Incision -mild serosanguinous drainage noted Motor Function - intact, moving foot and toes well on exam.  Abdomen soft with intact bowel sounds.  Non-tender to palpation.   Assessment/Plan: 4 Days Post-Op Procedure(s) (LRB): ARTHROPLASTY BIPOLAR HIP (HEMIARTHROPLASTY) (Right) Principal Problem:   Closed right hip fracture (HCC) Active Problems:   Hyperlipidemia LDL goal <100   MDD (major depressive disorder), recurrent episode, severe (HCC)   Essential hypertension, benign   Dementia, vascular, mixed, with behavioral disturbance (HCC)   CAP (community acquired pneumonia)   Stroke (HCC)   Humerus fracture-right  Estimated body mass index is 23.74 kg/m as calculated from the following:   Height as of this encounter: 5\' 3"  (1.6 m).   Weight as of this encounter: 60.8 kg. Advance diet Up with therapy  Labs reviewed yesterday, Hg 7.3, denies any dizziness this AM.  Will re-check Hemoglobin this afternoon. K+ 3.2, will supplement today. Plan will be for discharge to rehab at this time Patient has had a BM. Remain in sling at this time, non-weightbearing to the right arm.  Patient is 3 weeks s/p right proximal humerus fracture. Patient should follow-up with [facility] in 10-14 days for staple removal and x-rays of the right shoulder and hip. Continue Lovenox 40mg  daily for 14 days.    DVT Prophylaxis - Lovenox, Ted hose, and SCDs Weight-Bearing as tolerated to right leg, nonWB to RUE   01-11-1979, PA-C [facility] 12/15/2020, 1:07 PM

## 2020-12-15 NOTE — TOC Progression Note (Signed)
Transition of Care (TOC) - Progression Note    Patient Details  Name: Crystal Newton MRN: [id number] Date of Birth: 04/21/1945  Transition of Care (TOC) CM/SW Contact   J , RN Phone Number: 12/15/2020, 12:16 PM  Clinical Narrative:     Met with the patient 's family, they chose the bed at [facility], she will DC today after the Covid test comes back       Expected Discharge Plan and Services                                                 Social Determinants of Health (SDOH) Interventions    Readmission Risk Interventions No flowsheet data found.  

## 2020-12-15 NOTE — Discharge Summary (Addendum)
Physician Discharge Summary  Crystal Newton VHQ:[id number] DOB: 18-Oct-1945 DOA: 12/11/2020  PCP: Alm Bustard, NP  Admit date: 12/11/2020 Discharge date: 12/15/2020  Admitted From: home  Disposition:  SNF  Recommendations for Outpatient Follow-up:  Follow up with PCP in 1-2 weeks F/u w/ ortho surg, Dr. Joice Lofts, in 1-2 weeks  Home Health: no  Equipment/Devices:  Discharge Condition: stable  CODE STATUS: DNR  Diet recommendation: Heart Healthy   Brief/Interim Summary: HPI was taken from Dr.Niu: Crystal Newton is a 75 y.o. female with medical history significant of hypertension, hyperlipidemia, diet-controlled diabetes, stroke, TIA, GERD, depression, anxiety, migraine headache, dementia, kidney stone, PAD, recent right humerus fracture on sling, who presents with fall and right hip pain.   Pt states that she accidentally fell when she was using bathroom in the early AM.  No loss of consciousness.  She states that she hit her head, but no headache or neck pain.  She developed pain in right hip, which is constant, sharp, severe, nonradiating.  Patient has cough which has been going on for almost a month, with little greenish colored sputum production.  No chest pain, fever or chills.  No nausea, vomiting, diarrhea or abdominal pain.  No symptoms of UTI.   Hospital course from Dr. Mayford Knife 8/27-8/30/22:  Pt presented after a fall at home and was found to have right hip fracture. Pt is s/p right hip unipolar hemiarthroplasty. Pt will continue on lovenox for DVT prophylaxis as per ortho surg. Pt is weight bearing as tolerated on RLE. PT/OT evaluated the pt and recommended SNF. Of note, pt was also found to have CAP and was treated w/ IV abxs and completed the course of abxs while inpatient. For more information, please see previous progress/consult notes.   Discharge Diagnoses:  Principal Problem:   Closed right hip fracture (HCC) Active Problems:   Hyperlipidemia LDL goal <100   MDD (major  depressive disorder), recurrent episode, severe (HCC)   Essential hypertension, benign   Dementia, vascular, mixed, with behavioral disturbance (HCC)   CAP (community acquired pneumonia)   Stroke (HCC)   Humerus fracture-right   Closed right hip fracture: s/p right hip unipolar hemiarthroplasty. Percocet prn. PT/OT recs SNF.    HLD: continue on staitn    Severe major depressive disorder: continue on home dose of vortioxetine, risperdal, depakote  HTN: continue on home dose of propranolol, lisinopril    Dementia: likely vascular. Continue on home dose of namenda, donepezil    CAP: completed abx course. Continue on bronchodilators. Encourage incentive spirometry. Strep is neg   Leukocytosis: resolved   Normocytic anemia: w/ component of acute blood loss anemia secondary to above recent surgery. H&H are labile   Hypokalemia: potassium given  Hypomagnesemia: mg sulfate given   CVA: continue on aspirin, statin    Recent right humerus fracture: continue w/ sling and non weight bearing to RUE as per ortho surg    DM2: well controlled. Diet controlled   Discharge Instructions  Discharge Instructions     Diet - low sodium heart healthy   Complete by: As directed    Discharge instructions   Complete by: As directed    F/u w/ PCP in 1-2 weeks. F/u w/ ortho surg, Dr. Joice Lofts, in 1-2 weeks   Increase activity slowly   Complete by: As directed    No wound care   Complete by: As directed       Allergies as of 12/15/2020   No Known Allergies  Medication List     STOP taking these medications    meloxicam 15 MG tablet Commonly known as: MOBIC   pantoprazole 40 MG tablet Commonly known as: PROTONIX   traMADol 50 MG tablet Commonly known as: ULTRAM       TAKE these medications    aspirin 81 MG EC tablet Take 81 mg by mouth daily.   atorvastatin 80 MG tablet Commonly known as: LIPITOR TAKE 1 TABLET BY MOUTH EVERY DAY   Calcium 600 1500 (600 Ca) MG Tabs  tablet Generic drug: calcium carbonate Take 1 tablet by mouth daily with breakfast.   CENTRUM SILVER PO Take 1 tablet by mouth daily.   cetirizine 10 MG tablet Commonly known as: ZYRTEC Take 10 mg by mouth at bedtime.   divalproex 125 MG DR tablet Commonly known as: DEPAKOTE Take 125 mg by mouth 3 (three) times daily.   donepezil 10 MG tablet Commonly known as: ARICEPT TAKE 1 TABLET BY MOUTH EVERY DAY EVERY NIGHT   enoxaparin 40 MG/0.4ML injection Commonly known as: LOVENOX Inject 0.4 mLs (40 mg total) into the skin daily. Start taking on: December 16, 2020   hyoscyamine 0.375 MG 12 hr tablet Commonly known as: LEVBID Take 0.375 mg by mouth every 12 (twelve) hours as needed for cramping.   lisinopril 5 MG tablet Commonly known as: ZESTRIL TAKE 1 TABLET BY MOUTH EVERY DAY   Melatonin 10 MG Tabs Take 30 mg by mouth at bedtime.   memantine 10 MG tablet Commonly known as: NAMENDA Take 0.5 tablets (5 mg total) by mouth 2 (two) times daily.   nitroGLYCERIN 0.3 MG SL tablet Commonly known as: Nitrostat Place 1 tablet (0.3 mg total) under the tongue every 5 (five) minutes as needed for chest pain (call your doctor if pain persists after 3 doses).   omeprazole 20 MG capsule Commonly known as: PRILOSEC Take 40 mg by mouth daily with supper.   oxyCODONE-acetaminophen 5-325 MG tablet Commonly known as: PERCOCET/ROXICET Take 0.5-1 tablets by mouth every 8 (eight) hours as needed for moderate pain or severe pain.   propranolol 40 MG tablet Commonly known as: INDERAL TAKE 1 TABLET BY MOUTH TWICE A DAY   risperiDONE 0.5 MG tablet Commonly known as: RISPERDAL Take 0.5-1 mg by mouth See admin instructions. Take 1 tablet (0.5mg ) by mouth every morning, take 2 tablets (1mg ) by mouth every afternoon and take 1 tablet (0.5mg ) by mouth at bedtime   vitamin B-12 1000 MCG tablet Commonly known as: CYANOCOBALAMIN Take 1,000 mcg by mouth daily.   Vitamin D3 25 MCG tablet Commonly  known as: Vitamin D Take 1,000 Units by mouth daily.   vortioxetine HBr 20 MG Tabs tablet Commonly known as: TRINTELLIX Take 20 mg by mouth daily with supper.        Contact information for after-discharge care     Destination     [facility] Preferred SNF .   Service: Skilled Nursing Contact information: [address] [place] [phone]                    No Known Allergies  Consultations: ortho surg    Procedures/Studies: CT HEAD WO CONTRAST ([phone])  Result Date: 12/11/2020 CLINICAL DATA:  Head trauma, minor (Age >= 65y) EXAM: CT HEAD WITHOUT CONTRAST TECHNIQUE: Contiguous axial images were obtained from the base of the skull through the vertex without intravenous contrast. COMPARISON:  11/24/2020 FINDINGS: Brain: No evidence of acute infarction, hemorrhage, hydrocephalus,  extra-axial collection or mass lesion/mass effect. Mild low-density changes within the periventricular and subcortical white matter compatible with chronic microvascular ischemic change. Mild diffuse cerebral volume loss. Vascular: Atherosclerotic calcifications involving the large vessels of the skull base. No unexpected hyperdense vessel. Skull: Normal. Negative for fracture or focal lesion. Sinuses/Orbits: No acute finding. Other: None. IMPRESSION: 1. No acute intracranial findings. 2. Chronic microvascular ischemic change and cerebral volume loss. Electronically Signed   By: Duanne GuessNicholas  Plundo D.O.   On: 12/11/2020 12:34   CT HEAD WO CONTRAST (5MM)  Result Date: 11/24/2020 CLINICAL DATA:  Fall at home, initial encounter W19.XXXA, Y92.009 (ICD-10-CM). Head trauma, initial encounter S09.90XA (ICD-10-CM). EXAM: CT HEAD WITHOUT CONTRAST TECHNIQUE: Contiguous axial images were obtained from the base of the skull through the vertex without intravenous contrast. COMPARISON:  Brain MRI 03/12/2019.  Head CT 07/17/2012. FINDINGS: Brain: Mild generalized cerebral  atrophy. Mild patchy and ill-defined hypoattenuation within the cerebral white matter, nonspecific but compatible with chronic small vessel ischemic disease. There is no acute intracranial hemorrhage. No demarcated cortical infarct. No extra-axial fluid collection. No evidence of an intracranial mass. No midline shift. Vascular: No hyperdense vessel.  Atherosclerotic calcifications. Skull: Normal. Negative for fracture or focal lesion. Sinuses/Orbits: Visualized orbits show no acute finding. No significant inflammatory paranasal sinus disease at the imaged levels. Small osteoma within the left sphenoid sinus. Other: Bilateral TMJ osteoarthrosis. IMPRESSION: No evidence of acute intracranial abnormality. Mild chronic small-vessel ischemic changes within the cerebral white matter. Mild generalized cerebral atrophy. Electronically Signed   By: Jackey LogeKyle  Golden DO   On: 11/24/2020 15:55   DG Chest Port 1 View  Result Date: 12/11/2020 CLINICAL DATA:  75 year old female with history of right-sided hip pain after a fall in the bathroom today. EXAM: PORTABLE CHEST 1 VIEW COMPARISON:  Chest x-ray 08/18/2020. FINDINGS: Several patchy opacities are noted in the right upper lobe, including a band like opacity immediately above the minor fissure which appears superiorly displaced. Left lung is clear. No pleural effusions. No pneumothorax. No evidence of pulmonary edema. Heart size is normal. Upper mediastinal contours are within normal limits allowing for patient rotation the left. Atherosclerotic calcifications in the thoracic aorta. IMPRESSION: 1. Patchy opacities in the right upper lobe which likely reflect a combination of airspace consolidation and atelectasis concerning for developing bronchopneumonia. Followup PA and lateral chest X-ray is recommended in 3-4 weeks following trial of antibiotic therapy to ensure resolution and exclude underlying malignancy. 2. Aortic atherosclerosis. Electronically Signed   By: Trudie Reedaniel   Entrikin M.D.   On: 12/11/2020 09:27   DG Shoulder Right Port  Result Date: 12/13/2020 CLINICAL DATA:  Proximal humerus fracture. EXAM: PORTABLE RIGHT SHOULDER COMPARISON:  None. FINDINGS: Displaced/comminuted fracture of the RIGHT humeral head/neck, with over riding of the main fracture fragments. Humeral head is subluxed/dislocated inferiorly relative to the glenoid fossa. No scapular fracture identified. Acromioclavicular joint space appears normally aligned. IMPRESSION: 1. Displaced/comminuted fracture of the RIGHT humeral head/neck, with over riding of the main fracture fragments. 2. Humeral head appears subluxed/dislocated inferiorly relative to the glenoid fossa. Electronically Signed   By: Bary RichardStan  Maynard M.D.   On: 12/13/2020 16:32   DG HIP UNILAT W OR W/O PELVIS 2-3 VIEWS RIGHT  Result Date: 12/11/2020 CLINICAL DATA:  Postop right hip surgery EXAM: DG HIP (WITH OR WITHOUT PELVIS) 2-3V RIGHT COMPARISON:  Preop radiographs dated 12/11/2020 FINDINGS: Right hip hemiarthroplasty in satisfactory position. Overlying skin staples. Visualized bony pelvis appears intact IMPRESSION: Right hip hemiarthroplasty in satisfactory position. Electronically Signed  By: Charline Bills M.D.   On: 12/11/2020 19:12   DG Hip Unilat  With Pelvis 2-3 Views Right  Result Date: 12/11/2020 CLINICAL DATA:  Right hip pain.  Fall. EXAM: DG HIP (WITH OR WITHOUT PELVIS) 2-3V RIGHT COMPARISON:  None. FINDINGS: Acute displaced fracture involving the proximal right femur. The fracture appears to be at the junction of the femoral head and neck and suggestive for a subcapital fracture. The right femoral neck is superiorly displaced. Right femoral head appears to be located. Pelvic bony ring is intact. Normal appearance of the left hip. IMPRESSION: Displaced right subcapital femur fracture. Electronically Signed   By: Richarda Overlie M.D.   On: 12/11/2020 09:26   (Echo, Carotid, EGD, Colonoscopy, ERCP)    Subjective: Pt c/o fatigue     Discharge Exam: Vitals:   12/15/20 0540 12/15/20 0729  BP: (!) 122/59 136/66  Pulse: 70 75  Resp: 16 18  Temp: 97.8 F (36.6 C) 97.7 F (36.5 C)  SpO2: 95% 96%   Vitals:   12/14/20 1616 12/14/20 2145 12/15/20 0540 12/15/20 0729  BP: (!) 127/59 136/61 (!) 122/59 136/66  Pulse: 83 88 70 75  Resp: 16 16 16 18   Temp: 98 F (36.7 C) 98.2 F (36.8 C) 97.8 F (36.6 C) 97.7 F (36.5 C)  TempSrc:      SpO2: 96% 97% 95% 96%  Weight:      Height:        General: Pt is alert, awake, not in acute distress Cardiovascular: S1/S2 +, no rubs, no gallops Respiratory: CTA bilaterally, no wheezing, no rhonchi Abdominal: Soft, NT, ND, bowel sounds + Extremities:  no cyanosis    The results of significant diagnostics from this hospitalization (including imaging, microbiology, ancillary and laboratory) are listed below for reference.     Microbiology: Recent Results (from the past 240 hour(s))  Resp Panel by RT-PCR (Flu A&B, Covid) Nasopharyngeal Swab     Status: None   Collection Time: 12/11/20  9:52 AM   Specimen: Nasopharyngeal Swab; Nasopharyngeal(NP) swabs in vial transport medium  Result Value Ref Range Status   SARS Coronavirus 2 by RT PCR NEGATIVE NEGATIVE Final    Comment: (NOTE) SARS-CoV-2 target nucleic acids are NOT DETECTED.  The SARS-CoV-2 RNA is generally detectable in upper respiratory specimens during the acute phase of infection. The lowest concentration of SARS-CoV-2 viral copies this assay can detect is 138 copies/mL. A negative result does not preclude SARS-Cov-2 infection and should not be used as the sole basis for treatment or other patient management decisions. A negative result may occur with  improper specimen collection/handling, submission of specimen other than nasopharyngeal swab, presence of viral mutation(s) within the areas targeted by this assay, and inadequate number of viral copies(<138 copies/mL). A negative result must be combined  with clinical observations, patient history, and epidemiological information. The expected result is Negative.  Fact Sheet for Patients:  12/13/20  Fact Sheet for Healthcare Providers:  BloggerCourse.com  This test is no t yet approved or cleared by the [place] FDA and  has been authorized for detection and/or diagnosis of SARS-CoV-2 by FDA under an Emergency Use Authorization (EUA). This EUA will remain  in effect (meaning this test can be used) for the duration of the COVID-19 declaration under Section 564(b)(1) of the Act, 21 U.S.C.section 360bbb-3(b)(1), unless the authorization is terminated  or revoked sooner.       Influenza A by PCR NEGATIVE NEGATIVE Final   Influenza B by PCR NEGATIVE  NEGATIVE Final    Comment: (NOTE) The Xpert Xpress SARS-CoV-2/FLU/RSV plus assay is intended as an aid in the diagnosis of influenza from Nasopharyngeal swab specimens and should not be used as a sole basis for treatment. Nasal washings and aspirates are unacceptable for Xpert Xpress SARS-CoV-2/FLU/RSV testing.  Fact Sheet for Patients: BloggerCourse.com  Fact Sheet for Healthcare Providers: SeriousBroker.it  This test is not yet approved or cleared by the [place] FDA and has been authorized for detection and/or diagnosis of SARS-CoV-2 by FDA under an Emergency Use Authorization (EUA). This EUA will remain in effect (meaning this test can be used) for the duration of the COVID-19 declaration under Section 564(b)(1) of the Act, 21 U.S.C. section 360bbb-3(b)(1), unless the authorization is terminated or revoked.  Performed at [facility], [address] [place], [place] [address]   Blood culture (single)     Status: None (Preliminary result)   Collection Time: 12/11/20  9:52 AM   Specimen: BLOOD  Result Value Ref Range Status   Specimen Description  BLOOD LEFT ANTECUBITAL  Final   Special Requests   Final    BOTTLES DRAWN AEROBIC AND ANAEROBIC Blood Culture results may not be optimal due to an inadequate volume of blood received in culture bottles   Culture   Final    NO GROWTH 4 DAYS Performed at [facility], [address] [place], [place] [address]    Report Status PENDING  Incomplete  Resp Panel by RT-PCR (Flu A&B, Covid) Nasopharyngeal Swab     Status: None   Collection Time: 12/15/20 12:28 PM   Specimen: Nasopharyngeal Swab; Nasopharyngeal(NP) swabs in vial transport medium  Result Value Ref Range Status   SARS Coronavirus 2 by RT PCR NEGATIVE NEGATIVE Final    Comment: (NOTE) SARS-CoV-2 target nucleic acids are NOT DETECTED.  The SARS-CoV-2 RNA is generally detectable in upper respiratory specimens during the acute phase of infection. The lowest concentration of SARS-CoV-2 viral copies this assay can detect is 138 copies/mL. A negative result does not preclude SARS-Cov-2 infection and should not be used as the sole basis for treatment or other patient management decisions. A negative result may occur with  improper specimen collection/handling, submission of specimen other than nasopharyngeal swab, presence of viral mutation(s) within the areas targeted by this assay, and inadequate number of viral copies(<138 copies/mL). A negative result must be combined with clinical observations, patient history, and epidemiological information. The expected result is Negative.  Fact Sheet for Patients:  BloggerCourse.com  Fact Sheet for Healthcare Providers:  [employer]iousBroker.it  This test is no t yet approved or cleared by the [place] FDA and  has been authorized for detection and/or diagnosis of SARS-CoV-2 by FDA under an Emergency Use Authorization (EUA). This EUA will remain  in effect (meaning this test can be used) for the duration of the COVID-19 declaration  under Section 564(b)(1) of the Act, 21 U.S.C.section 360bbb-3(b)(1), unless the authorization is terminated  or revoked sooner.       Influenza A by PCR NEGATIVE NEGATIVE Final   Influenza B by PCR NEGATIVE NEGATIVE Final    Comment: (NOTE) The Xpert Xpress SARS-CoV-2/FLU/RSV plus assay is intended as an aid in the diagnosis of influenza from Nasopharyngeal swab specimens and should not be used as a sole basis for treatment. Nasal washings and aspirates are unacceptable for Xpert Xpress SARS-CoV-2/FLU/RSV testing.  Fact Sheet for Patients: BloggerCourse.com  Fact Sheet for Healthcare Providers: SeriousBroker.it  This test is not yet approved or  cleared by the [place] and has been authorized for detection and/or diagnosis of SARS-CoV-2 by FDA under an Emergency Use Authorization (EUA). This EUA will remain in effect (meaning this test can be used) for the duration of the COVID-19 declaration under Section 564(b)(1) of the Act, 21 U.S.C. section 360bbb-3(b)(1), unless the authorization is terminated or revoked.  Performed at [facility], [address] [place], [place] [address]      Labs: BNP (last 3 results) No results for input(s): BNP in the last 8760 hours. Basic Metabolic Panel: Recent Labs  Lab 12/11/20 0821 12/12/20 0459 12/13/20 0645 12/14/20 0422 12/15/20 0504  NA 140 137 139 138 140  K 3.8 3.3* 3.4* 3.1* 3.2*  CL 101 104 104 106 108  CO2 GLUCOSE 113* 135* 126* 111* 99  BUN CREATININE 0.59 0.35* 0.41* 0.35* 0.35*  CALCIUM 8.8* 8.1* 8.3* 7.9* 7.8*  MG  --   --   --   --  1.6*   Liver Function Tests: No results for input(s): AST, ALT, ALKPHOS, BILITOT, PROT, ALBUMIN in the last 168 hours. No results for input(s): LIPASE, AMYLASE in the last 168 hours. No results for input(s): AMMONIA in the last 168 hours. CBC: Recent Labs  Lab 12/11/20 0821  12/12/20 0459 12/13/20 0645 12/14/20 0422 12/14/20 1128 12/15/20 0504  WBC 13.7* 11.4* 11.9* 8.7  --  9.2  NEUTROABS 12.3*  --   --   --   --   --   HGB 10.4* 8.3* 8.2* 7.3* 7.4* 7.3*  HCT 31.9* 24.9* 24.6* 22.8*  --  23.1*  MCV 95.5 95.8 94.6 96.2  --  94.3  PLT 405* 310 318 281  --  318   Cardiac Enzymes: Recent Labs  Lab 12/11/20 0821  CKTOTAL 76   BNP: Invalid input(s): POCBNP CBG: Recent Labs  Lab 12/11/20 1717  GLUCAP 125*   D-Dimer No results for input(s): DDIMER in the last 72 hours. Hgb A1c No results for input(s): HGBA1C in the last 72 hours. Lipid Profile No results for input(s): CHOL, HDL, LDLCALC, TRIG, CHOLHDL, LDLDIRECT in the last 72 hours. Thyroid function studies No results for input(s): TSH, T4TOTAL, T3FREE, THYROIDAB in the last 72 hours.  Invalid input(s): FREET3 Anemia work up No results for input(s): VITAMINB12, FOLATE, FERRITIN, TIBC, IRON, RETICCTPCT in the last 72 hours. Urinalysis    Component Value Date/Time   COLORURINE ORANGE (A) 07/31/2013 2053   APPEARANCEUR CLOUDY (A) 07/31/2013 2053   LABSPEC 1.026 07/31/2013 2053   PHURINE 5.0 07/31/2013 2053   GLUCOSEU NEGATIVE 07/31/2013 2053   HGBUR TRACE (A) 07/31/2013 2053   BILIRUBINUR Negative 03/20/2014 0930   KETONESUR >80 (A) 07/31/2013 2053   PROTEINUR Negative 03/20/2014 0930   PROTEINUR NEGATIVE 07/31/2013 2053   UROBILINOGEN 0.2 03/20/2014 0930   UROBILINOGEN 1.0 07/31/2013 2053   NITRITE Negative 03/20/2014 0930   NITRITE POSITIVE (A) 07/31/2013 2053   LEUKOCYTESUR Negative 03/20/2014 0930   Sepsis Labs Invalid input(s): PROCALCITONIN,  WBC,  LACTICIDVEN Microbiology Recent Results (from the past 240 hour(s))  Resp Panel by RT-PCR (Flu A&B, Covid) Nasopharyngeal Swab     Status: None   Collection Time: 12/11/20  9:52 AM   Specimen: Nasopharyngeal Swab; Nasopharyngeal(NP) swabs in vial transport medium  Result Value Ref Range Status   SARS Coronavirus 2 by RT PCR  NEGATIVE NEGATIVE Final    Comment: (NOTE) SARS-CoV-2 target nucleic acids are NOT  DETECTED.  The SARS-CoV-2 RNA is generally detectable in upper respiratory specimens during the acute phase of infection. The lowest concentration of SARS-CoV-2 viral copies this assay can detect is 138 copies/mL. A negative result does not preclude SARS-Cov-2 infection and should not be used as the sole basis for treatment or other patient management decisions. A negative result may occur with  improper specimen collection/handling, submission of specimen other than nasopharyngeal swab, presence of viral mutation(s) within the areas targeted by this assay, and inadequate number of viral copies(<138 copies/mL). A negative result must be combined with clinical observations, patient history, and epidemiological information. The expected result is Negative.  Fact Sheet for Patients:  BloggerCourse.com  Fact Sheet for Healthcare Providers:  SeriousBroker.it  This test is no t yet approved or cleared by the [place] FDA and  has been authorized for detection and/or diagnosis of SARS-CoV-2 by FDA under an Emergency Use Authorization (EUA). This EUA will remain  in effect (meaning this test can be used) for the duration of the COVID-19 declaration under Section 564(b)(1) of the Act, 21 U.S.C.section 360bbb-3(b)(1), unless the authorization is terminated  or revoked sooner.       Influenza A by PCR NEGATIVE NEGATIVE Final   Influenza B by PCR NEGATIVE NEGATIVE Final    Comment: (NOTE) The Xpert Xpress SARS-CoV-2/FLU/RSV plus assay is intended as an aid in the diagnosis of influenza from Nasopharyngeal swab specimens and should not be used as a sole basis for treatment. Nasal washings and aspirates are unacceptable for Xpert Xpress SARS-CoV-2/FLU/RSV testing.  Fact Sheet for Patients: BloggerCourse.com  Fact Sheet for  Healthcare Providers: SeriousBroker.it  This test is not yet approved or cleared by the [place] FDA and has been authorized for detection and/or diagnosis of SARS-CoV-2 by FDA under an Emergency Use Authorization (EUA). This EUA will remain in effect (meaning this test can be used) for the duration of the COVID-19 declaration under Section 564(b)(1) of the Act, 21 U.S.C. section 360bbb-3(b)(1), unless the authorization is terminated or revoked.  Performed at [facility], [address] [place], [place] [address]   Blood culture (single)     Status: None (Preliminary result)   Collection Time: 12/11/20  9:52 AM   Specimen: BLOOD  Result Value Ref Range Status   Specimen Description BLOOD LEFT ANTECUBITAL  Final   Special Requests   Final    BOTTLES DRAWN AEROBIC AND ANAEROBIC Blood Culture results may not be optimal due to an inadequate volume of blood received in culture bottles   Culture   Final    NO GROWTH 4 DAYS Performed at [facility], [address], [place], [place] [address]    Report Status PENDING  Incomplete  Resp Panel by RT-PCR (Flu A&B, Covid) Nasopharyngeal Swab     Status: None   Collection Time: 12/15/20 12:28 PM   Specimen: Nasopharyngeal Swab; Nasopharyngeal(NP) swabs in vial transport medium  Result Value Ref Range Status   SARS Coronavirus 2 by RT PCR NEGATIVE NEGATIVE Final    Comment: (NOTE) SARS-CoV-2 target nucleic acids are NOT DETECTED.  The SARS-CoV-2 RNA is generally detectable in upper respiratory specimens during the acute phase of infection. The lowest concentration of SARS-CoV-2 viral copies this assay can detect is 138 copies/mL. A negative result does not preclude SARS-Cov-2 infection and should not be used as the sole basis for treatment or other patient management decisions. A negative result may occur with  improper specimen collection/handling, submission of specimen other than  nasopharyngeal swab, presence of viral mutation(s) within the areas targeted by this assay, and inadequate number of viral copies(<138 copies/mL). A negative result must be combined with clinical observations, patient history, and epidemiological information. The expected result is Negative.  Fact Sheet for Patients:  BloggerCourse.com  Fact Sheet for Healthcare Providers:  SeriousBroker.it  This test is no t yet approved or cleared by the [place] FDA and  has been authorized for detection and/or diagnosis of SARS-CoV-2 by FDA under an Emergency Use Authorization (EUA). This EUA will remain  in effect (meaning this test can be used) for the duration of the COVID-19 declaration under Section 564(b)(1) of the Act, 21 U.S.C.section 360bbb-3(b)(1), unless the authorization is terminated  or revoked sooner.       Influenza A by PCR NEGATIVE NEGATIVE Final   Influenza B by PCR NEGATIVE NEGATIVE Final    Comment: (NOTE) The Xpert Xpress SARS-CoV-2/FLU/RSV plus assay is intended as an aid in the diagnosis of influenza from Nasopharyngeal swab specimens and should not be used as a sole basis for treatment. Nasal washings and aspirates are unacceptable for Xpert Xpress SARS-CoV-2/FLU/RSV testing.  Fact Sheet for Patients: BloggerCourse.com  Fact Sheet for Healthcare Providers: SeriousBroker.it  This test is not yet approved or cleared by the [place] FDA and has been authorized for detection and/or diagnosis of SARS-CoV-2 by FDA under an Emergency Use Authorization (EUA). This EUA will remain in effect (meaning this test can be used) for the duration of the COVID-19 declaration under Section 564(b)(1) of the Act, 21 U.S.C. section 360bbb-3(b)(1), unless the authorization is terminated or revoked.  Performed at [facility], [address] [place], [place]  [address]      Time coordinating discharge: Over 30 minutes  SIGNED:   Charise Killian, MD  [facility] 12/15/2020, 1:24 PM Pager   If 7PM-7AM, please contact night-coverage

## 2020-12-16 DIAGNOSIS — E785 Hyperlipidemia, unspecified: Secondary | ICD-10-CM | POA: Diagnosis not present

## 2020-12-16 DIAGNOSIS — S72001D Fracture of unspecified part of neck of right femur, subsequent encounter for closed fracture with routine healing: Secondary | ICD-10-CM | POA: Diagnosis not present

## 2020-12-16 DIAGNOSIS — I10 Essential (primary) hypertension: Secondary | ICD-10-CM | POA: Diagnosis not present

## 2020-12-16 LAB — CULTURE, BLOOD (SINGLE): Culture: NO GROWTH

## 2020-12-17 DIAGNOSIS — I10 Essential (primary) hypertension: Secondary | ICD-10-CM | POA: Diagnosis not present

## 2020-12-17 DIAGNOSIS — E785 Hyperlipidemia, unspecified: Secondary | ICD-10-CM | POA: Diagnosis not present

## 2020-12-17 DIAGNOSIS — M84459D Pathological fracture, hip, unspecified, subsequent encounter for fracture with routine healing: Secondary | ICD-10-CM | POA: Diagnosis not present

## 2020-12-22 DIAGNOSIS — S7291XD Unspecified fracture of right femur, subsequent encounter for closed fracture with routine healing: Secondary | ICD-10-CM | POA: Diagnosis not present

## 2020-12-22 DIAGNOSIS — H9319 Tinnitus, unspecified ear: Secondary | ICD-10-CM | POA: Diagnosis not present

## 2020-12-25 DIAGNOSIS — S42291A Other displaced fracture of upper end of right humerus, initial encounter for closed fracture: Secondary | ICD-10-CM | POA: Diagnosis not present

## 2020-12-25 DIAGNOSIS — S42301D Unspecified fracture of shaft of humerus, right arm, subsequent encounter for fracture with routine healing: Secondary | ICD-10-CM | POA: Diagnosis not present

## 2020-12-25 DIAGNOSIS — K5900 Constipation, unspecified: Secondary | ICD-10-CM | POA: Diagnosis not present

## 2020-12-25 DIAGNOSIS — S7291XD Unspecified fracture of right femur, subsequent encounter for closed fracture with routine healing: Secondary | ICD-10-CM | POA: Diagnosis not present

## 2020-12-25 DIAGNOSIS — Z96641 Presence of right artificial hip joint: Secondary | ICD-10-CM | POA: Diagnosis not present

## 2020-12-29 DIAGNOSIS — S7291XD Unspecified fracture of right femur, subsequent encounter for closed fracture with routine healing: Secondary | ICD-10-CM | POA: Diagnosis not present

## 2020-12-29 DIAGNOSIS — S42301D Unspecified fracture of shaft of humerus, right arm, subsequent encounter for fracture with routine healing: Secondary | ICD-10-CM | POA: Diagnosis not present

## 2020-12-29 DIAGNOSIS — E876 Hypokalemia: Secondary | ICD-10-CM | POA: Diagnosis not present

## 2021-01-01 DIAGNOSIS — S42301D Unspecified fracture of shaft of humerus, right arm, subsequent encounter for fracture with routine healing: Secondary | ICD-10-CM | POA: Diagnosis not present

## 2021-01-01 DIAGNOSIS — E876 Hypokalemia: Secondary | ICD-10-CM | POA: Diagnosis not present

## 2021-01-01 DIAGNOSIS — S7291XD Unspecified fracture of right femur, subsequent encounter for closed fracture with routine healing: Secondary | ICD-10-CM | POA: Diagnosis not present

## 2021-01-01 DIAGNOSIS — K5900 Constipation, unspecified: Secondary | ICD-10-CM | POA: Diagnosis not present

## 2021-01-08 DIAGNOSIS — S42291D Other displaced fracture of upper end of right humerus, subsequent encounter for fracture with routine healing: Secondary | ICD-10-CM | POA: Diagnosis not present

## 2021-01-13 DIAGNOSIS — R918 Other nonspecific abnormal finding of lung field: Secondary | ICD-10-CM | POA: Diagnosis not present

## 2021-01-13 DIAGNOSIS — R059 Cough, unspecified: Secondary | ICD-10-CM | POA: Diagnosis not present

## 2021-01-13 DIAGNOSIS — I700 Atherosclerosis of aorta: Secondary | ICD-10-CM | POA: Diagnosis not present

## 2021-01-13 DIAGNOSIS — R079 Chest pain, unspecified: Secondary | ICD-10-CM | POA: Diagnosis not present

## 2021-01-13 DIAGNOSIS — Z03818 Encounter for observation for suspected exposure to other biological agents ruled out: Secondary | ICD-10-CM | POA: Diagnosis not present

## 2021-01-13 DIAGNOSIS — I517 Cardiomegaly: Secondary | ICD-10-CM | POA: Diagnosis not present

## 2021-01-13 DIAGNOSIS — J40 Bronchitis, not specified as acute or chronic: Secondary | ICD-10-CM | POA: Diagnosis not present

## 2021-01-17 DIAGNOSIS — J208 Acute bronchitis due to other specified organisms: Secondary | ICD-10-CM | POA: Diagnosis not present

## 2021-01-17 DIAGNOSIS — U071 COVID-19: Secondary | ICD-10-CM | POA: Diagnosis not present

## 2021-01-22 DIAGNOSIS — Z8616 Personal history of COVID-19: Secondary | ICD-10-CM | POA: Diagnosis not present

## 2021-01-22 DIAGNOSIS — B9689 Other specified bacterial agents as the cause of diseases classified elsewhere: Secondary | ICD-10-CM | POA: Diagnosis not present

## 2021-01-22 DIAGNOSIS — J209 Acute bronchitis, unspecified: Secondary | ICD-10-CM | POA: Diagnosis not present

## 2021-01-22 DIAGNOSIS — J019 Acute sinusitis, unspecified: Secondary | ICD-10-CM | POA: Diagnosis not present

## 2021-01-31 DIAGNOSIS — M6281 Muscle weakness (generalized): Secondary | ICD-10-CM | POA: Diagnosis not present

## 2021-01-31 DIAGNOSIS — S72001A Fracture of unspecified part of neck of right femur, initial encounter for closed fracture: Secondary | ICD-10-CM | POA: Diagnosis not present

## 2021-02-01 DIAGNOSIS — F01518 Vascular dementia, unspecified severity, with other behavioral disturbance: Secondary | ICD-10-CM | POA: Diagnosis not present

## 2021-02-08 DIAGNOSIS — S42291D Other displaced fracture of upper end of right humerus, subsequent encounter for fracture with routine healing: Secondary | ICD-10-CM | POA: Diagnosis not present

## 2021-02-08 DIAGNOSIS — S72001D Fracture of unspecified part of neck of right femur, subsequent encounter for closed fracture with routine healing: Secondary | ICD-10-CM | POA: Diagnosis not present

## 2021-02-08 DIAGNOSIS — Z96641 Presence of right artificial hip joint: Secondary | ICD-10-CM | POA: Diagnosis not present

## 2021-02-18 ENCOUNTER — Ambulatory Visit
Admission: RE | Admit: 2021-02-18 | Discharge: 2021-02-18 | Disposition: A | Discharge status: Home / Self Care | Payer: Medicare Other | Source: Ambulatory Visit | Attending: Family Medicine | Admitting: Family Medicine

## 2021-02-18 ENCOUNTER — Other Ambulatory Visit: Payer: Self-pay | Admitting: Family Medicine

## 2021-02-18 ENCOUNTER — Other Ambulatory Visit: Payer: Self-pay

## 2021-02-18 DIAGNOSIS — M7989 Other specified soft tissue disorders: Secondary | ICD-10-CM

## 2021-02-18 DIAGNOSIS — I959 Hypotension, unspecified: Secondary | ICD-10-CM | POA: Diagnosis not present

## 2021-02-18 DIAGNOSIS — I82451 Acute embolism and thrombosis of right peroneal vein: Secondary | ICD-10-CM | POA: Diagnosis not present

## 2021-02-18 DIAGNOSIS — I82401 Acute embolism and thrombosis of unspecified deep veins of right lower extremity: Secondary | ICD-10-CM | POA: Diagnosis not present

## 2021-02-22 DIAGNOSIS — W010XXA Fall on same level from slipping, tripping and stumbling without subsequent striking against object, initial encounter: Secondary | ICD-10-CM | POA: Diagnosis not present

## 2021-02-22 DIAGNOSIS — M25551 Pain in right hip: Secondary | ICD-10-CM | POA: Diagnosis not present

## 2021-02-22 DIAGNOSIS — Y92009 Unspecified place in unspecified non-institutional (private) residence as the place of occurrence of the external cause: Secondary | ICD-10-CM | POA: Diagnosis not present

## 2021-03-03 DIAGNOSIS — S72001A Fracture of unspecified part of neck of right femur, initial encounter for closed fracture: Secondary | ICD-10-CM | POA: Diagnosis not present

## 2021-03-03 DIAGNOSIS — I824Y1 Acute embolism and thrombosis of unspecified deep veins of right proximal lower extremity: Secondary | ICD-10-CM | POA: Diagnosis not present

## 2021-03-03 DIAGNOSIS — Z96649 Presence of unspecified artificial hip joint: Secondary | ICD-10-CM | POA: Diagnosis not present

## 2021-03-03 DIAGNOSIS — M25551 Pain in right hip: Secondary | ICD-10-CM | POA: Diagnosis not present

## 2021-03-03 DIAGNOSIS — M6281 Muscle weakness (generalized): Secondary | ICD-10-CM | POA: Diagnosis not present

## 2021-03-05 DIAGNOSIS — F01518 Vascular dementia, unspecified severity, with other behavioral disturbance: Secondary | ICD-10-CM | POA: Diagnosis not present

## 2021-03-19 DIAGNOSIS — F01518 Vascular dementia, unspecified severity, with other behavioral disturbance: Secondary | ICD-10-CM | POA: Diagnosis not present

## 2021-03-19 DIAGNOSIS — Z86718 Personal history of other venous thrombosis and embolism: Secondary | ICD-10-CM | POA: Diagnosis not present

## 2021-03-19 DIAGNOSIS — Z9181 History of falling: Secondary | ICD-10-CM | POA: Diagnosis not present

## 2021-03-24 ENCOUNTER — Telehealth: Payer: Self-pay | Admitting: Student

## 2021-03-24 NOTE — Telephone Encounter (Signed)
Spoke with patient's husband Richarda Overlie, regarding the Palliative referral/services and all questions were answered and he was in agreement with scheduling visit.  I have scheduled an In-home Consult for 04/01/21 @ 10:30 AM

## 2021-03-30 ENCOUNTER — Emergency Department: Payer: Medicare Other

## 2021-03-30 ENCOUNTER — Other Ambulatory Visit: Payer: Self-pay

## 2021-03-30 ENCOUNTER — Inpatient Hospital Stay: Payer: Medicare Other

## 2021-03-30 ENCOUNTER — Inpatient Hospital Stay
Admission: EM | Admit: 2021-03-30 | Discharge: 2021-04-01 | DRG: 071 | Disposition: A | Discharge status: Home / Self Care | Payer: Medicare Other | Source: Ambulatory Visit | Attending: Internal Medicine | Admitting: Internal Medicine

## 2021-03-30 DIAGNOSIS — F01B0 Vascular dementia, moderate, without behavioral disturbance, psychotic disturbance, mood disturbance, and anxiety: Secondary | ICD-10-CM | POA: Diagnosis not present

## 2021-03-30 DIAGNOSIS — Z90711 Acquired absence of uterus with remaining cervical stump: Secondary | ICD-10-CM | POA: Diagnosis not present

## 2021-03-30 DIAGNOSIS — Z515 Encounter for palliative care: Secondary | ICD-10-CM | POA: Diagnosis not present

## 2021-03-30 DIAGNOSIS — F32A Depression, unspecified: Secondary | ICD-10-CM | POA: Diagnosis present

## 2021-03-30 DIAGNOSIS — F419 Anxiety disorder, unspecified: Secondary | ICD-10-CM | POA: Diagnosis present

## 2021-03-30 DIAGNOSIS — Z820 Family history of epilepsy and other diseases of the nervous system: Secondary | ICD-10-CM

## 2021-03-30 DIAGNOSIS — E860 Dehydration: Secondary | ICD-10-CM

## 2021-03-30 DIAGNOSIS — G9341 Metabolic encephalopathy: Principal | ICD-10-CM | POA: Diagnosis present

## 2021-03-30 DIAGNOSIS — M25552 Pain in left hip: Secondary | ICD-10-CM | POA: Diagnosis not present

## 2021-03-30 DIAGNOSIS — Z8711 Personal history of peptic ulcer disease: Secondary | ICD-10-CM | POA: Diagnosis not present

## 2021-03-30 DIAGNOSIS — Z7982 Long term (current) use of aspirin: Secondary | ICD-10-CM | POA: Diagnosis not present

## 2021-03-30 DIAGNOSIS — R54 Age-related physical debility: Secondary | ICD-10-CM | POA: Diagnosis present

## 2021-03-30 DIAGNOSIS — F0154 Vascular dementia, unspecified severity, with anxiety: Secondary | ICD-10-CM | POA: Diagnosis present

## 2021-03-30 DIAGNOSIS — Z20822 Contact with and (suspected) exposure to covid-19: Secondary | ICD-10-CM | POA: Diagnosis not present

## 2021-03-30 DIAGNOSIS — I10 Essential (primary) hypertension: Secondary | ICD-10-CM | POA: Diagnosis not present

## 2021-03-30 DIAGNOSIS — Z833 Family history of diabetes mellitus: Secondary | ICD-10-CM

## 2021-03-30 DIAGNOSIS — Z8616 Personal history of COVID-19: Secondary | ICD-10-CM

## 2021-03-30 DIAGNOSIS — Z23 Encounter for immunization: Secondary | ICD-10-CM

## 2021-03-30 DIAGNOSIS — Z79890 Hormone replacement therapy (postmenopausal): Secondary | ICD-10-CM

## 2021-03-30 DIAGNOSIS — E119 Type 2 diabetes mellitus without complications: Secondary | ICD-10-CM | POA: Diagnosis not present

## 2021-03-30 DIAGNOSIS — I672 Cerebral atherosclerosis: Secondary | ICD-10-CM | POA: Diagnosis not present

## 2021-03-30 DIAGNOSIS — G9340 Encephalopathy, unspecified: Secondary | ICD-10-CM | POA: Diagnosis not present

## 2021-03-30 DIAGNOSIS — R5383 Other fatigue: Secondary | ICD-10-CM | POA: Diagnosis not present

## 2021-03-30 DIAGNOSIS — W19XXXA Unspecified fall, initial encounter: Secondary | ICD-10-CM

## 2021-03-30 DIAGNOSIS — Z79899 Other long term (current) drug therapy: Secondary | ICD-10-CM | POA: Diagnosis not present

## 2021-03-30 DIAGNOSIS — Z9049 Acquired absence of other specified parts of digestive tract: Secondary | ICD-10-CM

## 2021-03-30 DIAGNOSIS — M1990 Unspecified osteoarthritis, unspecified site: Secondary | ICD-10-CM | POA: Diagnosis present

## 2021-03-30 DIAGNOSIS — I517 Cardiomegaly: Secondary | ICD-10-CM | POA: Diagnosis not present

## 2021-03-30 DIAGNOSIS — Z8673 Personal history of transient ischemic attack (TIA), and cerebral infarction without residual deficits: Secondary | ICD-10-CM

## 2021-03-30 DIAGNOSIS — R410 Disorientation, unspecified: Secondary | ICD-10-CM | POA: Diagnosis not present

## 2021-03-30 DIAGNOSIS — Z7401 Bed confinement status: Secondary | ICD-10-CM

## 2021-03-30 DIAGNOSIS — Z87442 Personal history of urinary calculi: Secondary | ICD-10-CM | POA: Diagnosis not present

## 2021-03-30 DIAGNOSIS — E785 Hyperlipidemia, unspecified: Secondary | ICD-10-CM | POA: Diagnosis present

## 2021-03-30 DIAGNOSIS — Z7189 Other specified counseling: Secondary | ICD-10-CM | POA: Diagnosis not present

## 2021-03-30 DIAGNOSIS — R296 Repeated falls: Secondary | ICD-10-CM | POA: Diagnosis present

## 2021-03-30 DIAGNOSIS — R4182 Altered mental status, unspecified: Secondary | ICD-10-CM

## 2021-03-30 DIAGNOSIS — Z8249 Family history of ischemic heart disease and other diseases of the circulatory system: Secondary | ICD-10-CM

## 2021-03-30 DIAGNOSIS — R627 Adult failure to thrive: Secondary | ICD-10-CM | POA: Diagnosis present

## 2021-03-30 LAB — COMPREHENSIVE METABOLIC PANEL
ALT: 10 U/L (ref 0–44)
AST: 20 U/L (ref 15–41)
Albumin: 3.5 g/dL (ref 3.5–5.0)
Alkaline Phosphatase: 78 U/L (ref 38–126)
Anion gap: 10 (ref 5–15)
BUN: 32 mg/dL — ABNORMAL HIGH (ref 8–23)
CO2: 26 mmol/L (ref 22–32)
Calcium: 9.3 mg/dL (ref 8.9–10.3)
Chloride: 103 mmol/L (ref 98–111)
Creatinine, Ser: 0.57 mg/dL (ref 0.44–1.00)
GFR, Estimated: >60 mL/min (ref >60)
Glucose, Bld: 78 mg/dL (ref 70–99)
Potassium: 3.8 mmol/L (ref 3.5–5.1)
Sodium: 139 mmol/L (ref 135–145)
Total Bilirubin: 1.3 mg/dL — ABNORMAL HIGH (ref 0.3–1.2)
Total Protein: 6.3 g/dL — ABNORMAL LOW (ref 6.5–8.1)

## 2021-03-30 LAB — CBC WITH DIFFERENTIAL/PLATELET
Abs Immature Granulocytes: 0.02 10*3/uL (ref 0.00–0.07)
Basophils Absolute: 0 10*3/uL (ref 0.0–0.1)
Basophils Relative: 1 %
Eosinophils Absolute: 0.2 10*3/uL (ref 0.0–0.5)
Eosinophils Relative: 3 %
HCT: 39.6 % (ref 36.0–46.0)
Hemoglobin: 12.6 g/dL (ref 12.0–15.0)
Immature Granulocytes: 0 %
Lymphocytes Relative: 10 %
Lymphs Abs: 0.9 10*3/uL (ref 0.7–4.0)
MCH: 30.8 pg (ref 26.0–34.0)
MCHC: 31.8 g/dL (ref 30.0–36.0)
MCV: 96.8 fL (ref 80.0–100.0)
Monocytes Absolute: 0.8 10*3/uL (ref 0.1–1.0)
Monocytes Relative: 10 %
Neutro Abs: 6.6 10*3/uL (ref 1.7–7.7)
Neutrophils Relative %: 76 %
Platelets: 184 10*3/uL (ref 150–400)
RBC: 4.09 MIL/uL (ref 3.87–5.11)
RDW: 13.1 % (ref 11.5–15.5)
WBC: 8.6 10*3/uL (ref 4.0–10.5)
nRBC: 0 % (ref 0.0–0.2)

## 2021-03-30 LAB — URINALYSIS, ROUTINE W REFLEX MICROSCOPIC
Glucose, UA: NEGATIVE mg/dL
Hgb urine dipstick: NEGATIVE
Ketones, ur: >160 mg/dL — AB
Leukocytes,Ua: NEGATIVE
Nitrite: NEGATIVE
Protein, ur: NEGATIVE mg/dL
Specific Gravity, Urine: >1.03 — ABNORMAL HIGH (ref 1.005–1.030)
pH: 5.5 (ref 5.0–8.0)

## 2021-03-30 LAB — CBG MONITORING, ED: Glucose-Capillary: 76 mg/dL (ref 70–99)

## 2021-03-30 LAB — VITAMIN B12: Vitamin B-12: 2701 pg/mL — ABNORMAL HIGH (ref 180–914)

## 2021-03-30 LAB — LACTIC ACID, PLASMA: Lactic Acid, Venous: 0.9 mmol/L (ref 0.5–1.9)

## 2021-03-30 LAB — RESP PANEL BY RT-PCR (FLU A&B, COVID) ARPGX2
Influenza A by PCR: NEGATIVE
Influenza B by PCR: NEGATIVE
SARS Coronavirus 2 by RT PCR: NEGATIVE

## 2021-03-30 LAB — TSH: TSH: 0.405 u[IU]/mL (ref 0.350–4.500)

## 2021-03-30 LAB — VALPROIC ACID LEVEL: Valproic Acid Lvl: 75 ug/mL (ref 50.0–100.0)

## 2021-03-30 MED ORDER — INFLUENZA VAC A&B SA ADJ QUAD 0.5 ML IM PRSY
0.5000 mL | PREFILLED_SYRINGE | INTRAMUSCULAR | Status: AC
  Administered 2021-04-01: 0.5 mL via INTRAMUSCULAR
  Filled 2021-03-30: qty 0.5

## 2021-03-30 MED ORDER — VORTIOXETINE HBR 5 MG PO TABS
20.0000 mg | ORAL_TABLET | Freq: Every day | ORAL | Status: DC
  Administered 2021-03-31: 19:00:00 20 mg via ORAL
  Filled 2021-03-30 (×3): qty 4

## 2021-03-30 MED ORDER — PANTOPRAZOLE SODIUM 40 MG PO TBEC
40.0000 mg | DELAYED_RELEASE_TABLET | Freq: Every day | ORAL | Status: DC
  Filled 2021-03-30: qty 1

## 2021-03-30 MED ORDER — SODIUM CHLORIDE 0.9 % IV BOLUS (SEPSIS)
1000.0000 mL | Freq: Once | INTRAVENOUS | Status: AC
  Administered 2021-03-30: 1000 mL via INTRAVENOUS

## 2021-03-30 MED ORDER — MEMANTINE HCL 5 MG PO TABS
5.0000 mg | ORAL_TABLET | Freq: Two times a day (BID) | ORAL | Status: DC
  Administered 2021-03-31 – 2021-04-01 (×2): 5 mg via ORAL
  Filled 2021-03-30 (×3): qty 1

## 2021-03-30 MED ORDER — ATORVASTATIN CALCIUM 20 MG PO TABS
80.0000 mg | ORAL_TABLET | Freq: Every day | ORAL | Status: DC
  Administered 2021-04-01: 11:00:00 80 mg via ORAL
  Filled 2021-03-30 (×2): qty 4

## 2021-03-30 MED ORDER — ENOXAPARIN SODIUM 40 MG/0.4ML IJ SOSY
40.0000 mg | PREFILLED_SYRINGE | INTRAMUSCULAR | Status: DC
  Administered 2021-03-30 – 2021-03-31 (×2): 40 mg via SUBCUTANEOUS
  Filled 2021-03-30 (×2): qty 0.4

## 2021-03-30 MED ORDER — LACTATED RINGERS IV SOLN: INTRAVENOUS | Status: DC

## 2021-03-30 MED ORDER — SODIUM CHLORIDE 0.9 % IV SOLN
2.0000 g | INTRAVENOUS | Status: DC
  Administered 2021-03-30: 2 g via INTRAVENOUS
  Filled 2021-03-30: qty 20

## 2021-03-30 MED ORDER — THIAMINE HCL 100 MG/ML IJ SOLN
500.0000 mg | Freq: Three times a day (TID) | INTRAVENOUS | Status: DC
  Administered 2021-03-30 – 2021-04-01 (×5): 500 mg via INTRAVENOUS
  Filled 2021-03-30 (×8): qty 5

## 2021-03-30 MED ORDER — ASPIRIN EC 81 MG PO TBEC
81.0000 mg | DELAYED_RELEASE_TABLET | Freq: Every day | ORAL | Status: DC
  Administered 2021-04-01: 11:00:00 81 mg via ORAL
  Filled 2021-03-30 (×2): qty 1

## 2021-03-30 NOTE — ED Triage Notes (Signed)
First nurse Note:  Arrives from [facility] for ED evaluation of decreased appetite, low RA sats, lethargy.  Spouse states symptoms started yesterday.  Initial RA sats in office 88, but improved to 92%

## 2021-03-30 NOTE — ED Provider Notes (Signed)
[facility] Emergency Department Provider Note  ____________________________________________  Time seen: Approximately 12:43 PM  I have reviewed the triage vital signs and the nursing notes.   HISTORY  Chief Complaint Weakness    Level 5 Caveat: Portions of the History and Physical including HPI and review of systems are unable to be completely obtained due to patient altered mental status and confusion  HPI Crystal Newton is a 75 y.o. female with a history of diabetes, hypertension, prior strokes, advanced dementia who is brought to the ED due to decreased level of alertness, worsening confusion, not eating or drinking for the last 2 days.  Gradual onset, constant, worsening.  According to her husband she was in her usual state of health 2 days ago.  No falls or trauma.    Past Medical History:  Diagnosis Date   Anxiety    Arthritis    Depression    Diabetes mellitus    diet controlled   Headache    migraine headache   History of kidney stones    Hyperlipidemia    Hypertension    Memory difficulties    Sleep disorder    Stroke [facility])    TIA (transient ischemic attack)    found on MRI.     Patient Active Problem List   Diagnosis Date Noted   Closed right hip fracture (HCC) 12/11/2020   Stroke (HCC) 12/11/2020   Humerus fracture-right 12/11/2020   Fall    Cold intolerance 12/12/2019   Acute pain of right shoulder 12/12/2019   Acute thoracic back pain 12/14/2018   Weight loss, unintentional 09/28/2018   Chronic insomnia 03/14/2017   Digital mucinous cyst of finger of right hand 09/09/2016   Bilateral hip bursitis 03/08/2016   Right rotator cuff tendinitis 06/30/2015   CAP (community acquired pneumonia) 06/16/2015   Counseling regarding end of life decision making 02/03/2015   Dementia, vascular, mixed, with behavioral disturbance 06/14/2012   OSTEOARTHROSIS UNSPEC WHETHER GEN/LOCALIZED HAND 01/14/2008   Diabetes mellitus with no  complication (HCC) 12/21/2006   Hyperlipidemia LDL goal <100 12/21/2006   Generalized anxiety disorder 12/21/2006   MDD (major depressive disorder), recurrent episode, severe (HCC) 12/21/2006   COMMON MIGRAINE 12/21/2006   Essential hypertension, benign 12/21/2006   PUD 12/21/2006     Past Surgical History:  Procedure Laterality Date   ABDOMINAL HYSTERECTOMY     BREAST BIOPSY     CATARACT EXTRACTION W/PHACO Right 10/17/2019   Procedure: CATARACT EXTRACTION PHACO AND INTRAOCULAR LENS PLACEMENT (IOC) RIGHT DIABETIC VISION BLUE 9.96  00:59.0;  Surgeon: Elliot Cousin, MD;  Location: [facility];  Service: Ophthalmology;  Laterality: Right;  Diabetic - diet controlled   DILATION AND CURETTAGE OF UTERUS     ESOPHAGOGASTRODUODENOSCOPY (EGD) WITH PROPOFOL N/A 09/21/2020   Procedure: ESOPHAGOGASTRODUODENOSCOPY (EGD) WITH PROPOFOL;  Surgeon: Regis Bill, MD;  Location: [facility];  Service: Endoscopy;  Laterality: N/A;  REQUESTS AM   HIP ARTHROPLASTY Right 12/11/2020   Procedure: ARTHROPLASTY BIPOLAR HIP (HEMIARTHROPLASTY);  Surgeon: Christena Flake, MD;  Location: [facility];  Service: Orthopedics;  Laterality: Right;   PARTIAL COLECTOMY  1996   ruptured abcess for diverticulitis   PARTIAL HYSTERECTOMY     uterine prolapse   right parotid     TONSILLECTOMY       Prior to Admission medications   Medication Sig Start Date End Date Taking? Authorizing Provider  aspirin 81 MG EC tablet Take 81 mg by mouth daily.    [provider]  atorvastatin (LIPITOR) 80 MG tablet TAKE 1 TABLET BY MOUTH EVERY DAY 12/02/19   Bedsole, Amy E, MD  calcium carbonate (CALCIUM 600) 1500 (600 Ca) MG TABS tablet Take 1 tablet by mouth daily with breakfast.    [provider]  cetirizine (ZYRTEC) 10 MG tablet Take 10 mg by mouth at bedtime.    [provider]  divalproex (DEPAKOTE) 125 MG DR tablet Take 125 mg by mouth 3 (three) times daily.    [provider]  donepezil  (ARICEPT) 10 MG tablet TAKE 1 TABLET BY MOUTH EVERY DAY EVERY NIGHT 11/18/19   Bedsole, Amy E, MD  enoxaparin (LOVENOX) 40 MG/0.4ML injection Inject 0.4 mLs (40 mg total) into the skin daily. 12/16/20   Anson Oregon, PA-C  hyoscyamine (LEVBID) 0.375 MG 12 hr tablet Take 0.375 mg by mouth every 12 (twelve) hours as needed for cramping.    [provider]  lisinopril (ZESTRIL) 5 MG tablet TAKE 1 TABLET BY MOUTH EVERY DAY 01/02/20   Bedsole, Amy E, MD  Melatonin 10 MG TABS Take 30 mg by mouth at bedtime.    [provider]  memantine (NAMENDA) 10 MG tablet Take 0.5 tablets (5 mg total) by mouth 2 (two) times daily. 10/15/19   Bedsole, Amy E, MD  Multiple Vitamins-Minerals (CENTRUM SILVER PO) Take 1 tablet by mouth daily.    [provider]  nitroGLYCERIN (NITROSTAT) 0.3 MG SL tablet Place 1 tablet (0.3 mg total) under the tongue every 5 (five) minutes as needed for chest pain (call your doctor if pain persists after 3 doses). 05/28/20 05/28/21  Shaune Pollack, MD  omeprazole (PRILOSEC) 20 MG capsule Take 40 mg by mouth daily with supper.    [provider]  oxyCODONE-acetaminophen (PERCOCET/ROXICET) 5-325 MG tablet Take 0.5-1 tablets by mouth every 8 (eight) hours as needed for moderate pain or severe pain. 12/15/20   Anson Oregon, PA-C  propranolol (INDERAL) 40 MG tablet TAKE 1 TABLET BY MOUTH TWICE A DAY 06/15/20   Bedsole, Amy E, MD  risperiDONE (RISPERDAL) 0.5 MG tablet Take 0.5-1 mg by mouth See admin instructions. Take 1 tablet (0.5mg ) by mouth every morning, take 2 tablets ( ) by mouth every afternoon and take 1 tablet (0.5mg ) by mouth at bedtime    [provider]  vitamin B-12 (CYANOCOBALAMIN) 1000 MCG tablet Take 1,000 mcg by mouth daily.    [provider]  Vitamin D3 (VITAMIN D) 25 MCG tablet Take 1,000 Units by mouth daily.    [provider]  vortioxetine HBr (TRINTELLIX) 20 MG TABS tablet Take 20 mg by mouth daily with  supper.    [provider]     Allergies Patient has no known allergies.   Family History  Problem Relation Age of Onset   Alzheimer's disease Mother    Diabetes Maternal Grandfather    Coronary artery disease Maternal Grandfather     Social History Social History   Tobacco Use   Smoking status: Never   Smokeless tobacco: Never  Vaping Use   Vaping Use: Never used  Substance Use Topics   Alcohol use: No    Alcohol/week: 0.0 standard drinks   Drug use: No    Review of Systems Level 5 Caveat: Portions of the History and Physical including HPI and review of systems are unable to be completely obtained due to patient being a poor historian   Constitutional:   No known fever.  ENT:   No rhinorrhea. Cardiovascular:   No  chest pain or syncope. Respiratory:   No dyspnea or cough. Gastrointestinal:   Negative for abdominal pain, vomiting and diarrhea.  Musculoskeletal:   Negative for focal pain or swelling ____________________________________________   PHYSICAL EXAM:  VITAL SIGNS: ED Triage Vitals  Enc Vitals Group     BP 03/30/21 1138 103/81     Pulse Rate 03/30/21 1138 66     Resp 03/30/21 1138 20     Temp 03/30/21 1210 (!) 96.7 F (35.9 C)     Temp Source 03/30/21 1210 Axillary     SpO2 03/30/21 1138 94 %     Weight 03/30/21 1139 140 lb (63.5 kg)     Height --      Head Circumference --      Peak Flow --      Pain Score --      Pain Loc --      Pain Edu? --      Excl. in GC? --     Vital signs reviewed, nursing assessments reviewed.   Constitutional:   Somnolent, ill-appearing. Eyes:   Conjunctivae are normal. EOMI. PERRL. ENT      Head:   Normocephalic and atraumatic.      Nose:   No congestion/rhinnorhea.       Mouth/Throat:   Dry mucous membranes, no pharyngeal erythema. No peritonsillar mass.       Neck:   No meningismus. Full ROM. Hematological/Lymphatic/Immunilogical:   No cervical lymphadenopathy. Cardiovascular:   RRR.  1+ pulses x4  extremities.  No murmurs. Cap refill less than 2 seconds. Respiratory:   Normal respiratory effort without tachypnea/retractions. Breath sounds are clear and equal bilaterally. No wheezes/rales/rhonchi. Gastrointestinal:   Soft, nontender. Non distended.   No rebound, rigidity, or guarding. Genitourinary:   deferred Musculoskeletal:   Normal range of motion in all extremities. No joint effusions.  No lower extremity tenderness.  No edema. Neurologic:   Sparse speech, no coherent language response. Motor grossly intact.   Skin:    Skin is warm, dry and intact.  Poor skin turgor.  No rash noted.  No petechiae, purpura, or bullae.  ____________________________________________    LABS (pertinent positives/negatives) (all labs ordered are listed, but only abnormal results are displayed) Labs Reviewed  COMPREHENSIVE METABOLIC PANEL - Abnormal; Notable for the following components:      Result Value   BUN 32 (*)    Total Protein 6.3 (*)    Total Bilirubin 1.3 (*)    All other components within normal limits  CULTURE, BLOOD (ROUTINE X 2)  CULTURE, BLOOD (ROUTINE X 2)  RESP PANEL BY RT-PCR (FLU A&B, COVID) ARPGX2  LACTIC ACID, PLASMA  CBC WITH DIFFERENTIAL/PLATELET  URINALYSIS, ROUTINE W REFLEX MICROSCOPIC  CBG MONITORING, ED   ____________________________________________   EKG  Interpreted by me Sinus rhythm rate of 67, normal axis, normal intervals.  Poor R wave progression.  Normal ST segments and T waves.  No ischemic changes.  ____________________________________________    RADIOLOGY  CT HEAD WO CONTRAST ( )  Result Date: 03/30/2021 CLINICAL DATA:  Lethargy. EXAM: CT HEAD WITHOUT CONTRAST TECHNIQUE: Contiguous axial images were obtained from the base of the skull through the vertex without intravenous contrast. COMPARISON:  CT head dated December 11, 2020. FINDINGS: Brain: No evidence of acute infarction, hemorrhage, hydrocephalus, extra-axial collection or mass lesion/mass  effect. Stable mild atrophy and chronic microvascular ischemic changes. Vascular: Calcified atherosclerosis at the skull base. No hyperdense vessel. Skull: Normal. Negative for fracture or focal lesion. Sinuses/Orbits: No  acute finding. Other: None. IMPRESSION: 1. No acute intracranial abnormality. Electronically Signed   By: Obie Dredge M.D.   On: 03/30/2021 13:02   DG Chest Portable 1 View  Result Date: 03/30/2021 CLINICAL DATA:  Altered mental status, decreased appetite, confusion EXAM: PORTABLE CHEST 1 VIEW COMPARISON:  Portable exam 1211 hours compared 12/11/2020 FINDINGS: Borderline enlargement of cardiac silhouette. Atherosclerotic calcification aorta. Chronic peribronchial thickening with decreased RIGHT upper lobe opacity since prior exam. No acute infiltrate, pleural effusion or pneumothorax. Bones demineralized. IMPRESSION: Chronic bronchitic changes without acute infiltrate. Aortic Atherosclerosis (ICD10-I70.0). Electronically Signed   By: Ulyses Southward M.D.   On: 03/30/2021 12:30    ____________________________________________   PROCEDURES .Critical Care Performed by: Sharman Cheek, MD Authorized by: Sharman Cheek, MD   Critical care provider statement:    Critical care time (minutes):  35   Critical care time was exclusive of:  Separately billable procedures and treating other patients   Critical care was necessary to treat or prevent imminent or life-threatening deterioration of the following conditions:  Sepsis, shock and CNS failure or compromise   Critical care was time spent personally by me on the following activities:  Development of treatment plan with patient or surrogate, discussions with consultants, evaluation of patient's response to treatment, examination of patient, obtaining history from patient or surrogate, ordering and performing treatments and interventions, ordering and review of laboratory studies, ordering and review of radiographic studies, pulse  oximetry, re-evaluation of patient's condition and review of old charts  ____________________________________________  DIFFERENTIAL DIAGNOSIS   Intracranial hemorrhage, stroke, pneumonia, UTI, dehydration, septic shock  CLINICAL IMPRESSION / ASSESSMENT AND PLAN / ED COURSE  Medications ordered in the ED: Medications  cefTRIAXone (ROCEPHIN) 2 g in sodium chloride 0.9 % 100 mL IVPB (0 g Intravenous Stopped 03/30/21 1312)  sodium chloride 0.9 % bolus 1,000 mL (0 mLs Intravenous Stopped 03/30/21 1425)    And  sodium chloride 0.9 % bolus 1,000 mL (0 mLs Intravenous Stopped 03/30/21 1425)    Pertinent labs & imaging results that were available during my care of the patient were reviewed by me and considered in my medical decision making (see chart for details).   JENNAFER GLADUE was evaluated in Emergency Department on 03/30/2021 for the symptoms described in the history of present illness. She was evaluated in the context of the global COVID-19 pandemic, which necessitated consideration that the patient might be at risk for infection with the SARS-CoV-2 virus that causes COVID-19. Institutional protocols and algorithms that pertain to the evaluation of patients at risk for COVID-19 are in a state of rapid change based on information released by regulatory bodies including the CDC and federal and state organizations. These policies and algorithms were followed during the patient's care in the ED.     Clinical Course as of 03/30/21 1512  Tue Mar 30, 2021  1207 Patient presents with altered mental status.  Initial 2 blood pressures had a map of 56 and 64.  She appears substantially dehydrated, worrisome for infection and septic shock.  We will start large-volume fluid bolus, Rocephin, check labs. [PS]    Clinical Course User Index [PS] Sharman Cheek, MD     ----------------------------------------- 3:12 PM on 03/30/2021 ----------------------------------------- Vital signs remained  stable.  Blood pressure improving.  Lactate normal.  CT head chest x-ray and other labs are overall reassuring except for elevated BUN level.  After receiving 2 L IV fluid bolus, blood pressure is normal and she does not require vasopressors.  She does require continued hydration, and due to her altered mental status and profound generalized weakness, she will need hospitalization for further management.  Nursing will try In-N-Out cath again to see if a urine sample can be sent for analysis.  ____________________________________________   FINAL CLINICAL IMPRESSION(S) / ED DIAGNOSES    Final diagnoses:  Altered mental status, unspecified altered mental status type  Dehydration     ED Discharge Orders     None       Portions of this note were generated with dragon dictation software. Dictation errors may occur despite best attempts at proofreading.   Sharman Cheek, MD 03/30/21 [phone]

## 2021-03-30 NOTE — H&P (Signed)
History and Physical    Crystal Newton:[id number] DOB: 30-Aug-1945 DOA: 03/30/2021  PCP: Alm Bustard, NP  Patient coming from: home   Chief Complaint: not responding as usual  HPI: Crystal Newton is a 75 y.o. female with medical history significant for advanced vascular dementa, htn, cva, who presents with the above.  BAseline is bedbound, able to participate in feeding and self-care. Hip fracture this past august and husband who is caregive reports has had a relatively rapid decline since then. He notes for the past day patient is mostly unresponsive, not participating in care, not eating or drinking much. No fever, cough. No ulcers or other skin changes. No med changes.   ED Course:   Labs, CT head, fluids  Review of Systems: As per HPI otherwise 10 point review of systems negative.    Past Medical History:  Diagnosis Date   Anxiety    Arthritis    Depression    Diabetes mellitus    diet controlled   Headache    migraine headache   History of kidney stones    Hyperlipidemia    Hypertension    Memory difficulties    Sleep disorder    Stroke [facility])    TIA (transient ischemic attack)    found on MRI.    Past Surgical History:  Procedure Laterality Date   ABDOMINAL HYSTERECTOMY     BREAST BIOPSY     CATARACT EXTRACTION W/PHACO Right 10/17/2019   Procedure: CATARACT EXTRACTION PHACO AND INTRAOCULAR LENS PLACEMENT (IOC) RIGHT DIABETIC VISION BLUE 9.96  00:59.0;  Surgeon: Elliot Cousin, MD;  Location: [facility];  Service: Ophthalmology;  Laterality: Right;  Diabetic - diet controlled   DILATION AND CURETTAGE OF UTERUS     ESOPHAGOGASTRODUODENOSCOPY (EGD) WITH PROPOFOL N/A 09/21/2020   Procedure: ESOPHAGOGASTRODUODENOSCOPY (EGD) WITH PROPOFOL;  Surgeon: Regis Bill, MD;  Location: [facility];  Service: Endoscopy;  Laterality: N/A;  REQUESTS AM   HIP ARTHROPLASTY Right 12/11/2020   Procedure: ARTHROPLASTY BIPOLAR HIP (HEMIARTHROPLASTY);  Surgeon: Christena Flake, MD;  Location: [facility];  Service: Orthopedics;  Laterality: Right;   PARTIAL COLECTOMY  1996   ruptured abcess for diverticulitis   PARTIAL HYSTERECTOMY     uterine prolapse   right parotid     TONSILLECTOMY       reports that she has never smoked. She has never used smokeless tobacco. She reports that she does not drink alcohol and does not use drugs.  No Known Allergies  Family History  Problem Relation Age of Onset   Alzheimer's disease Mother    Diabetes Maternal Grandfather    Coronary artery disease Maternal Grandfather     Prior to Admission medications   Medication Sig Start Date End Date Taking? Authorizing Provider  aspirin 81 MG EC tablet Take 81 mg by mouth daily.   Yes [provider]  atorvastatin (LIPITOR) 80 MG tablet TAKE 1 TABLET BY MOUTH EVERY DAY 12/02/19  Yes Bedsole, Amy E, MD  calcium carbonate (OSCAL) 1500 (600 Ca) MG TABS tablet Take 1 tablet by mouth daily with breakfast.   Yes [provider]  clonazePAM (KLONOPIN) 0.5 MG tablet Take 0.5 mg by mouth 2 (two) times daily. 03/05/21  Yes [provider]  divalproex (DEPAKOTE) 125 MG DR tablet Take 125 mg by mouth 4 (four) times daily.   Yes [provider]  hydrOXYzine (ATARAX) 10 MG tablet Take 10 mg by mouth 2 (two) times daily as needed. 03/13/21  Yes [provider]  hyoscyamine (LEVBID) 0.375 MG 12 hr tablet Take 0.375 mg by mouth every 12 (twelve) hours as needed for cramping.   Yes [provider]  lisinopril (ZESTRIL) 5 MG tablet TAKE 1 TABLET BY MOUTH EVERY DAY 01/02/20  Yes Bedsole, Amy E, MD  Melatonin 10 MG TABS Take 30 mg by mouth at bedtime.   Yes [provider]  memantine (NAMENDA) 10 MG tablet Take 0.5 tablets (5 mg total) by mouth 2 (two) times daily. 10/15/19  Yes Bedsole, Amy E, MD  Multiple Vitamins-Minerals (CENTRUM SILVER PO) Take 1 tablet by mouth daily.   Yes [provider]  nitroGLYCERIN (NITROSTAT) 0.3 MG  SL tablet Place 1 tablet (0.3 mg total) under the tongue every 5 (five) minutes as needed for chest pain (call your doctor if pain persists after 3 doses). 05/28/20 05/28/21 Yes Shaune Pollack, MD  omeprazole (PRILOSEC) 20 MG capsule Take 40 mg by mouth daily with supper.   Yes [provider]  propranolol (INDERAL) 40 MG tablet TAKE 1 TABLET BY MOUTH TWICE A DAY Patient taking differently: Take 20 mg by mouth daily. 06/15/20  Yes Bedsole, Amy E, MD  risperiDONE (RISPERDAL) 0.5 MG tablet Take 0.5-1 mg by mouth See admin instructions. Take 1 tablet (0.5mg ) by mouth every morning, take 2 tablets (1mg ) by mouth every afternoon and take 1 tablet (0.5mg ) by mouth at bedtime   Yes [provider]  vitamin B-12 (CYANOCOBALAMIN) 1000 MCG tablet Take 1,000 mcg by mouth daily.   Yes [provider]  Vitamin D3 (VITAMIN D) 25 MCG tablet Take 1,000 Units by mouth daily.   Yes [provider]  vortioxetine HBr (TRINTELLIX) 20 MG TABS tablet Take 20 mg by mouth daily with supper.   Yes [provider]    Physical Exam: Vitals:   03/30/21 1500 03/30/21 1515 03/30/21 1530 03/30/21 1545  BP: 120/64 (!) 97/58 (!) 87/53 (!) 112/56  Pulse: 69 68 66 67  Resp: 19 14 14 18   Temp:      TempSrc:      SpO2: 98% 95% 95% 97%  Weight:        Constitutional: No acute distress, eyes closed does not respond Head: Atraumatic Eyes: Conjunctiva clear ENM: Moist mucous membranes. Normal dentition.  Neck: Supple Respiratory: Clear to auscultation bilaterally, no wheezing/rales/rhonchi. Normal respiratory effort. No accessory muscle use. . Cardiovascular: Regular rate and rhythm. No murmurs/rubs/gallops. Abdomen: Non-tender, non-distended. No masses. No rebound or guarding. Positive bowel sounds. Musculoskeletal: No joint deformity upper and lower extremities. Normal ROM, no contractures. Normal muscle tone.  Skin: No rashes, lesions, or ulcers.  Extremities: No peripheral edema.  Palpable peripheral pulses. Neurologic: asleep Psychiatric: unable to assess   Labs on Admission: I have personally reviewed following labs and imaging studies  CBC: Recent Labs  Lab 03/30/21 1139  WBC 8.6  NEUTROABS 6.6  HGB 12.6  HCT 39.6  MCV 96.8  PLT 184   Basic Metabolic Panel: Recent Labs  Lab 03/30/21 1139  NA 139  K 3.8  CL 103  CO2 26  GLUCOSE 78  BUN 32*  CREATININE 0.57  CALCIUM 9.3   GFR: Estimated Creatinine Clearance: 54.5 mL/min (by C-G formula based on SCr of 0.57 mg/dL). Liver Function Tests: Recent Labs  Lab 03/30/21 1139  AST 20  ALT 10  ALKPHOS 78  BILITOT 1.3*  PROT 6.3*  ALBUMIN 3.5   No results for input(s): LIPASE, AMYLASE in the last 168 hours. No  results for input(s): AMMONIA in the last 168 hours. Coagulation Profile: No results for input(s): INR, PROTIME in the last 168 hours. Cardiac Enzymes: No results for input(s): CKTOTAL, CKMB, CKMBINDEX, TROPONINI in the last 168 hours. BNP (last 3 results) No results for input(s): PROBNP in the last 8760 hours. HbA1C: No results for input(s): HGBA1C in the last 72 hours. CBG: Recent Labs  Lab 03/30/21 1137  GLUCAP 76   Lipid Profile: No results for input(s): CHOL, HDL, LDLCALC, TRIG, CHOLHDL, LDLDIRECT in the last 72 hours. Thyroid Function Tests: No results for input(s): TSH, T4TOTAL, FREET4, T3FREE, THYROIDAB in the last 72 hours. Anemia Panel: No results for input(s): VITAMINB12, FOLATE, FERRITIN, TIBC, IRON, RETICCTPCT in the last 72 hours. Urine analysis:    Component Value Date/Time   COLORURINE YELLOW 03/30/2021 1504   APPEARANCEUR CLEAR 03/30/2021 1504   LABSPEC >1.030 (H) 03/30/2021 1504   PHURINE 5.5 03/30/2021 1504   GLUCOSEU NEGATIVE 03/30/2021 1504   HGBUR NEGATIVE 03/30/2021 1504   BILIRUBINUR SMALL (A) 03/30/2021 1504   BILIRUBINUR Negative 03/20/2014 0930   KETONESUR >160 (A) 03/30/2021 1504   PROTEINUR NEGATIVE 03/30/2021 1504   UROBILINOGEN 0.2  03/20/2014 0930   UROBILINOGEN 1.0 07/31/2013 2053   NITRITE NEGATIVE 03/30/2021 1504   LEUKOCYTESUR NEGATIVE 03/30/2021 1504    Radiological Exams on Admission: CT HEAD WO CONTRAST ( )  Result Date: 03/30/2021 CLINICAL DATA:  Lethargy. EXAM: CT HEAD WITHOUT CONTRAST TECHNIQUE: Contiguous axial images were obtained from the base of the skull through the vertex without intravenous contrast. COMPARISON:  CT head dated December 11, 2020. FINDINGS: Brain: No evidence of acute infarction, hemorrhage, hydrocephalus, extra-axial collection or mass lesion/mass effect. Stable mild atrophy and chronic microvascular ischemic changes. Vascular: Calcified atherosclerosis at the skull base. No hyperdense vessel. Skull: Normal. Negative for fracture or focal lesion. Sinuses/Orbits: No acute finding. Other: None. IMPRESSION: 1. No acute intracranial abnormality. Electronically Signed   By: Obie Dredge M.D.   On: 03/30/2021 13:02   DG Chest Portable 1 View  Result Date: 03/30/2021 CLINICAL DATA:  Altered mental status, decreased appetite, confusion EXAM: PORTABLE CHEST 1 VIEW COMPARISON:  Portable exam 1211 hours compared 12/11/2020 FINDINGS: Borderline enlargement of cardiac silhouette. Atherosclerotic calcification aorta. Chronic peribronchial thickening with decreased RIGHT upper lobe opacity since prior exam. No acute infiltrate, pleural effusion or pneumothorax. Bones demineralized. IMPRESSION: Chronic bronchitic changes without acute infiltrate. Aortic Atherosclerosis (ICD10-I70.0). Electronically Signed   By: Ulyses Southward M.D.   On: 03/30/2021 12:30    EKG: Independently reviewed. nsr  Assessment/Plan Principal Problem:   Acute encephalopathy   # Acute encephalopathy Suspect progression of known advanced vascular dementia. Labs normal, no s/s infection, ct head non-acute - f/u mri to eval for occult stroke - f/u b12/tsh - continue IVF - swallow eval - will trial 3 days of high-dose thiamine as  is at risk for wernicke - f/u depakote level - hold home propranolol, risperdal, clonazepam, and depakote for now - f/u blood and urine cultures - advised husband that if w/u remains unrevealing and patient does not improve with fluids that this could represent new baseline - full code for now, advised husband that given advanced demential prognosis if resuscitated would be extremely poor  # Advanced vascular dementia - cont home namenda, trinellix - cont home aspirin, statin - pt/ot consults  # HTN Here borderline hypotensive - fluids as above - hold home lisinopril  # Frequent falls # Recent hip fracture - f/u x-ray of pelvis  DVT prophylaxis: lovenox Code Status: full confirmed w/ husband  Family Communication: husband updated @ bedside  Consults called: none   Level of care: Med-Surg Status is: Inpatient  Remains inpatient appropriate because: severity of illness        Silvano Bilis MD [facility] Pager [phone]  If 7PM-7AM, please contact night-coverage [email or web address] Password [email or web address]  03/30/2021, 4:21 PM

## 2021-03-30 NOTE — ED Notes (Signed)
Informed RN bed assigned 

## 2021-03-30 NOTE — ED Notes (Signed)
Patient transported to CT 

## 2021-03-30 NOTE — ED Triage Notes (Signed)
Pt to ED with husband from [facility] for decreased appetite, not acting self. Denies fevers. Spouse states she is normally confused but is more confused that usual.  Pt responds to painful stimuli only. Does not open eyes, does not speak, spouse states not normal.  Pt moans to sternal rub.  Husband reports last normal Sunday night.   Cbg 76

## 2021-03-31 ENCOUNTER — Encounter: Payer: Self-pay | Admitting: Obstetrics and Gynecology

## 2021-03-31 DIAGNOSIS — F01B0 Vascular dementia, moderate, without behavioral disturbance, psychotic disturbance, mood disturbance, and anxiety: Secondary | ICD-10-CM

## 2021-03-31 DIAGNOSIS — Z515 Encounter for palliative care: Secondary | ICD-10-CM

## 2021-03-31 DIAGNOSIS — Z7189 Other specified counseling: Secondary | ICD-10-CM

## 2021-03-31 LAB — CBC
HCT: 32.2 % — ABNORMAL LOW (ref 36.0–46.0)
Hemoglobin: 10.8 g/dL — ABNORMAL LOW (ref 12.0–15.0)
MCH: 31.9 pg (ref 26.0–34.0)
MCHC: 33.5 g/dL (ref 30.0–36.0)
MCV: 95 fL (ref 80.0–100.0)
Platelets: 150 10*3/uL (ref 150–400)
RBC: 3.39 MIL/uL — ABNORMAL LOW (ref 3.87–5.11)
RDW: 13.1 % (ref 11.5–15.5)
WBC: 5.6 10*3/uL (ref 4.0–10.5)
nRBC: 0 % (ref 0.0–0.2)

## 2021-03-31 LAB — BASIC METABOLIC PANEL
Anion gap: 7 (ref 5–15)
BUN: 24 mg/dL — ABNORMAL HIGH (ref 8–23)
CO2: 24 mmol/L (ref 22–32)
Calcium: 8.7 mg/dL — ABNORMAL LOW (ref 8.9–10.3)
Chloride: 109 mmol/L (ref 98–111)
Creatinine, Ser: 0.49 mg/dL (ref 0.44–1.00)
GFR, Estimated: >60 mL/min (ref >60)
Glucose, Bld: 66 mg/dL — ABNORMAL LOW (ref 70–99)
Potassium: 3.5 mmol/L (ref 3.5–5.1)
Sodium: 140 mmol/L (ref 135–145)

## 2021-03-31 LAB — URINE CULTURE: Culture: NO GROWTH

## 2021-03-31 LAB — GLUCOSE, CAPILLARY: Glucose-Capillary: 110 mg/dL — ABNORMAL HIGH (ref 70–99)

## 2021-03-31 MED ORDER — RISPERIDONE 0.5 MG PO TABS: 0.5000 mg | ORAL_TABLET | ORAL | Status: DC

## 2021-03-31 MED ORDER — RISPERIDONE 0.5 MG PO TABS
0.5000 mg | ORAL_TABLET | Freq: Every day | ORAL | Status: DC
  Administered 2021-04-01: 0.5 mg via ORAL
  Filled 2021-03-31 (×2): qty 1

## 2021-03-31 MED ORDER — CLONAZEPAM 0.5 MG PO TABS
0.5000 mg | ORAL_TABLET | Freq: Two times a day (BID) | ORAL | Status: DC
  Administered 2021-03-31 – 2021-04-01 (×2): 0.5 mg via ORAL
  Filled 2021-03-31 (×2): qty 1

## 2021-03-31 MED ORDER — RISPERIDONE 1 MG PO TABS
1.0000 mg | ORAL_TABLET | Freq: Every day | ORAL | Status: DC
  Filled 2021-03-31: qty 1

## 2021-03-31 MED ORDER — DIVALPROEX SODIUM 125 MG PO DR TAB
125.0000 mg | DELAYED_RELEASE_TABLET | Freq: Four times a day (QID) | ORAL | Status: DC
  Administered 2021-03-31 – 2021-04-01 (×2): 125 mg via ORAL
  Filled 2021-03-31 (×5): qty 1

## 2021-03-31 MED ORDER — FAMOTIDINE IN NACL 20-0.9 MG/50ML-% IV SOLN
20.0000 mg | Freq: Two times a day (BID) | INTRAVENOUS | Status: DC
  Administered 2021-03-31 – 2021-04-01 (×3): 20 mg via INTRAVENOUS
  Filled 2021-03-31 (×4): qty 50

## 2021-03-31 MED ORDER — RISPERIDONE 0.5 MG PO TABS
0.5000 mg | ORAL_TABLET | Freq: Every day | ORAL | Status: DC
  Administered 2021-03-31: 21:00:00 0.5 mg via ORAL
  Filled 2021-03-31 (×2): qty 1

## 2021-03-31 NOTE — Evaluation (Signed)
Occupational Therapy Evaluation Patient Details Name: Crystal Newton MRN: [id number] DOB: 20-May-1945 Today's Date: 03/31/2021   History of Present Illness Crystal Newton is a 75 y.o. female with a history of diabetes, hypertension, anxiety, depression, hyperlipidemia,  prior strokes, advanced dementia who is brought to the ED due to decreased level of alertness, worsening confusion, not eating or drinking for the last 2 days.   Clinical Impression   Crystal Newton was seen for OT evaluation this date. Prior to hospital admission, Crystal Newton needed assist in all ADLs from spouse. Spouse assists for wheelchair and shower t/fs. Crystal Newton lives with spouse, has adult children next door. Currently Crystal Newton demonstrates impairments as described below (See OT problem list) which functionally limit her ability to perform ADL/self-care tasks. Crystal Newton currently requires MIN A for functional mobility, STS, ~3 min, and attempt to ambulate along EOB but ultimately Crystal Newton unable to step forward or laterally. Crystal Newton fearful and unable to sequence steps. Anticipate Crystal Newton MAX A for LB dressing, Crystal Newton w/ LB rigidity. Crystal Newton would benefit from skilled OT services to address noted impairments and functional limitations (see below for any additional details) in order to maximize safety and independence while minimizing falls risk and caregiver burden. Upon hospital discharge, recommend HHOT with 24/7 supervision to maximize Crystal Newton safety and return to functional independence during meaningful occupations of daily life.     Recommendations for follow up therapy are one component of a multi-disciplinary discharge planning process, led by the attending physician.  Recommendations may be updated based on patient status, additional functional criteria and insurance authorization.   Follow Up Recommendations  Home health OT    Assistance Recommended at Discharge Frequent or constant Supervision/Assistance  Functional Status Assessment  Patient has had a recent decline in their  functional status and/or demonstrates limited ability to make significant improvements in function in a reasonable and predictable amount of time  Equipment Recommendations  Hospital bed    Recommendations for Other Services       Precautions / Restrictions Precautions Precautions: Fall Restrictions Weight Bearing Restrictions: No      Mobility Bed Mobility Overal bed mobility: Needs Assistance Bed Mobility: Supine to Sit;Sit to Supine     Supine to sit: Min assist Sit to supine: Min assist   General bed mobility comments: Crystal Newton in very rigid lying position on return to bed    Transfers Overall transfer level: Needs assistance Equipment used: 1 person hand held assist Transfers: Sit to/from Stand Sit to Stand: Min assist           General transfer comment: Crystal Newton very fearful      Balance Overall balance assessment: Needs assistance;History of Falls Sitting-balance support: Bilateral upper extremity supported;Feet supported Sitting balance-Leahy Scale: Fair     Standing balance support: Single extremity supported;During functional activity Standing balance-Leahy Scale: Poor                             ADL either performed or assessed with clinical judgement   ADL Overall ADL's : Needs assistance/impaired                                       General ADL Comments: Crystal Newton MIN A for functional mobility, STS and attempt to ambulate along EOB.      Pertinent Vitals/Pain Pain Assessment: Faces Faces Pain Scale: Hurts a little bit Pain  Location: back of legs Pain Descriptors / Indicators: Discomfort Pain Intervention(s): Monitored during session     Hand Dominance     Extremity/Trunk Assessment Upper Extremity Assessment Upper Extremity Assessment: Overall WFL for tasks assessed   Lower Extremity Assessment Lower Extremity Assessment: RLE deficits/detail RLE Deficits / Details: previous right hip unipolar hemiarthroplasty (d/c  12/15/2020)       Communication Communication Communication: No difficulties   Cognition Arousal/Alertness: Awake/alert Behavior During Therapy: Anxious;Flat affect Overall Cognitive Status: History of cognitive impairments - at baseline                                 General Comments: Crystal Newton A& O x name and place, but not time and situation     General Comments       Exercises Exercises: Other exercises Other Exercises Other Exercises: Crystal Newton and family educ re: OT role, d/c recs, falls prevention Other Exercises: Sup<>sit, sit<>stand, forward steps and lateral steps attempted   Shoulder Instructions      Home Living Family/patient expects to be discharged to:: Private residence Living Arrangements: Spouse/significant other Available Help at Discharge: Family;Available 24 hours/day Type of Home: House Home Access: Stairs to enter [employer] of Steps: 3                   Home Equipment: BSC/3in1;Shower seat;Wheelchair - manual   Additional Comments: Husband available 24/7. PRN assist from children/grandchildren      Prior Functioning/Environment Prior Level of Function : Needs assist       Physical Assist : Mobility (physical);ADLs (physical) Mobility (physical): Bed mobility;Transfers   Mobility Comments: husband transfers Crystal Newton to/from w/c with shufling steps. Uses w/c for mobility including down steps (husband + extra assist) ADLs Comments: Husband assists for ADLs - completes toileting in standing using briefs. Crystal Newton self-feeds.        OT Problem List: Decreased strength;Decreased range of motion;Decreased activity tolerance;Impaired balance (sitting and/or standing);Decreased coordination;Decreased cognition;Decreased safety awareness;Decreased knowledge of use of DME or AE;Decreased knowledge of precautions      OT Treatment/Interventions: Self-care/ADL training;Therapeutic exercise;DME and/or AE instruction;Therapeutic activities     OT Goals(Current goals can be found in the care plan section) Acute Rehab OT Goals Patient Stated Goal: to stop decline OT Goal Formulation: With family Time For Goal Achievement: 04/14/21 Potential to Achieve Goals: Fair ADL Goals Crystal Newton Will Perform Grooming: sitting;with set-up;with supervision Crystal Newton Will Perform Lower Body Dressing: sitting/lateral leans;with min assist Crystal Newton Will Transfer to Toilet: bedside commode;with min assist;squat pivot transfer  OT Frequency: Min 2X/week   Barriers to D/C:            Co-evaluation              AM-PAC OT "6 Clicks" Daily Activity     Outcome Measure Help from another person eating meals?: A Little Help from another person taking care of personal grooming?: A Lot Help from another person toileting, which includes using toliet, bedpan, or urinal?: A Lot Help from another person bathing (including washing, rinsing, drying)?: A Lot Help from another person to put on and taking off regular upper body clothing?: A Lot Help from another person to put on and taking off regular lower body clothing?: A Lot 6 Click Score: 13   End of Session Nurse Communication: Mobility status  Activity Tolerance: Patient tolerated treatment well;Other (comment) (Crystal Newton limited by fear) Patient left: in chair;with bed alarm set;with nursing/sitter in  room;with family/visitor present  OT Visit Diagnosis: Unsteadiness on feet (R26.81);Other abnormalities of gait and mobility (R26.89);Repeated falls (R29.6);History of falling (Z91.81);Muscle weakness (generalized) (M62.81)                Time: 0539-7673 OT Time Calculation (min): 34 min Charges:  OT General Charges $OT Visit: 1 Visit OT Evaluation $OT Eval Moderate Complexity: 1 Mod OT Treatments $Self Care/Home Management : 8-22 mins  [place] [facility], Adine Madura 03/31/2021, 1:42 PM

## 2021-03-31 NOTE — Evaluation (Signed)
Clinical/Bedside Swallow Evaluation Patient Details  Name: Crystal Newton MRN: [id number] Date of Birth: 13-May-1945  Today's Date: 03/31/2021 Time: SLP Start Time (ACUTE ONLY): 3419 SLP Stop Time (ACUTE ONLY): 0935 SLP Time Calculation (min) (ACUTE ONLY): 60 min  Past Medical History:  Past Medical History:  Diagnosis Date   Anxiety    Arthritis    Depression    Diabetes mellitus    diet controlled   Headache    migraine headache   History of kidney stones    Hyperlipidemia    Hypertension    Memory difficulties    Sleep disorder    Stroke (HCC)    TIA (transient ischemic attack)    found on MRI.   Past Surgical History:  Past Surgical History:  Procedure Laterality Date   ABDOMINAL HYSTERECTOMY     BREAST BIOPSY     CATARACT EXTRACTION W/PHACO Right 10/17/2019   Procedure: CATARACT EXTRACTION PHACO AND INTRAOCULAR LENS PLACEMENT (IOC) RIGHT DIABETIC VISION BLUE 9.96  00:59.0;  Surgeon: Elliot Cousin, MD;  Location: [facility];  Service: Ophthalmology;  Laterality: Right;  Diabetic - diet controlled   DILATION AND CURETTAGE OF UTERUS     ESOPHAGOGASTRODUODENOSCOPY (EGD) WITH PROPOFOL N/A 09/21/2020   Procedure: ESOPHAGOGASTRODUODENOSCOPY (EGD) WITH PROPOFOL;  Surgeon: Regis Bill, MD;  Location: [facility];  Service: Endoscopy;  Laterality: N/A;  REQUESTS AM   HIP ARTHROPLASTY Right 12/11/2020   Procedure: ARTHROPLASTY BIPOLAR HIP (HEMIARTHROPLASTY);  Surgeon: Christena Flake, MD;  Location: [facility];  Service: Orthopedics;  Laterality: Right;   PARTIAL COLECTOMY  1996   ruptured abcess for diverticulitis   PARTIAL HYSTERECTOMY     uterine prolapse   right parotid     TONSILLECTOMY     HPI:  Pt is a 75 y.o. female  with past medical history of advanced vascular Dementia, right hip arthroplasty August 2022 w/ trial of Rehab then but a "steady decline since then, per Husband", HTN/HLD, CVA/TIA, OSA, DM, depression, partial colectomy 1996, partial hysterectomy  with uterine prolapse EGD June 2022.  Pt was admitted on 03/30/2021 with acute encephalopathy, suspect progression of known advanced vascular dementia, work-up in progress.  Pt has not been eating nor drinking for ~2-3 days per report.  Family assists w/ feeding pt and encouring oral intake at home baseline.  CXR: Chronic bronchitic changes without acute infiltrate. Aortic Atherosclerosis.  MRI: no acute abnormality/infarct.  Pt's Husband stated that d/t the "steady decline", they were to have their first in-home Palliative Care meeting 04/01/21 for GOC discussion.    Assessment / Plan / Recommendation  Clinical Impression  Pt appears to present w/ Min oral phase dysphagia in light of declined Cognitive status; Baseline Dementia. This can impact her overall awareness/timing of swallow and safety during po tasks which increases risk for aspiration, choking. Pt's risk for aspiration is present but can be reduced when following general aspiration precautions and using a modified diet consistency of Minced, borken down foods for ease of oral phase attention/swallowing.   She required min-mod verbal/visual/tactile cues for follow through during po tasks. Pt consumed all trials consistencies w/ NO overt clinical s/s of aspiration noted: No decline in vocal quality; no cough, and no decline in respiratory status during/post trials. Oral phase was adequate for bolus management and oral clearing of the liquid and puree boluses given. Mastication of soft solids required min more Time but was overall adequate w/ the moist, broken down trials. Pt self-feeding by [employer] when drinking which  increases her safety w/ oral intake, but required min+ support w/ feeding of foods(utensil use as a Cognitive task). OM Exam appeared [facility] w/ No unilateral weakness noted. Some confusion of OM tasks and oral care.         D/t pt's Baseline, declined Cognitive status(Dementia). and her risk for aspiration, recommend initiation of the  dysphagia level 2(Minced foods for ease) w/ Thin liquids via Cup; general aspiration precautions; reduce Distractions during meals and engage pt during po's at meal for self-feeding. Pills Whole vs Crushed in Puree for safer swallowing. Support w/ feeding at meals as needed. MD/NSG updated. ST services recommends follow w/ Palliative Care for GOC and education re: impact of Cognitive decline/Dementia on swallowing. ST services can be available for any further education if needed while admitted. Pt appears at her Baseline from a swallowing/dysphagia standpoint. Largely suspect that pt's Dementia could hamper upgrade of diet further d/t effort involved. Precautions posted in room; handouts given. Education w/ Family on benefits of dysphagia diet; impact of Cognitive decline/Dementia on swallowing; aspiration precautions. All agreed. SLP Visit Diagnosis: Dysphagia, oral phase (R13.11) (Dementia baseline)    Aspiration Risk  Mild aspiration risk;Risk for inadequate nutrition/hydration (reduced following precautions)    Diet Recommendation   dysphagia level 2(Minced foods for ease) w/ Thin liquids via Cup; general aspiration precautions; reduce Distractions during meals and engage pt during po's at meal for self-feeding. Support w/ feeding at meals as needed.   Medication Administration: Crushed with puree    Other  Recommendations Recommended Consults:  (Palliative Care f/u) Oral Care Recommendations: Oral care BID;Oral care before and after PO;Staff/trained caregiver to provide oral care Other Recommendations:  (n/a)    Recommendations for follow up therapy are one component of a multi-disciplinary discharge planning process, led by the attending physician.  Recommendations may be updated based on patient status, additional functional criteria and insurance authorization.  Follow up Recommendations No SLP follow up      Assistance Recommended at Discharge Frequent or constant Supervision/Assistance   Functional Status Assessment Patient has had a recent decline in their functional status and/or demonstrates limited ability to make significant improvements in function in a reasonable and predictable amount of time  Frequency and Duration  (n/a)   (n/a)       Prognosis Prognosis for Safe Diet Advancement: Fair Barriers to Reach Goals: Cognitive deficits;Language deficits;Time post onset;Severity of deficits;Behavior      Swallow Study   General Date of Onset: 03/30/21 HPI: Pt is a 75 y.o. female  with past medical history of advanced vascular Dementia, right hip arthroplasty August 2022 w/ trial of Rehab then but a "steady decline since then, per Husband", HTN/HLD, CVA/TIA, OSA, DM, depression, partial colectomy 1996, partial hysterectomy with uterine prolapse EGD June 2022.  Pt was admitted on 03/30/2021 with acute encephalopathy, suspect progression of known advanced vascular dementia, work-up in progress.  Pt has not been eating nor drinking for ~2-3 days per report.  Family assists w/ feeding pt and encouring oral intake at home baseline.  CXR: Chronic bronchitic changes without acute infiltrate. Aortic Atherosclerosis.  MRI: no acute abnormality/infarct.  Pt's Husband stated that d/t the "steady decline", they were to have their first in-home Palliative Care meeting 04/01/21 for GOC discussion. Type of Study: Bedside Swallow Evaluation Previous Swallow Assessment: none Diet Prior to this Study: Dysphagia 1 (puree);Thin liquids Temperature Spikes Noted: No (wbc 5.6) Respiratory Status: Room air History of Recent Intubation: No Behavior/Cognition: Alert;Cooperative;Pleasant mood;Confused;Distractible;Requires cueing Oral Cavity Assessment: Dry  Oral Care Completed by SLP: Yes Oral Cavity - Dentition: Adequate natural dentition Vision: Functional for self-feeding Self-Feeding Abilities: Able to feed self;Needs assist;Needs set up;Total assist (support) Patient Positioning: Upright in  bed (needed full positioning support d/t Cognition) Baseline Vocal Quality: Low vocal intensity Volitional Cough: Cognitively unable to elicit Volitional Swallow: Unable to elicit    Oral/Motor/Sensory Function Overall Oral Motor/Sensory Function: Within functional limits (w/ bolus management and oral clearing)   Ice Chips Ice chips: Within functional limits Presentation: Spoon (fed; 3 trials)   Thin Liquid Thin Liquid: Within functional limits Presentation: Cup;Self Fed (~6 ozs total) Other Comments: water, juice    Nectar Thick Nectar Thick Liquid: Not tested   Honey Thick Honey Thick Liquid: Not tested   Puree Puree: Within functional limits Presentation: Spoon (fed; support. 9 trials)   Solid     Solid: Impaired Presentation: Spoon (fed; 6 trials) Oral Phase Impairments:  (slower oral phase/mastication) Oral Phase Functional Implications: Prolonged oral transit (min) Pharyngeal Phase Impairments:  (none)        Jerilynn Som, MS, Science writer Language Pathologist Rehab Services [phone] [facility] 03/31/2021,5:22 PM

## 2021-03-31 NOTE — Evaluation (Signed)
Physical Therapy Evaluation Patient Details Name: Crystal Newton MRN: [id number] DOB: May 31, 1945 Today's Date: 03/31/2021  History of Present Illness  Crystal Newton is a 75 y.o. female with a history of diabetes, hypertension, anxiety, depression, hyperlipidemia,  prior strokes, advanced dementia who is brought to the ED due to decreased level of alertness, worsening confusion, not eating or drinking for the last 2 days.  Clinical Impression  Pt is a pleasant 75 year old female who was admitted for acute encephalopathy. Pt performs bed mobility/transfers with min assist and ambulation with min/mod assist and HHA. Unable to coordinate ambulation. Recommended to family that [facility] transfers are recommended at this time due to safety. Pt demonstrates deficits with strength/mobility/cognition. Would benefit from trial run of skilled PT to address above deficits and promote optimal return to PLOF.Marland Kitchen At this time, SNF is not recommended due to limited carry over and recall of therapeutic activities. Pt very emotional this date and is very concerned about her husband not being here, however was confused on where "here" is. Anticipate being in a familiar environment will be more beneficial and comfortable for her and family.     Recommendations for follow up therapy are one component of a multi-disciplinary discharge planning process, led by the attending physician.  Recommendations may be updated based on patient status, additional functional criteria and insurance authorization.  Follow Up Recommendations  (HHPT vs hospice)    Assistance Recommended at Discharge Frequent or constant Supervision/Assistance  Functional Status Assessment Patient has had a recent decline in their functional status and/or demonstrates limited ability to make significant improvements in function in a reasonable and predictable amount of time  Equipment Recommendations  Hospital bed    Recommendations for Other Services        Precautions / Restrictions Precautions Precautions: Fall Restrictions Weight Bearing Restrictions: No      Mobility  Bed Mobility Overal bed mobility: Needs Assistance Bed Mobility: Supine to Sit;Sit to Supine     Supine to sit: Min assist Sit to supine: Min assist   General bed mobility comments: follows commands, however frequently forgetful of task. Once seated, pt able to maintain upright posture with supervision    Transfers Overall transfer level: Needs assistance Equipment used: 1 person hand held assist Transfers: Sit to/from Stand Sit to Stand: Min assist           General transfer comment: forward flexed posture with fear of movement    Ambulation/Gait Ambulation/Gait assistance: Min assist;Mod assist Gait Distance (Feet): 3 Feet Assistive device: 1 person hand held assist Gait Pattern/deviations: Step-to pattern       General Gait Details: able to take side steps up towards the top of bed, however takes increased encouragement to participate due to fear  Stairs            Wheelchair Mobility    Modified Rankin (Stroke Patients Only)       Balance Overall balance assessment: Needs assistance;History of Falls Sitting-balance support: Bilateral upper extremity supported;Feet supported Sitting balance-Leahy Scale: Fair     Standing balance support: Single extremity supported;During functional activity Standing balance-Leahy Scale: Poor                               Pertinent Vitals/Pain Pain Assessment: No/denies pain    Home Living Family/patient expects to be discharged to:: Private residence Living Arrangements: Spouse/significant other Available Help at Discharge: Family;Available 24 hours/day Type of Home: House  Home Access: Stairs to enter   Entrance Stairs-Number of Steps: 1   Home Layout: One level Home Equipment: BSC/3in1;Shower seat;Wheelchair - manual Additional Comments: Husband available 24/7. PRN  assist from children/grandchildren    Prior Function Prior Level of Function : Needs assist       Physical Assist : Mobility (physical);ADLs (physical) Mobility (physical): Bed mobility;Transfers   Mobility Comments: husband transfers pt to/from w/c with shufling steps. Uses w/c for mobility including down steps (husband + extra assist). Pt frequently forgets she requires assist, therefore 5 falls this week from patient getting up unassisted ADLs Comments: Husband assists for ADLs - completes toileting in standing using briefs. Pt self-feeds.     Hand Dominance        Extremity/Trunk Assessment   Upper Extremity Assessment Upper Extremity Assessment: Overall WFL for tasks assessed    Lower Extremity Assessment Lower Extremity Assessment: Generalized weakness (B LE grossly 4/5)       Communication   Communication: No difficulties  Cognition Arousal/Alertness: Awake/alert Behavior During Therapy:  (emotional) Overall Cognitive Status: History of cognitive impairments - at baseline                                 General Comments: Pt very emotional upon arrival, worried about her huband who had left to go home. DIdn't recognize family in room.        General Comments      Exercises Other Exercises Other Exercises: educated on benefit of PT and functional decline   Assessment/Plan    PT Assessment Patient needs continued PT services  PT Problem List Decreased strength;Decreased balance;Decreased mobility;Decreased cognition;Decreased knowledge of use of DME;Decreased safety awareness       PT Treatment Interventions      PT Goals (Current goals can be found in the Care Plan section)  Acute Rehab PT Goals Patient Stated Goal: unable to state PT Goal Formulation: With patient Time For Goal Achievement: 04/14/21 Potential to Achieve Goals: Fair    Frequency  (on 3 day trial PT)   Barriers to discharge        Co-evaluation                AM-PAC PT "6 Clicks" Mobility  Outcome Measure Help needed turning from your back to your side while in a flat bed without using bedrails?: A Little Help needed moving from lying on your back to sitting on the side of a flat bed without using bedrails?: A Little Help needed moving to and from a bed to a chair (including a wheelchair)?: A Lot Help needed standing up from a chair using your arms (e.g., wheelchair or bedside chair)?: A Lot Help needed to walk in hospital room?: Total Help needed climbing 3-5 steps with a railing? : Total 6 Click Score: 12    End of Session Equipment Utilized During Treatment: Gait belt Activity Tolerance: Patient tolerated treatment well Patient left: in bed;with bed alarm set;with family/visitor present Nurse Communication: Mobility status PT Visit Diagnosis: Unsteadiness on feet (R26.81);Repeated falls (R29.6);Muscle weakness (generalized) (M62.81);History of falling (Z91.81);Difficulty in walking, not elsewhere classified (R26.2)    Time: 0165-5374 PT Time Calculation (min) (ACUTE ONLY): 29 min   Charges:   PT Evaluation $PT Eval Low Complexity: 1 Low PT Treatments $Therapeutic Activity: 8-22 mins        Crystal Newton, PT, DPT [phone]   Shaundrea Carrigg 03/31/2021, 4:33 PM

## 2021-03-31 NOTE — Progress Notes (Signed)
PROGRESS NOTE    Crystal Newton  VWU:[id number] DOB: [birth year] DOA: 03/30/2021 PCP: Alm Bustard, NP    Brief Narrative:  75 year old female with history of advanced dementia, hypertension, history of a stroke brought from home with about 2 days of not responding as usual.  Patient is mostly bedbound.  She is able to participate in feeding and self-care.  She has dementia for about 10 years now, followed by neurology.  Her dementia has been rapidly declining since she developed COVID last year as well as after she suffered a hip fracture.  Lives at home with husband and no oral intake for the last 2 days.  Went to [facility] and was directed to the ER.  Investigations essentially normal.   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 Symptomatology consistent with advanced dementia with failure to thrive. Will continue symptomatic treatment with maintenance IV fluids, trial of high-dose thiamine. Unable to take oral medications, propanolol, Risperdal, clonazepam and Depakote on hold.  Symptomatic treatment for delirium and agitation through injectables. Speech therapy consult for swallow evaluation and Mini-Mental state exam. Physical therapy and Occupational Therapy. Palliative care consultation to discuss palliation and possible hospice.  Essential hypertension: Blood pressures low normal.  Holding all antihypertensives.   DVT prophylaxis: enoxaparin (LOVENOX) injection 40 mg Start: 03/30/21 2200   Code Status: Full code. Family Communication: Husband at the bedside Disposition Plan: Status is: Inpatient  Remains inpatient appropriate because: Altered mental status         Consultants:  Palliative care  Procedures:   None  Antimicrobials:  None   Subjective: Patient seen and examined.  She was very lethargic and mostly staying with eyes closed.  On further questioning, she was able to answer basic questions.  She answered her name, her date of birth and her children's name.  She knows she is in the hospital.  Speaks in 1-2 words.  Husband at the bedside who is aware about progressive dementia symptoms and at least symptoms been worse since last hip fracture and recent COVID-19 infection. Patient was seen by her neurologist last week and were scheduled to have palliative care home visit tomorrow.  Objective: Vitals:   03/30/21 1938 03/30/21 2252 03/31/21 0748 03/31/21 1145  BP: 112/67 125/66 130/75 (!) 121/59  Pulse: 66 77 73 67  Resp: Temp: (!) 97.5 F (36.4 C) 97.7 F (36.5 C) 97.8 F (36.6 C) 98 F (36.7 C)  TempSrc:  Oral Oral Oral  SpO2: 94% 95% 96% 95%  Weight:      Height:        Intake/Output Summary (Last 24 hours) at 03/31/2021 1222 Last data filed at 03/31/2021 0630 Gross per 24 hour  Intake 1070 ml  Output 550 ml  Net 520 ml   Filed Weights   03/30/21 1139 03/30/21 1737  Weight: 63.5 kg 57.2 kg    Examination:  General exam: Appears calm and comfortable at rest. Flat affect.  Mostly sleepy and minimally interactive.  Frail and debilitated. Respiratory system: Clear to auscultation. Respiratory effort normal.  No added sounds. Cardiovascular system: S1 & S2 heard, RRR. No JVD, murmurs, rubs, gallops or clicks. No pedal edema. Gastrointestinal system: Abdomen is nonLEILANNY FLUITT, soft and mildly tender along the left lower quadrant. Central nervous  system: Sleepy.  Alert to stimulation and answers basic questions.  Moves all extremities and follows simple commands.  Oriented x1.  Not oriented to time and place. Extremities: Symmetric 5 x 5 power.    Data Reviewed: I have personally reviewed following labs and imaging studies  CBC: Recent Labs  Lab  03/30/21 1139 03/31/21 0531  WBC 8.6 5.6  NEUTROABS 6.6  --   HGB 12.6 10.8*  HCT 39.6 32.2*  MCV 96.8 95.0  PLT 184 150   Basic Metabolic Panel: Recent Labs  Lab 03/30/21 1139 03/31/21 0531  NA 139 140  K 3.8 3.5  CL 103 109  CO2 26 24  GLUCOSE 78 66*  BUN 32* 24*  CREATININE 0.57 0.49  CALCIUM 9.3 8.7*   GFR: Estimated Creatinine Clearance: 48.2 mL/min (by C-G formula based on SCr of 0.49 mg/dL). Liver Function Tests: Recent Labs  Lab 03/30/21 1139  AST 20  ALT 10  ALKPHOS 78  BILITOT 1.3*  PROT 6.3*  ALBUMIN 3.5   No results for input(s): LIPASE, AMYLASE in the last 168 hours. No results for input(s): AMMONIA in the last 168 hours. Coagulation Profile: No results for input(s): INR, PROTIME in the last 168 hours. Cardiac Enzymes: No results for input(s): CKTOTAL, CKMB, CKMBINDEX, TROPONINI in the last 168 hours. BNP (last 3 results) No results for input(s): PROBNP in the last 8760 hours. HbA1C: No results for input(s): HGBA1C in the last 72 hours. CBG: Recent Labs  Lab 03/30/21 1137  GLUCAP 76   Lipid Profile: No results for input(s): CHOL, HDL, LDLCALC, TRIG, CHOLHDL, LDLDIRECT in the last 72 hours. Thyroid Function Tests: Recent Labs    03/30/21 1139  TSH 0.405   Anemia Panel: Recent Labs    03/30/21 1139  VITAMINB12 2,701*   Sepsis Labs: Recent Labs  Lab 03/30/21 1139  LATICACIDVEN 0.9    Recent Results (from the past 240 hour(s))  Culture, blood (routine x 2)     Status: None (Preliminary result)   Collection Time: 03/30/21 12:10 PM   Specimen: BLOOD  Result Value Ref Range Status   Specimen Description BLOOD LEFT FA  Final   Special Requests   Final    BOTTLES DRAWN AEROBIC AND ANAEROBIC Blood Culture adequate volume   Culture   Final    NO GROWTH < 24 HOURS Performed at [facility], [address] [place], [place] [address]    Report Status PENDING  Incomplete  Culture, blood (routine x 2)     Status: None  (Preliminary result)   Collection Time: 03/30/21 12:12 PM   Specimen: BLOOD  Result Value Ref Range Status   Specimen Description BLOOD RIGHT [facility]  Final   Special Requests   Final    BOTTLES DRAWN AEROBIC AND ANAEROBIC Blood Culture adequate volume   Culture   Final    NO GROWTH < 24 HOURS Performed at [facility], [address], [place], [place] [address]    Report Status PENDING  Incomplete  Resp Panel by RT-PCR (Flu A&B, Covid) Nasopharyngeal Swab     Status: None   Collection Time: 03/30/21  3:16 PM   Specimen: Nasopharyngeal Swab; Nasopharyngeal(NP) swabs in vial transport medium  Result Value Ref Range Status   SARS Coronavirus 2 by RT PCR NEGATIVE NEGATIVE Final    Comment: (NOTE) SARS-CoV-2 target nucleic acids are NOT DETECTED.  The SARS-CoV-2 RNA is generally detectable in upper respiratory specimens during the acute phase of infection. The lowest concentration  of SARS-CoV-2 viral copies this assay can detect is 138 copies/mL. A negative result does not preclude SARS-Cov-2 infection and should not be used as the sole basis for treatment or other patient management decisions. A negative result may occur with  improper specimen collection/handling, submission of specimen other than nasopharyngeal swab, presence of viral mutation(s) within the areas targeted by this assay, and inadequate number of viral copies(<138 copies/mL). A negative result must be combined with clinical observations, patient history, and epidemiological information. The expected result is Negative.  Fact Sheet for Patients:  BloggerCourse.com  Fact Sheet for Healthcare Providers:  SeriousBroker.it  This test is no t yet approved or cleared by the [place] FDA and  has been authorized for detection and/or diagnosis of SARS-CoV-2 by FDA under an Emergency Use Authorization (EUA). This EUA will remain  in effect (meaning this test can  be used) for the duration of the COVID-19 declaration under Section 564(b)(1) of the Act, 21 U.S.C.section 360bbb-3(b)(1), unless the authorization is terminated  or revoked sooner.       Influenza A by PCR NEGATIVE NEGATIVE Final   Influenza B by PCR NEGATIVE NEGATIVE Final    Comment: (NOTE) The Xpert Xpress SARS-CoV-2/FLU/RSV plus assay is intended as an aid in the diagnosis of influenza from Nasopharyngeal swab specimens and should not be used as a sole basis for treatment. Nasal washings and aspirates are unacceptable for Xpert Xpress SARS-CoV-2/FLU/RSV testing.  Fact Sheet for Patients: BloggerCourse.com  Fact Sheet for Healthcare Providers: SeriousBroker.it  This test is not yet approved or cleared by the [place] FDA and has been authorized for detection and/or diagnosis of SARS-CoV-2 by FDA under an Emergency Use Authorization (EUA). This EUA will remain in effect (meaning this test can be used) for the duration of the COVID-19 declaration under Section 564(b)(1) of the Act, 21 U.S.C. section 360bbb-3(b)(1), unless the authorization is terminated or revoked.  Performed at [facility], [address] [place], [place] [address]          Radiology Studies: DG Pelvis 1-2 Views  Result Date: 03/30/2021 CLINICAL DATA:  Fall, bilateral hip pain EXAM: PELVIS - 1-2 VIEW COMPARISON:  None. FINDINGS: Prior right hip replacement. No hardware complicating feature. No acute bony abnormality. Specifically, no fracture, subluxation, or dislocation. SI joints symmetric and unremarkable. IMPRESSION: No acute bony abnormality. Electronically Signed   By: Charlett Nose M.D.   On: 03/30/2021 17:15   CT HEAD WO CONTRAST ( )  Result Date: 03/30/2021 CLINICAL DATA:  Lethargy. EXAM: CT HEAD WITHOUT CONTRAST TECHNIQUE: Contiguous axial images were obtained from the base of the skull through the vertex without  intravenous contrast. COMPARISON:  CT head dated December 11, 2020. FINDINGS: Brain: No evidence of acute infarction, hemorrhage, hydrocephalus, extra-axial collection or mass lesion/mass effect. Stable mild atrophy and chronic microvascular ischemic changes. Vascular: Calcified atherosclerosis at the skull base. No hyperdense vessel. Skull: Normal. Negative for fracture or focal lesion. Sinuses/Orbits: No acute finding. Other: None. IMPRESSION: 1. No acute intracranial abnormality. Electronically Signed   By: Obie Dredge M.D.   On: 03/30/2021 13:02   MR BRAIN WO CONTRAST  Result Date: 03/30/2021 CLINICAL DATA:  Encephalopathy, history of stroke EXAM: MRI HEAD WITHOUT CONTRAST TECHNIQUE: Multiplanar, multiecho pulse sequences of the brain and surrounding structures were obtained without intravenous contrast. COMPARISON:  MRI 03/12/2019, correlation is also made with CT head 03/30/2021 FINDINGS: Evaluation is somewhat limited by motion artifact. Brain: No restricted diffusion to suggest acute or subacute infarct. No  acute hemorrhage, mass, mass effect, or midline shift. T2 hyperintense signal in the periventricular white matter, likely the sequela of chronic small vessel ischemic disease. Degree of cerebral volume loss is within normal limits for age. No hydrocephalus or extra-axial collection. Vascular: Normal flow voids. Skull and upper cervical spine: Normal marrow signal. Sinuses/Orbits: Negative. Other: The mastoids are well aerated. IMPRESSION: Evaluation is limited by motion artifact. Within this limitation, no acute intracranial process. Electronically Signed   By: Wiliam Ke M.D.   On: 03/30/2021 21:38   DG Chest Portable 1 View  Result Date: 03/30/2021 CLINICAL DATA:  Altered mental status, decreased appetite, confusion EXAM: PORTABLE CHEST 1 VIEW COMPARISON:  Portable exam 1211 hours compared 12/11/2020 FINDINGS: Borderline enlargement of cardiac silhouette. Atherosclerotic calcification  aorta. Chronic peribronchial thickening with decreased RIGHT upper lobe opacity since prior exam. No acute infiltrate, pleural effusion or pneumothorax. Bones demineralized. IMPRESSION: Chronic bronchitic changes without acute infiltrate. Aortic Atherosclerosis (ICD10-I70.0). Electronically Signed   By: Ulyses Southward M.D.   On: 03/30/2021 12:30        Scheduled Meds:  aspirin EC  81 mg Oral Daily   atorvastatin  80 mg Oral Daily   enoxaparin (LOVENOX) injection  40 mg Subcutaneous Q24H   influenza vaccine adjuvanted  0.5 mL Intramuscular Tomorrow-1000   memantine  5 mg Oral BID   pantoprazole  40 mg Oral Daily   vortioxetine HBr  20 mg Oral Q supper   Continuous Infusions:  lactated ringers 100 mL/hr at 03/31/21 0136   thiamine injection 500 mg (03/31/21 0841)     LOS: 1 day    Time spent: 35 minutes    Dorcas Carrow, MD [facility] Pager [phone]

## 2021-03-31 NOTE — Progress Notes (Signed)
Manufacturing engineer [facility])   Crystal Newton was referred to our outpatient palliative care program and was to be seen in her home on 12/15 for her first visit.  Hospital PMT has met with patient and family and for now, the plan is to dc with outpatient palliative.  Noted that patient is likely hospice appropriate, however the family indicated outpatient palliative at this time.    Should the dc plan change from outpatient palliative and the family decides they want to pursue hospice, please update [facility] and we will set up hospice prior to discharging.  Thank you, Venia Carbon RN, BSN [facility] Liaison

## 2021-03-31 NOTE — Progress Notes (Signed)
[employer] Per husband request, now that eval with speech complete and has dysphagia diet with meds crushed in pureed, home meds risperdal, klonopin, and depakote resumed

## 2021-03-31 NOTE — Consult Note (Signed)
Consultation Note Date: 03/31/2021   Patient Name: Crystal Newton  DOB: Jul 20, 1945  MRN: [id number]  Age / Sex: 75 y.o., female  PCP: Alm Bustard, NP Referring Physician: Dorcas Carrow, MD  Reason for Consultation: Establishing goals of care and Psychosocial/spiritual support  HPI/Patient Profile: 75 y.o. female  with past medical history of advanced vascular dementia, right hip arthroplasty August 2022, HTN/HLD, CVA/TIA, OSA, DM, depression, partial colectomy 1996, partial hysterectomy with uterine prolapse EGD June 2022 admitted on 03/30/2021 with acute encephalopathy, suspect progression of known advanced vascular dementia, work-up in progress.   Clinical Assessment and Goals of Care: I have reviewed medical records including EPIC notes, labs and imaging, received report from RN, assessed the patient.  Crystal Newton is lying quietly in bed with physical therapy at bedside.  Her eyes are closed, but she will open them when asked.  She is oriented to person, and somewhat to situation, but has known dementia.  I am not sure that she can make her basic needs known.  Her husband of 56 years, Christen Bame, is at bedside.    We meet to discuss diagnosis prognosis, GOC, EOL wishes, disposition and options.  I introduced Palliative Medicine as specialized medical care for people living with serious illness. It focuses on providing relief from the symptoms and stress of a serious illness. The goal is to improve quality of life for both the patient and the family.  Mr. Haughey shares that they were to have their first in-home palliative meeting 12/15.  We discussed a brief life review of the patient.  Mr. [name] been married for 56 years.  They have 2 children, Lurena Joiner and Eddie.  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   We then focused on  their current illness.  We talked about time for testing and results.  I share that we are checking images labs and giving medicines.  Questions answered.  We talked about the 3 main changes that occur with dementia including, but not limited to, 1) mental status changes 2) physical changes-Crystal Newton is experiencing physical changes at this time 3) nutritional changes.  The natural disease trajectory and expectations at EOL were discussed.  Advanced directives, concepts specific to code status, artifical feeding and hydration, and rehospitalization were considered and discussed.  We talked about the concept of "treat the treatable, but allowing natural passing".  Mr. Wik shares that Crystal Newton has completed advanced directives.  See below.  Palliative Care services outpatient were explained and offered.  They were to have their first in-home palliative meeting 12/15.  Mr. Philley shares that he has let the provider know that she is hospitalized.  Discussed the importance of continued conversation with family and the medical providers regarding overall plan of care and treatment options, ensuring decisions are within the context of the patients values and GOCs. Questions and concerns were addressed.   The family was encouraged to call with questions  or concerns.  PMT will continue to support holistically.  Conference with attending, bedside nursing staff, transition of care team related to patient condition, needs, goals of care, disposition.    HCPOA  NEXT OF KIN -spouse of 56 years, [name].  They share 2 children, Lurena Joiner and Eddie.    SUMMARY OF RECOMMENDATIONS   At this point continue to treat the treatable Time for outcomes Was to have first visit with outpatient palliative 12/15 Doing "spend down" anticipate need for long-term care   Code Status/Advance Care Planning: Full code -we talked about the concept of "treat the treatable, but allowing natural death".  Mr. Kubisiak states that Mrs.  Newton has made advanced directives.  He shares that she did indicate that if he had new information he could make different choices than what she had previously directed.  Symptom Management:  Per hospitalist, no additional needs at this time.  Palliative Prophylaxis:  Oral Care and Turn Reposition  Additional Recommendations (Limitations, Scope, Preferences): Continue to treat the treatable, time for outcomes  Psycho-social/Spiritual:  Desire for further Chaplaincy support:no Additional Recommendations: Caregiving  Support/Resources and Education on Hospice  Prognosis:  Unable to determine, based on outcomes.  3 to 6 months would not be surprising based on decreasing functional status, chronic illness burden, acute declines.  Discharge Planning:  To be determined, based on outcomes.  At this point it seems that she would not benefit from short-term rehab.  Husband is working for "spend down" in order to qualify for long-term care.       Primary Diagnoses: Present on Admission:  Acute encephalopathy   I have reviewed the medical record, interviewed the patient and family, and examined the patient. The following aspects are pertinent.  Past Medical History:  Diagnosis Date   Anxiety    Arthritis    Depression    Diabetes mellitus    diet controlled   Headache    migraine headache   History of kidney stones    Hyperlipidemia    Hypertension    Memory difficulties    Sleep disorder    Stroke [facility])    TIA (transient ischemic attack)    found on MRI.   Social History   Socioeconomic History   Marital status: Married    Spouse name: Not on file   Number of children: Not on file   Years of education: Not on file   Highest education level: Not on file  Occupational History   Occupation: day care    Employer: RETIRED  Tobacco Use   Smoking status: Never   Smokeless tobacco: Never  Vaping Use   Vaping Use: Never used  Substance and Sexual Activity   Alcohol use:  No    Alcohol/week: 0.0 standard drinks   Drug use: No   Sexual activity: Not Currently  Other Topics Concern   Not on file  Social History Narrative   Regular exercise--no, but walking on weekends.      Diet-- healthy, low carb, working on weight loss   No living will. Full Code. (Reviewed 2013)   Social Determinants of Health   Financial Resource Strain: Not on file  Food Insecurity: Not on file  Transportation Needs: Not on file  Physical Activity: Not on file  Stress: Not on file  Social Connections: Not on file   Family History  Problem Relation Age of Onset   Alzheimer's disease Mother    Diabetes Maternal Grandfather    Coronary artery disease Maternal Grandfather  Scheduled Meds:  aspirin EC  81 mg Oral Daily   atorvastatin  80 mg Oral Daily   enoxaparin (LOVENOX) injection  40 mg Subcutaneous Q24H   influenza vaccine adjuvanted  0.5 mL Intramuscular Tomorrow-1000   memantine  5 mg Oral BID   pantoprazole  40 mg Oral Daily   vortioxetine HBr  20 mg Oral Q supper   Continuous Infusions:  lactated ringers 100 mL/hr at 03/31/21 0136   thiamine injection 500 mg (03/31/21 0841)   PRN Meds:. Medications Prior to Admission:  Prior to Admission medications   Medication Sig Start Date End Date Taking? Authorizing Provider  aspirin 81 MG EC tablet Take 81 mg by mouth daily.   Yes [provider]  atorvastatin (LIPITOR) 80 MG tablet TAKE 1 TABLET BY MOUTH EVERY DAY 12/02/19  Yes Bedsole, Amy E, MD  calcium carbonate (OSCAL) 1500 (600 Ca) MG TABS tablet Take 1 tablet by mouth daily with breakfast.   Yes [provider]  clonazePAM (KLONOPIN) 0.5 MG tablet Take 0.5 mg by mouth 2 (two) times daily. 03/05/21  Yes [provider]  divalproex (DEPAKOTE) 125 MG DR tablet Take 125 mg by mouth 4 (four) times daily.   Yes [provider]  hydrOXYzine (ATARAX) 10 MG tablet Take 10 mg by mouth 2 (two) times daily as needed. 03/13/21  Yes  [provider]  hyoscyamine (LEVBID) 0.375 MG 12 hr tablet Take 0.375 mg by mouth every 12 (twelve) hours as needed for cramping.   Yes [provider]  lisinopril (ZESTRIL) 5 MG tablet TAKE 1 TABLET BY MOUTH EVERY DAY 01/02/20  Yes Bedsole, Amy E, MD  Melatonin 10 MG TABS Take 30 mg by mouth at bedtime.   Yes [provider]  memantine (NAMENDA) 10 MG tablet Take 0.5 tablets (5 mg total) by mouth 2 (two) times daily. 10/15/19  Yes Bedsole, Amy E, MD  Multiple Vitamins-Minerals (CENTRUM SILVER PO) Take 1 tablet by mouth daily.   Yes [provider]  nitroGLYCERIN (NITROSTAT) 0.3 MG SL tablet Place 1 tablet (0.3 mg total) under the tongue every 5 (five) minutes as needed for chest pain (call your doctor if pain persists after 3 doses). 05/28/20 05/28/21 Yes Shaune Pollack, MD  omeprazole (PRILOSEC) 20 MG capsule Take 40 mg by mouth daily with supper.   Yes [provider]  propranolol (INDERAL) 40 MG tablet TAKE 1 TABLET BY MOUTH TWICE A DAY Patient taking differently: Take 20 mg by mouth daily. 06/15/20  Yes Bedsole, Amy E, MD  risperiDONE (RISPERDAL) 0.5 MG tablet Take 0.5-1 mg by mouth See admin instructions. Take 1 tablet (0.5mg ) by mouth every morning, take 2 tablets (1mg ) by mouth every afternoon and take 1 tablet (0.5mg ) by mouth at bedtime   Yes [provider]  vitamin B-12 (CYANOCOBALAMIN) 1000 MCG tablet Take 1,000 mcg by mouth daily.   Yes [provider]  Vitamin D3 (VITAMIN D) 25 MCG tablet Take 1,000 Units by mouth daily.   Yes [provider]  vortioxetine HBr (TRINTELLIX) 20 MG TABS tablet Take 20 mg by mouth daily with supper.   Yes [provider]   No Known Allergies Review of Systems  Unable to perform ROS: Dementia   Physical Exam Vitals and nursing note reviewed.  Constitutional:      General: She is not in acute distress.    Appearance: She is ill-appearing.  HENT:     Head: Atraumatic.   Cardiovascular:  Rate and Rhythm: Normal rate.  Pulmonary:     Effort: Pulmonary effort is normal. No respiratory distress.  Musculoskeletal:        General: No swelling.  Skin:    General: Skin is warm and dry.  Neurological:     Mental Status: She is alert.     Comments: Known dementia  Psychiatric:        Mood and Affect: Mood normal.        Behavior: Behavior normal.     Comments: Calm and cooperative, not fearful    Vital Signs: BP (!) 121/59 (BP Location: Left Arm)    Pulse 67    Temp 98 F (36.7 C) (Oral)    Resp 16    Ht 5' (1.524 m)    Wt 57.2 kg    SpO2 95%    BMI 24.63 kg/m  Pain Scale: PAINAD       SpO2: SpO2: 95 % O2 Device:SpO2: 95 % O2 Flow Rate: .   IO: Intake/output summary:  Intake/Output Summary (Last 24 hours) at 03/31/2021 1223 Last data filed at 03/31/2021 0630 Gross per 24 hour  Intake 1070 ml  Output 550 ml  Net 520 ml    LBM: Last BM Date: 03/27/21 Baseline Weight: Weight: 63.5 kg Most recent weight: Weight: 57.2 kg     Palliative Assessment/Data:   Flowsheet Rows    Flowsheet Row Most Recent Value  Intake Tab   Referral Department Hospitalist  Unit at Time of Referral Med/Surg Unit  Palliative Care Primary Diagnosis Neurology  Date Notified 03/31/21  Palliative Care Type New Palliative care  Reason for referral Clarify Goals of Care  Date of Admission 03/30/21  Date first seen by Palliative Care 03/31/21  # of days Palliative referral response time 0 Day(s)  # of days IP prior to Palliative referral 1  Clinical Assessment   Palliative Performance Scale Score 30%  Pain Max last 24 hours Not able to report  Pain Min Last 24 hours Not able to report  Dyspnea Max Last 24 Hours Not able to report  Dyspnea Min Last 24 hours Not able to report  Psychosocial & Spiritual Assessment   Palliative Care Outcomes        Time In: 0900 Time Out: 1010 Time Total: 70 minutes  Greater than 50%  of this time was spent counseling and  coordinating care related to the above assessment and plan.  Signed by: Katheran Awe, NP   Please contact Palliative Medicine Team phone at [phone] for questions and concerns.  For individual provider: See Loretha Stapler

## 2021-04-01 ENCOUNTER — Other Ambulatory Visit: Payer: Medicare Other | Admitting: Student

## 2021-04-01 NOTE — Progress Notes (Signed)
Palliative:   Mrs. Cocke is lying quietly in bed.  She greets me, making and somewhat keeping eye contact.  She appears chronically ill and somewhat frail.  She has known dementia and I do not ask orientation questions.  I believe that she is able to make her basic needs known.  Her husband, Christen Bame, is at bedside.  We talked about disposition today.  We talked about the differences between palliative care and hospice care.  We talked about what is and is not provided at each.  At this point, Christen Bame is agreeable to home with "treat the treatable" hospice care.  Provider choice continues to be [facility].  Conference with attending, bedside nursing staff, transition of care team, and house hospice rep related to patient condition, needs, goals of care, disposition.  Plan: At this point continue to treat the treatable.  Continue CODE STATUS discussions on an outpatient basis after relationship building.  Treat the treatable hospice care with [facility]  35 minutes   Lillia Carmel, NP Palliative medicine team Team phone [phone] Greater than 50% of this time was spent counseling and coordinating care related to the above assessment and plan.  All

## 2021-04-01 NOTE — Progress Notes (Addendum)
[facility]) Hospital Liaison Note  Received request from Transitions of Care Manager Maryann Alar, RN, for hospice services at home after discharge. Chart and patient information reviewed by [facility] physician. Hospice eligibility confirmed.  Visited patient at bedside and spoke with husband Richarda Overlie to initiate education related to hospice philosophy, services and team approach to care. Patient/family verbalized understanding of information provided. Per discussion, the plan for discharge is today via private vehicle.  DME needs discussed. No DME needs at this time.   Please provide prescriptions at discharge as needed to ensure ongoing symptom management.   [facility] information and contact numbers given to family. Above information shared with [facility] Manager.   Please do not hesitate to call with any hospice related questions or concerns.   Thank you for the opportunity to participate in this patient's care.   Celene Squibb, RN, BSN [facility] Liaison [phone]

## 2021-04-01 NOTE — Discharge Summary (Signed)
Physician Discharge Summary  Crystal Newton ZOX:[id number] DOB: 1945-08-09 DOA: 03/30/2021  PCP: Alm Bustard, NP  Admit date: 03/30/2021 Discharge date: 04/01/2021  Admitted From: Home Disposition: Home  Recommendations for Outpatient Follow-up:  To be seen by palliative and hospice at home.  Home Health: N/A Equipment/Devices: Already present at home including wheelchair  Discharge Condition: Fair CODE STATUS: Full code Diet recommendation: Regular diet.  Aspiration precautions.  Discharge summary: 75 year old female with history of advanced vascular dementia, hypertension, history of stroke brought from home with about 2 days of not responding as usual.  Patient is mostly bedbound.  She is able to participate in feeding and self-care.  She has dementia for about 10 years now, followed by neurology.  Her dementia has been rapidly declining since she developed COVID few months ago as well as after she suffered a hip fracture.  Lives at home with husband and no oral intake for the last 2 days.  Went to [facility] and was directed to the ER.  Investigations essentially normal.   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 Symptomatology consistent with advanced dementia with failure to thrive. Treated with IV fluids and multivitamins.  More awake today and able to maintain oral intake. Resume her home medications including propanolol, Risperdal, clonazepam and Depakote. Seen by speech therapy, recommended aspiration precautions.  Normal swallowing. Seen by physical therapy, she is already mobilizing on wheelchair and total care by family. Seen by palliative care, detailed discussion with patient's family and interested in hospice at home for more support and to continue to have conversation about goal of care and  possibly may need long-term care with hospice. No reversible cause were found.  Negative for infection.  Negative for acute stroke.  Patient is more alert and interactive today and family reported back to herself.  Going home today.  Declined home health and therapies.  Stable for discharge with family.        Discharge Diagnoses:  Principal Problem:   Acute encephalopathy    Discharge Instructions  Discharge Instructions     Amb Referral to Palliative Care   Complete by: As directed    Resume follow up . Family interested in home hospice after discharge   Diet general   Complete by: As directed    Discharge instructions   Complete by: As directed    All time fall precautions   Increase activity slowly   Complete by: As directed       Allergies as of 04/01/2021   No Known Allergies      Medication List     TAKE these medications    aspirin 81 MG EC tablet Take 81 mg by mouth daily.   atorvastatin 80 MG tablet Commonly known as: LIPITOR TAKE 1 TABLET BY MOUTH EVERY DAY   calcium carbonate 1500 (600 Ca) MG Tabs tablet Commonly known as: OSCAL Take 1 tablet by mouth daily with breakfast.   CENTRUM SILVER PO Take 1 tablet by mouth daily.   clonazePAM 0.5 MG tablet Commonly known as: KLONOPIN Take 0.5 mg by mouth 2 (two) times daily.   divalproex 125 MG DR tablet Commonly known as: DEPAKOTE Take 125 mg by mouth 4 (four) times daily.   hydrOXYzine 10 MG tablet Commonly known as: ATARAX Take 10 mg by mouth 2 (two) times daily as needed.  hyoscyamine 0.375 MG 12 hr tablet Commonly known as: LEVBID Take 0.375 mg by mouth every 12 (twelve) hours as needed for cramping.   lisinopril 5 MG tablet Commonly known as: ZESTRIL TAKE 1 TABLET BY MOUTH EVERY DAY   Melatonin 10 MG Tabs Take 30 mg by mouth at bedtime.   memantine 10 MG tablet Commonly known as: NAMENDA Take 0.5 tablets (5 mg total) by mouth 2 (two) times daily.   nitroGLYCERIN 0.3 MG SL  tablet Commonly known as: Nitrostat Place 1 tablet (0.3 mg total) under the tongue every 5 (five) minutes as needed for chest pain (call your doctor if pain persists after 3 doses).   omeprazole 20 MG capsule Commonly known as: PRILOSEC Take 40 mg by mouth daily with supper.   propranolol 40 MG tablet Commonly known as: INDERAL TAKE 1 TABLET BY MOUTH TWICE A DAY What changed:  how much to take when to take this   risperiDONE 0.5 MG tablet Commonly known as: RISPERDAL Take 0.5-1 mg by mouth See admin instructions. Take 1 tablet (0.5mg ) by mouth every morning, take 2 tablets ( ) by mouth every afternoon and take 1 tablet (0.5mg ) by mouth at bedtime   vitamin B-12 1000 MCG tablet Commonly known as: CYANOCOBALAMIN Take 1,000 mcg by mouth daily.   Vitamin D3 25 MCG tablet Commonly known as: Vitamin D Take 1,000 Units by mouth daily.   vortioxetine HBr 20 MG Tabs tablet Commonly known as: TRINTELLIX Take 20 mg by mouth daily with supper.        Follow-up Information     Fields, Lisabeth Pick, NP. Go on 04/08/2021.   Specialty: Family Medicine Why: :Azzie Roup information: [address] [place] [address] [phone]                No Known Allergies  Consultations: Palliative care   Procedures/Studies: DG Pelvis 1-2 Views  Result Date: 03/30/2021 CLINICAL DATA:  Fall, bilateral hip pain EXAM: PELVIS - 1-2 VIEW COMPARISON:  None. FINDINGS: Prior right hip replacement. No hardware complicating feature. No acute bony abnormality. Specifically, no fracture, subluxation, or dislocation. SI joints symmetric and unremarkable. IMPRESSION: No acute bony abnormality. Electronically Signed   By: Charlett Nose M.D.   On: 03/30/2021 17:15   CT HEAD WO CONTRAST ( )  Result Date: 03/30/2021 CLINICAL DATA:  Lethargy. EXAM: CT HEAD WITHOUT CONTRAST TECHNIQUE: Contiguous axial images were obtained from the base of the skull through the vertex without  intravenous contrast. COMPARISON:  CT head dated December 11, 2020. FINDINGS: Brain: No evidence of acute infarction, hemorrhage, hydrocephalus, extra-axial collection or mass lesion/mass effect. Stable mild atrophy and chronic microvascular ischemic changes. Vascular: Calcified atherosclerosis at the skull base. No hyperdense vessel. Skull: Normal. Negative for fracture or focal lesion. Sinuses/Orbits: No acute finding. Other: None. IMPRESSION: 1. No acute intracranial abnormality. Electronically Signed   By: Obie Dredge M.D.   On: 03/30/2021 13:02   MR BRAIN WO CONTRAST  Result Date: 03/30/2021 CLINICAL DATA:  Encephalopathy, history of stroke EXAM: MRI HEAD WITHOUT CONTRAST TECHNIQUE: Multiplanar, multiecho pulse sequences of the brain and surrounding structures were obtained without intravenous contrast. COMPARISON:  MRI 03/12/2019, correlation is also made with CT head 03/30/2021 FINDINGS: Evaluation is somewhat limited by motion artifact. Brain: No restricted diffusion to suggest acute or subacute infarct. No acute hemorrhage, mass, mass effect, or midline shift. T2 hyperintense signal in the periventricular white matter, likely the sequela of chronic small vessel ischemic disease. Degree of cerebral volume loss  is within normal limits for age. No hydrocephalus or extra-axial collection. Vascular: Normal flow voids. Skull and upper cervical spine: Normal marrow signal. Sinuses/Orbits: Negative. Other: The mastoids are well aerated. IMPRESSION: Evaluation is limited by motion artifact. Within this limitation, no acute intracranial process. Electronically Signed   By: Wiliam Ke M.D.   On: 03/30/2021 21:38   DG Chest Portable 1 View  Result Date: 03/30/2021 CLINICAL DATA:  Altered mental status, decreased appetite, confusion EXAM: PORTABLE CHEST 1 VIEW COMPARISON:  Portable exam 1211 hours compared 12/11/2020 FINDINGS: Borderline enlargement of cardiac silhouette. Atherosclerotic calcification  aorta. Chronic peribronchial thickening with decreased RIGHT upper lobe opacity since prior exam. No acute infiltrate, pleural effusion or pneumothorax. Bones demineralized. IMPRESSION: Chronic bronchitic changes without acute infiltrate. Aortic Atherosclerosis (ICD10-I70.0). Electronically Signed   By: Ulyses Southward M.D.   On: 03/30/2021 12:30   (Echo, Carotid, EGD, Colonoscopy, ERCP)    Subjective: Patient seen and examined.  She was slightly awake today.  Answered few basic questions.  Husband reported that he was able to feed her half of the meal and some liquids.  He thinks he is back to her usual mentation.  He is looking forward to take her home. Family is discussing about need for long-term care and they are agreeable to be seen at home by home hospice.   Discharge Exam: Vitals:   04/01/21 0558 04/01/21 0723  BP: (!) 146/52 (!) 148/54  Pulse: (!) 54 (!) 48  Resp: 16 16  Temp: 98.5 F (36.9 C) 98.3 F (36.8 C)  SpO2: 94% 96%   Vitals:   03/31/21 1947 04/01/21 0047 04/01/21 0558 04/01/21 0723  BP: (!) 152/70 139/60 (!) 146/52 (!) 148/54  Pulse: 67 (!) 50 (!) 54 (!) 48  Resp: 16 16 16 16   Temp: 98.7 F (37.1 C) 99 F (37.2 C) 98.5 F (36.9 C) 98.3 F (36.8 C)  TempSrc:    Oral  SpO2: 95% 95% 94% 96%  Weight:      Height:        General: Pt is alert, awake, not in acute distress Slightly anxious.  She is frail and debilitated.  Looks older than stated age. Oriented x1-2.  Answers basic questions.  No focal neurological deficits. Cardiovascular: RRR, S1/S2 +, no rubs, no gallops Respiratory: CTA bilaterally, no wheezing, no rhonchi Abdominal: Soft, NT, ND, bowel sounds + Extremities: no edema, no cyanosis    The results of significant diagnostics from this hospitalization (including imaging, microbiology, ancillary and laboratory) are listed below for reference.     Microbiology: Recent Results (from the past 240 hour(s))  Culture, blood (routine x 2)     Status:  None (Preliminary result)   Collection Time: 03/30/21 12:10 PM   Specimen: BLOOD  Result Value Ref Range Status   Specimen Description BLOOD LEFT FA  Final   Special Requests   Final    BOTTLES DRAWN AEROBIC AND ANAEROBIC Blood Culture adequate volume   Culture   Final    NO GROWTH 2 DAYS Performed at [facility], [address] [place], [place]    Report Status PENDING  Incomplete  Culture, blood (routine x 2)     Status: None (Preliminary result)   Collection Time: 03/30/21 12:12 PM   Specimen: BLOOD  Result Value Ref Range Status   Specimen Description BLOOD RIGHT AC  Final   Special Requests   Final    BOTTLES DRAWN AEROBIC AND ANAEROBIC Blood Culture adequate volume  Culture   Final    NO GROWTH 2 DAYS Performed at [facility], [address] [place], [place] [address]    Report Status PENDING  Incomplete  Urine Culture     Status: None   Collection Time: 03/30/21  3:04 PM   Specimen: Urine, Random  Result Value Ref Range Status   Specimen Description   Final    URINE, RANDOM Performed at [facility], [address], [place], [place] [address]    Special Requests   Final    NONE Performed at [facility], [address], [place], [place] [address]    Culture   Final    NO GROWTH Performed at [facility], [address], [place], [place] [address]    Report Status 03/31/2021 FINAL  Final  Resp Panel by RT-PCR (Flu A&B, Covid) Nasopharyngeal Swab     Status: None   Collection Time: 03/30/21  3:16 PM   Specimen: Nasopharyngeal Swab; Nasopharyngeal(NP) swabs in vial transport medium  Result Value Ref Range Status   SARS Coronavirus 2 by RT PCR NEGATIVE NEGATIVE Final    Comment: (NOTE) SARS-CoV-2 target nucleic acids are NOT DETECTED.  The SARS-CoV-2 RNA is generally detectable in upper respiratory specimens during the acute phase of infection. The lowest concentration of SARS-CoV-2 viral copies this  assay can detect is 138 copies/mL. A negative result does not preclude SARS-Cov-2 infection and should not be used as the sole basis for treatment or other patient management decisions. A negative result may occur with  improper specimen collection/handling, submission of specimen other than nasopharyngeal swab, presence of viral mutation(s) within the areas targeted by this assay, and inadequate number of viral copies(<138 copies/mL). A negative result must be combined with clinical observations, patient history, and epidemiological information. The expected result is Negative.  Fact Sheet for Patients:  BloggerCourse.com  Fact Sheet for Healthcare Providers:  SeriousBroker.it  This test is no t yet approved or cleared by the [place] FDA and  has been authorized for detection and/or diagnosis of SARS-CoV-2 by FDA under an Emergency Use Authorization (EUA). This EUA will remain  in effect (meaning this test can be used) for the duration of the COVID-19 declaration under Section 564(b)(1) of the Act, 21 U.S.C.section 360bbb-3(b)(1), unless the authorization is terminated  or revoked sooner.       Influenza A by PCR NEGATIVE NEGATIVE Final   Influenza B by PCR NEGATIVE NEGATIVE Final    Comment: (NOTE) The Xpert Xpress SARS-CoV-2/FLU/RSV plus assay is intended as an aid in the diagnosis of influenza from Nasopharyngeal swab specimens and should not be used as a sole basis for treatment. Nasal washings and aspirates are unacceptable for Xpert Xpress SARS-CoV-2/FLU/RSV testing.  Fact Sheet for Patients: BloggerCourse.com  Fact Sheet for Healthcare Providers: SeriousBroker.it  This test is not yet approved or cleared by the [place] FDA and has been authorized for detection and/or diagnosis of SARS-CoV-2 by FDA under an Emergency Use Authorization (EUA). This EUA will  remain in effect (meaning this test can be used) for the duration of the COVID-19 declaration under Section 564(b)(1) of the Act, 21 U.S.C. section 360bbb-3(b)(1), unless the authorization is terminated or revoked.  Performed at [facility], [address] [place], [place] [address]      Labs: BNP (last 3 results) No results for input(s): BNP in the last 8760 hours. Basic Metabolic Panel: Recent Labs  Lab 03/30/21 1139 03/31/21 0531  NA 139  140  K 3.8 3.5  CL 103 109  CO2 26 24  GLUCOSE 78 66*  BUN 32* 24*  CREATININE 0.57 0.49  CALCIUM 9.3 8.7*   Liver Function Tests: Recent Labs  Lab 03/30/21 1139  AST 20  ALT 10  ALKPHOS 78  BILITOT 1.3*  PROT 6.3*  ALBUMIN 3.5   No results for input(s): LIPASE, AMYLASE in the last 168 hours. No results for input(s): AMMONIA in the last 168 hours. CBC: Recent Labs  Lab 03/30/21 1139 03/31/21 0531  WBC 8.6 5.6  NEUTROABS 6.6  --   HGB 12.6 10.8*  HCT 39.6 32.2*  MCV 96.8 95.0  PLT 184 150   Cardiac Enzymes: No results for input(s): CKTOTAL, CKMB, CKMBINDEX, TROPONINI in the last 168 hours. BNP: Invalid input(s): POCBNP CBG: Recent Labs  Lab 03/30/21 1137 03/31/21 2021  GLUCAP 76 110*   D-Dimer No results for input(s): DDIMER in the last 72 hours. Hgb A1c No results for input(s): HGBA1C in the last 72 hours. Lipid Profile No results for input(s): CHOL, HDL, LDLCALC, TRIG, CHOLHDL, LDLDIRECT in the last 72 hours. Thyroid function studies Recent Labs    03/30/21 1139  TSH 0.405   Anemia work up Recent Labs    03/30/21 1139  VITAMINB12 2,701*   Urinalysis    Component Value Date/Time   COLORURINE YELLOW 03/30/2021 1504   APPEARANCEUR CLEAR 03/30/2021 1504   LABSPEC >1.030 (H) 03/30/2021 1504   PHURINE 5.5 03/30/2021 1504   GLUCOSEU NEGATIVE 03/30/2021 1504   HGBUR NEGATIVE 03/30/2021 1504   BILIRUBINUR SMALL (A) 03/30/2021 1504   BILIRUBINUR Negative 03/20/2014 0930   KETONESUR  >160 (A) 03/30/2021 1504   PROTEINUR NEGATIVE 03/30/2021 1504   UROBILINOGEN 0.2 03/20/2014 0930   UROBILINOGEN 1.0 07/31/2013 2053   NITRITE NEGATIVE 03/30/2021 1504   LEUKOCYTESUR NEGATIVE 03/30/2021 1504   Sepsis Labs Invalid input(s): PROCALCITONIN,  WBC,  LACTICIDVEN Microbiology Recent Results (from the past 240 hour(s))  Culture, blood (routine x 2)     Status: None (Preliminary result)   Collection Time: 03/30/21 12:10 PM   Specimen: BLOOD  Result Value Ref Range Status   Specimen Description BLOOD LEFT FA  Final   Special Requests   Final    BOTTLES DRAWN AEROBIC AND ANAEROBIC Blood Culture adequate volume   Culture   Final    NO GROWTH 2 DAYS Performed at [facility], [address] [place], [place] [address]    Report Status PENDING  Incomplete  Culture, blood (routine x 2)     Status: None (Preliminary result)   Collection Time: 03/30/21 12:12 PM   Specimen: BLOOD  Result Value Ref Range Status   Specimen Description BLOOD RIGHT [facility]  Final   Special Requests   Final    BOTTLES DRAWN AEROBIC AND ANAEROBIC Blood Culture adequate volume   Culture   Final    NO GROWTH 2 DAYS Performed at [facility], [address] [place], [place] [address]    Report Status PENDING  Incomplete  Urine Culture     Status: None   Collection Time: 03/30/21  3:04 PM   Specimen: Urine, Random  Result Value Ref Range Status   Specimen Description   Final    URINE, RANDOM Performed at [facility], [address] [place], [place] [address]    Special Requests   Final    NONE Performed at [facility], [address] [place], [place] [address]    Culture  Final    NO GROWTH Performed at [facility], [address], [place], [place] [address]    Report Status 03/31/2021 FINAL  Final  Resp Panel by RT-PCR (Flu A&B, Covid) Nasopharyngeal Swab     Status: None   Collection Time: 03/30/21  3:16 PM   Specimen: Nasopharyngeal Swab;  Nasopharyngeal(NP) swabs in vial transport medium  Result Value Ref Range Status   SARS Coronavirus 2 by RT PCR NEGATIVE NEGATIVE Final    Comment: (NOTE) SARS-CoV-2 target nucleic acids are NOT DETECTED.  The SARS-CoV-2 RNA is generally detectable in upper respiratory specimens during the acute phase of infection. The lowest concentration of SARS-CoV-2 viral copies this assay can detect is 138 copies/mL. A negative result does not preclude SARS-Cov-2 infection and should not be used as the sole basis for treatment or other patient management decisions. A negative result may occur with  improper specimen collection/handling, submission of specimen other than nasopharyngeal swab, presence of viral mutation(s) within the areas targeted by this assay, and inadequate number of viral copies(<138 copies/mL). A negative result must be combined with clinical observations, patient history, and epidemiological information. The expected result is Negative.  Fact Sheet for Patients:  BloggerCourse.com  Fact Sheet for Healthcare Providers:  SeriousBroker.it  This test is no t yet approved or cleared by the [place] FDA and  has been authorized for detection and/or diagnosis of SARS-CoV-2 by FDA under an Emergency Use Authorization (EUA). This EUA will remain  in effect (meaning this test can be used) for the duration of the COVID-19 declaration under Section 564(b)(1) of the Act, 21 U.S.C.section 360bbb-3(b)(1), unless the authorization is terminated  or revoked sooner.       Influenza A by PCR NEGATIVE NEGATIVE Final   Influenza B by PCR NEGATIVE NEGATIVE Final    Comment: (NOTE) The Xpert Xpress SARS-CoV-2/FLU/RSV plus assay is intended as an aid in the diagnosis of influenza from Nasopharyngeal swab specimens and should not be used as a sole basis for treatment. Nasal washings and aspirates are unacceptable for Xpert Xpress  SARS-CoV-2/FLU/RSV testing.  Fact Sheet for Patients: BloggerCourse.com  Fact Sheet for Healthcare Providers: SeriousBroker.it  This test is not yet approved or cleared by the [place] FDA and has been authorized for detection and/or diagnosis of SARS-CoV-2 by FDA under an Emergency Use Authorization (EUA). This EUA will remain in effect (meaning this test can be used) for the duration of the COVID-19 declaration under Section 564(b)(1) of the Act, 21 U.S.C. section 360bbb-3(b)(1), unless the authorization is terminated or revoked.  Performed at [facility], [address] [place], [place] [address]      Time coordinating discharge:  35 minutes  SIGNED:   Dorcas Carrow, MD  [facility] 04/01/2021, 11:21 AM

## 2021-04-01 NOTE — TOC Transition Note (Addendum)
Transition of Care [facility]) - CM/SW Discharge Note   Patient Details  Name: [name] MRN: [id number] Date of Birth: 06/13/1945  [facility] of [facility]) CM/SW Contact:  Hetty Ely, RN Phone Number: 04/01/2021, 9:17 AM   Clinical Narrative: Patient to discharge home with husband, Crystal Newton. Husband declines HHPT/OT and Hospice at this time, indicates that he can take care of patient. May consider at a later time, will discuss with PCP if needed for referral. TOC barriers resolved.  10:30am Palliative spoke with patient and husband, they agree with Hospice services at home. [facility] Care referral will be submitted by Palliative, Rennis Golden, NP.    Final next level of care: Home/Self Care Barriers to Discharge: Barriers Resolved   Patient Goals and CMS Choice        Discharge Placement                  Name of family member notified: Crystal Newton, spouse Patient and family notified of of transfer: 04/01/21  Discharge Plan and Services                          HH Arranged: Refused HH (Husband)          Social Determinants of Health (SDOH) Interventions     Readmission Risk Interventions No flowsheet data found.

## 2021-04-01 NOTE — Progress Notes (Signed)
Received Md order to discharge patient to home with husband Kacie Huxtable, reviewed discharge instructions, home meds and follow up appointments with patient and spouse and they verbalized understanding.

## 2021-04-02 DIAGNOSIS — S72001A Fracture of unspecified part of neck of right femur, initial encounter for closed fracture: Secondary | ICD-10-CM | POA: Diagnosis not present

## 2021-04-02 DIAGNOSIS — M6281 Muscle weakness (generalized): Secondary | ICD-10-CM | POA: Diagnosis not present

## 2021-04-04 LAB — CULTURE, BLOOD (ROUTINE X 2)
Culture: NO GROWTH
Culture: NO GROWTH
Special Requests: ADEQUATE
Special Requests: ADEQUATE

## 2021-05-19 DEATH — deceased

## 2022-10-14 IMAGING — CR DG CHEST 2V
1 series · 2 of 2 positions shown · non-contrast
Comparison: Chest radiograph dated 06/16/2015.

CLINICAL DATA: 74-year-old female with chest pain.

EXAM:
CHEST - 2 VIEW

[Series 1: dg chest 2 view · 0.14mm/px · 2 of 2 slices shown]
[im 1/2]
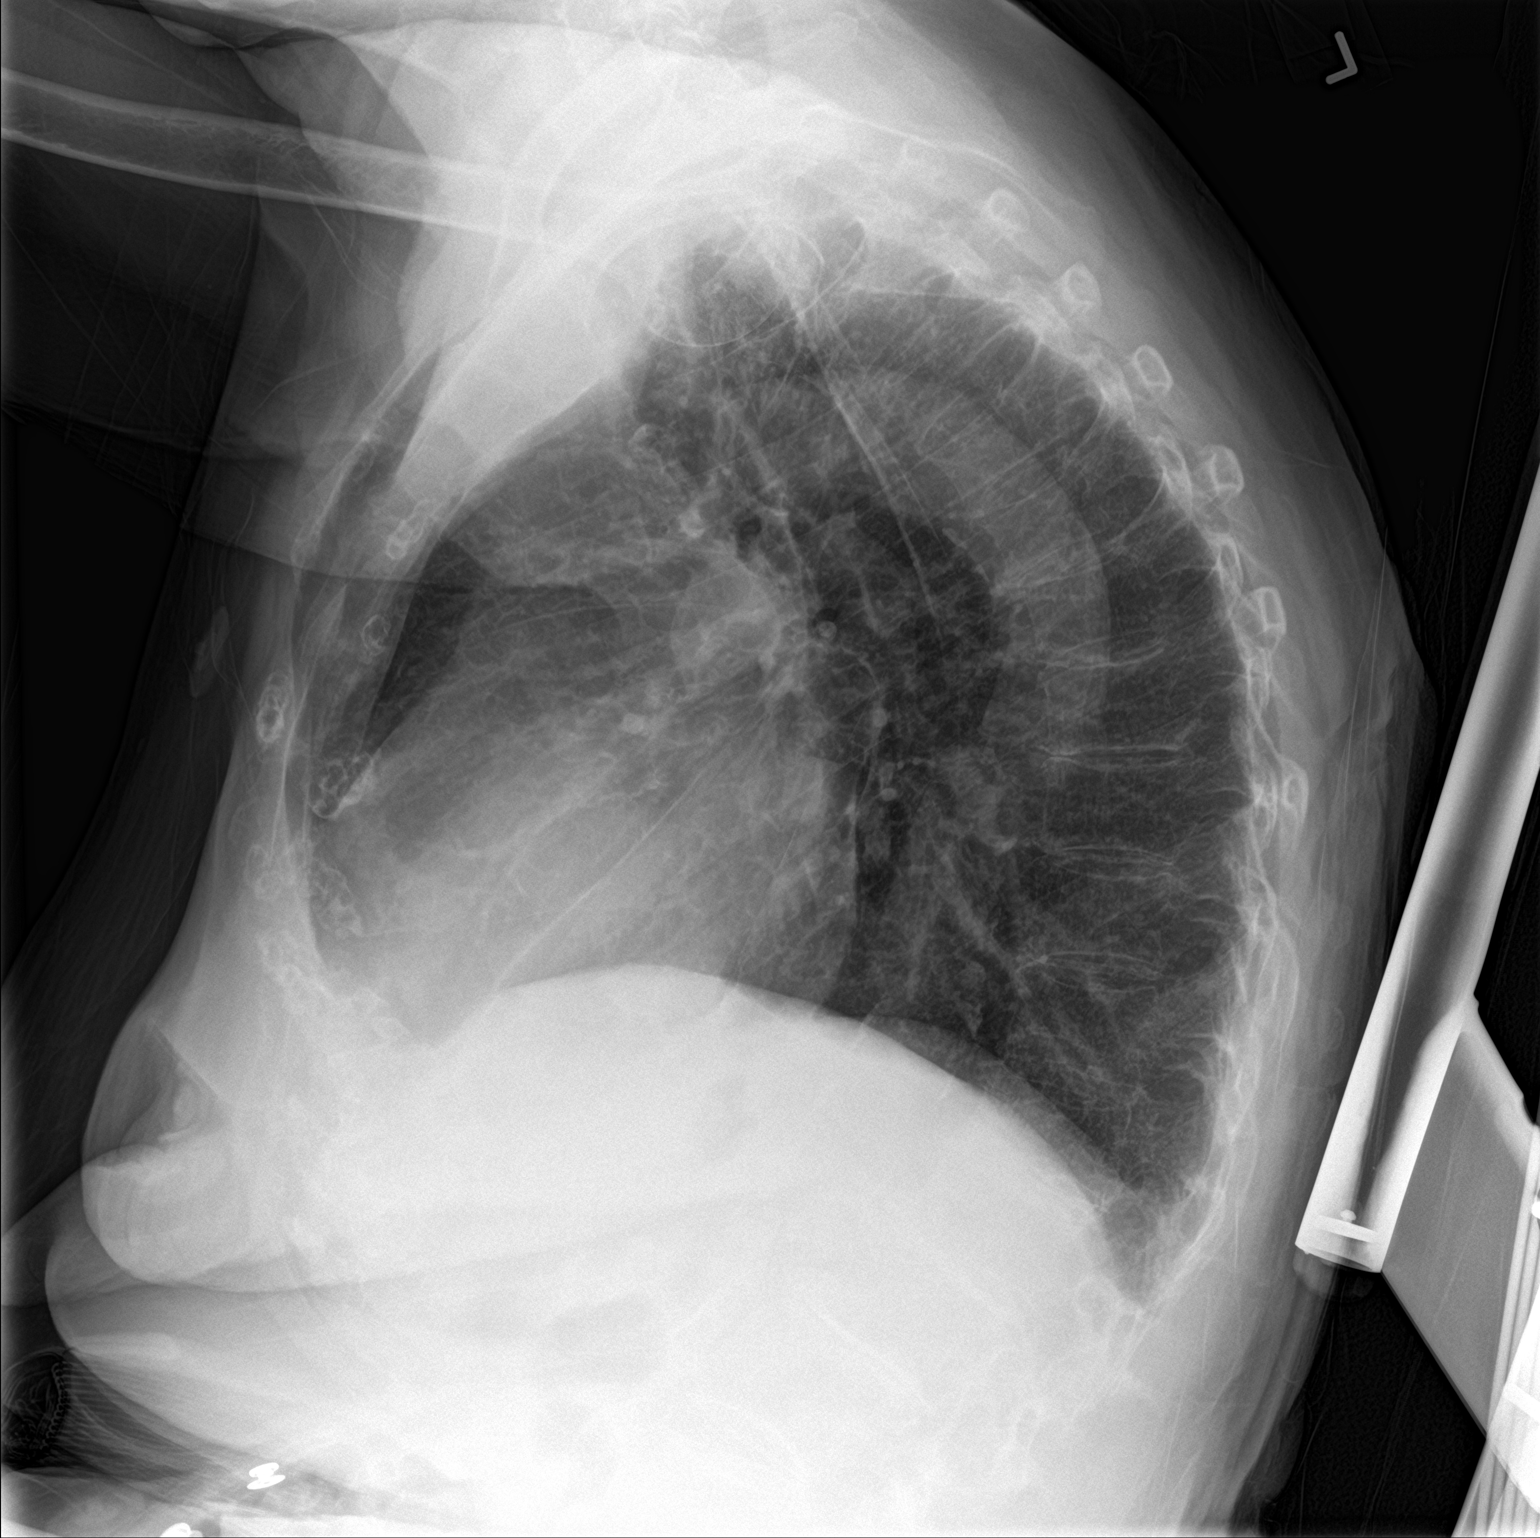
[im 2/2]
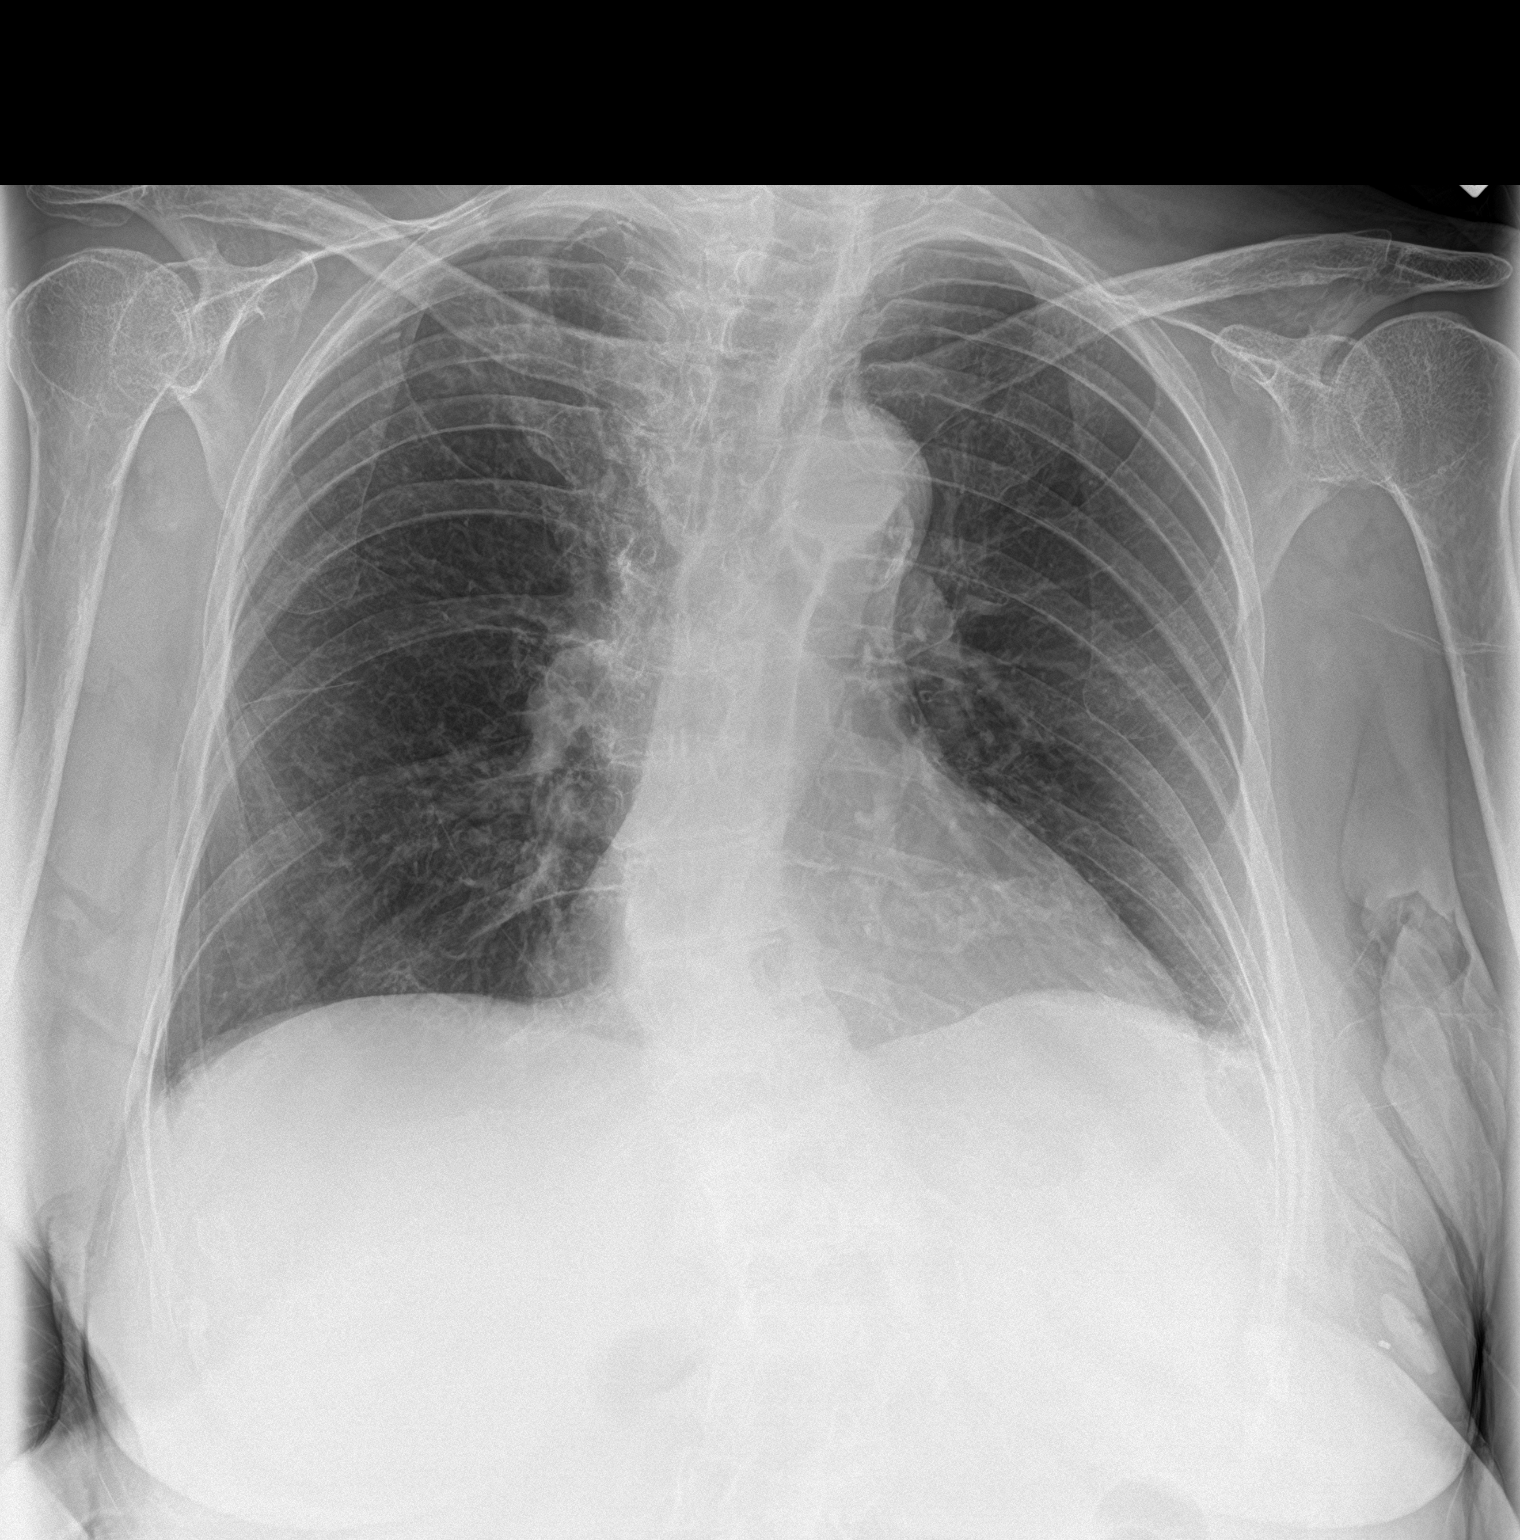

[2 of 2 positions shown; findings below may reference images not displayed]

FINDINGS: No focal consolidation, pleural effusion or pneumothorax cardiac
silhouette is within limits. Atherosclerotic calcification of the
aorta osteopenia with degenerative changes of the spine and
scoliosis. No acute osseous pathology.
IMPRESSION: No active cardiopulmonary disease.

## 2022-10-22 IMAGING — RF DG ESOPHAGUS
9 of 10 series · 14 of 22 positions shown · non-contrast
Comparison: No prior.

CLINICAL DATA: Atypical chest pain.  Gastroesophageal reflux.

EXAM:
ESOPHOGRAM / BARIUM SWALLOW / BARIUM TABLET STUDY
TECHNIQUE: Combined double contrast and single contrast examination performed
using effervescent crystals, thick barium liquid, and thin barium
liquid. The patient was observed with fluoroscopy swallowing a 13 mm
barium sulphate tablet.
FLUOROSCOPY TIME:  Fluoroscopy Time:  1 minutes 18 seconds
Radiation Exposure Index (if provided by the fluoroscopic device):
24.0 mGy

[Series 1: fluoro_barium 2fps_bw · 0.18mm/px · 3 of 5 frames shown (1 of 8)]
[frame 1/5]
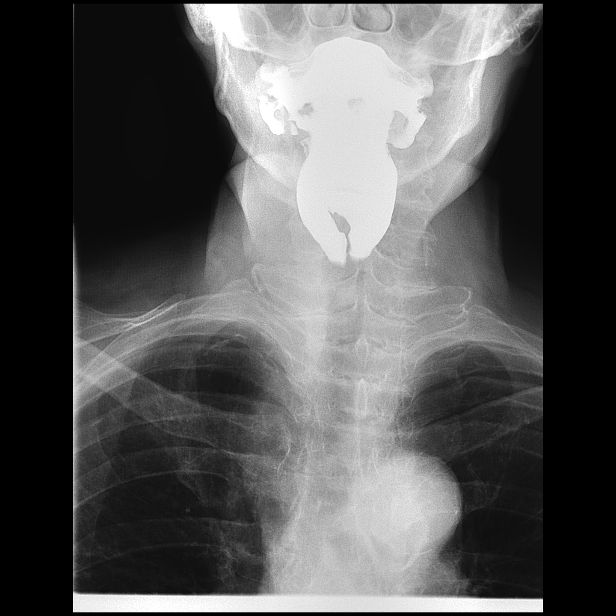
[frame 3/5]
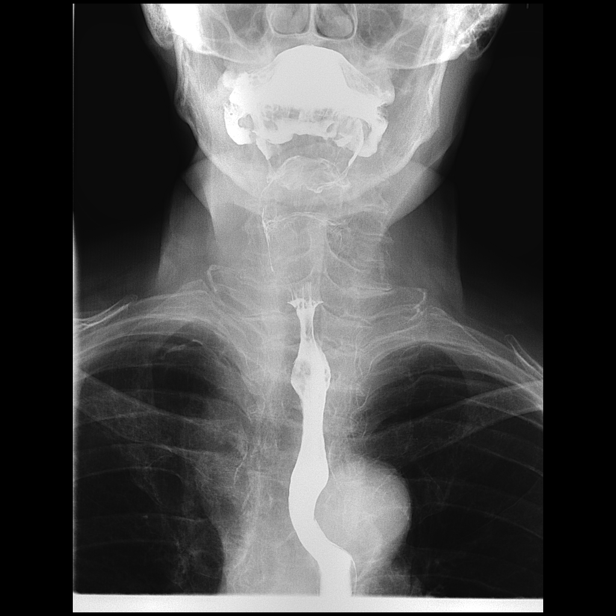
[frame 5/5]
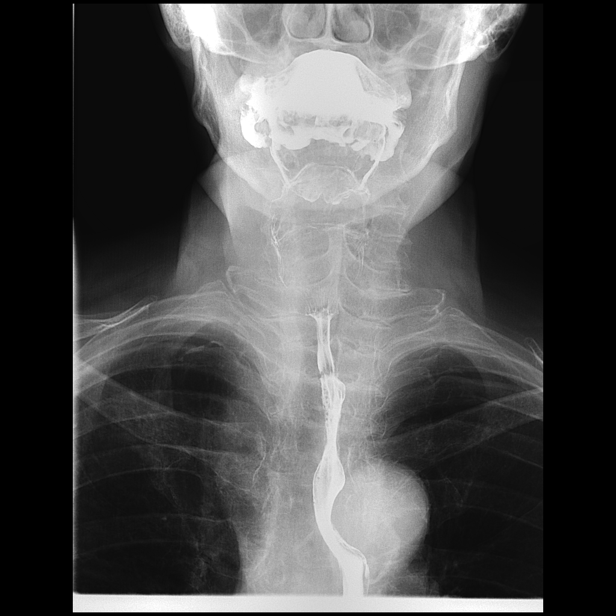

[Series 2: fluoro_barium 2fps_bw · 0.18mm/px · 1 of 10 frames shown (2 of 8)]
[frame 6/10]
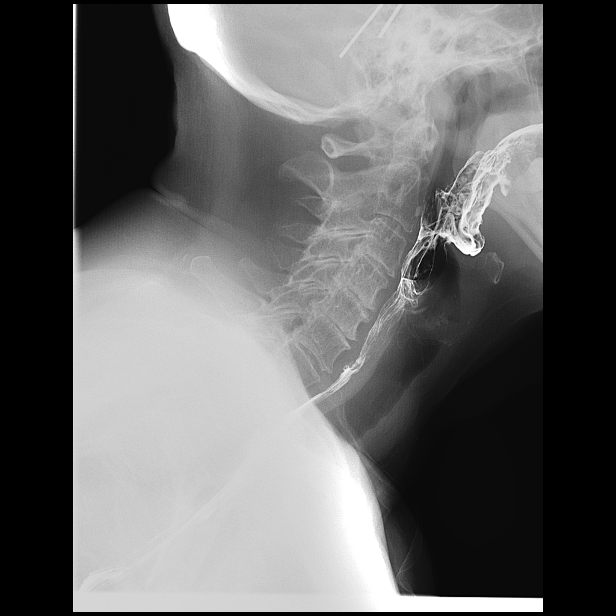

[Series 3: fluoro_barium 2fps_bw · 0.18mm/px · 3 of 9 frames shown (3 of 8)]
[frame 2/9]
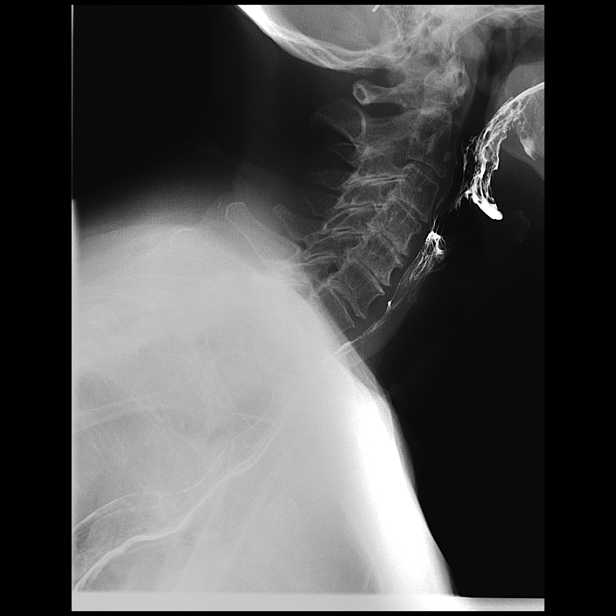
[frame 5/9]
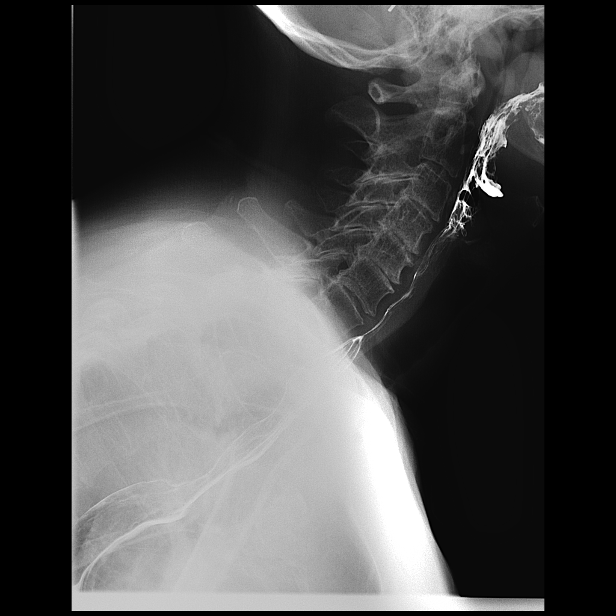
[frame 8/9]
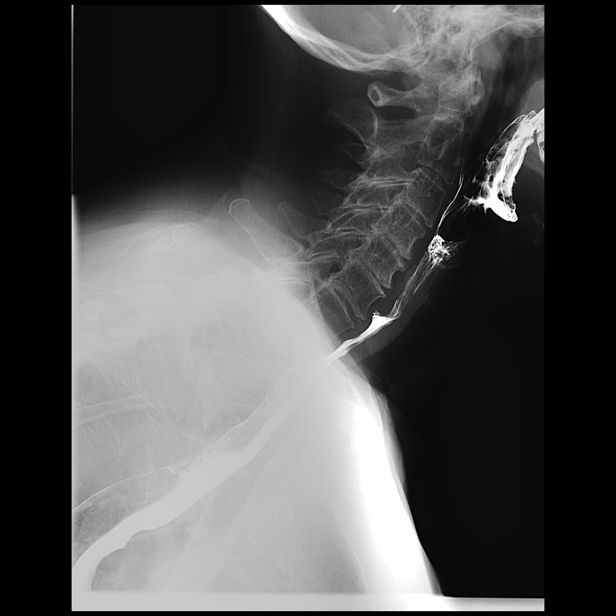

[Series 4: fluoro_barium 2fps_bw · 0.18mm/px · 1 of 2 frames shown (4 of 8)]
[frame 1/2]
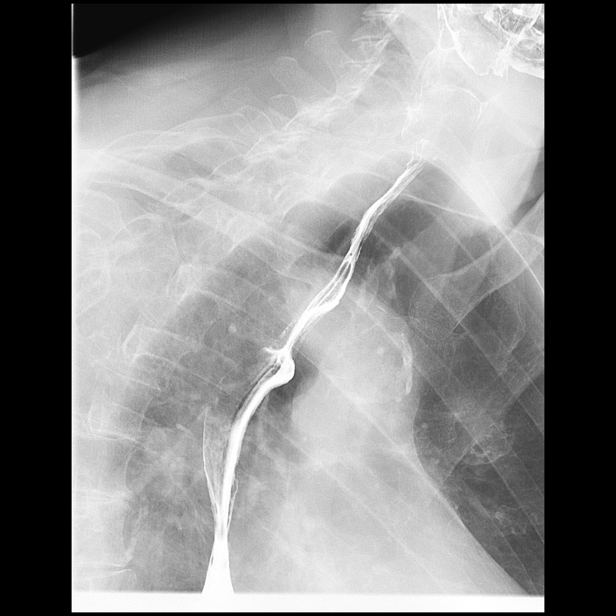

[Series 5: fluoro_barium 2fps_bw · 0.18mm/px · 1 of 1 slices shown (5 of 8)]
[im 1/1]
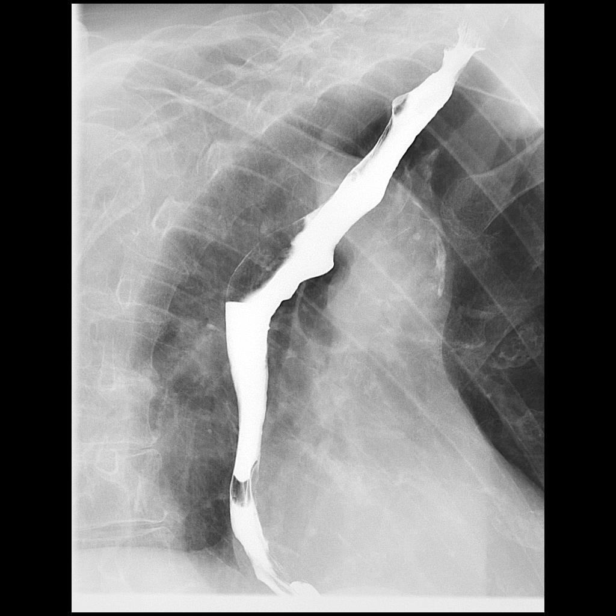

[Series 6: fluoro_barium 2fps_bw · 0.18mm/px · 1 of 2 frames shown (6 of 8)]
[frame 1/2]
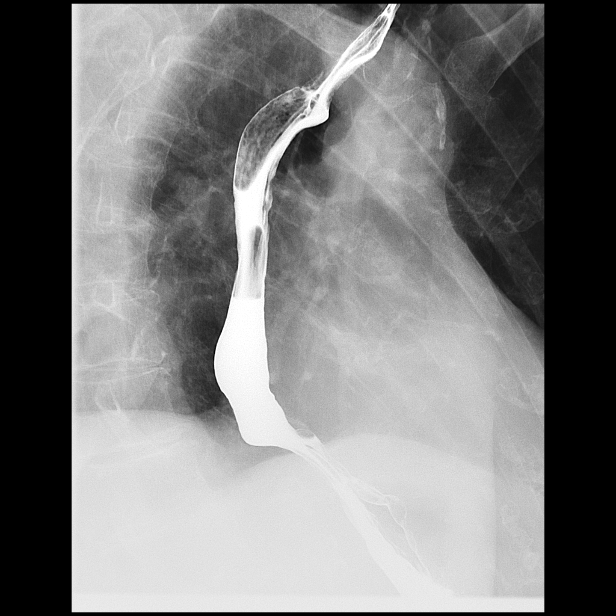

[Series 7: fluoro_barium 2fps_bw · 0.18mm/px · 1 of 2 frames shown (7 of 8)]
[frame 1/2]
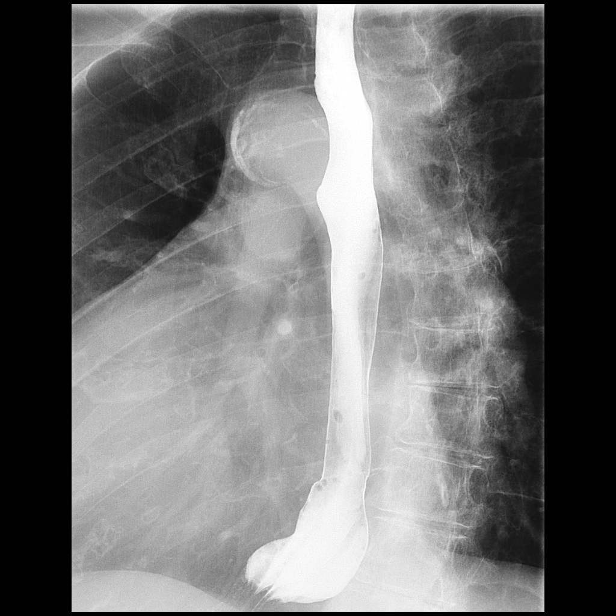

[Series 8: fluoro_barium 2fps_bw · 0.18mm/px · 2 of 2 frames shown (8 of 8)]
[frame 1/2]
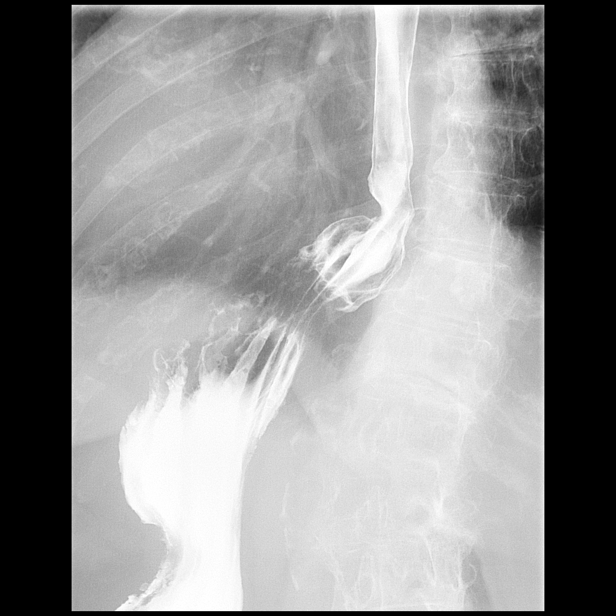
[frame 2/2]
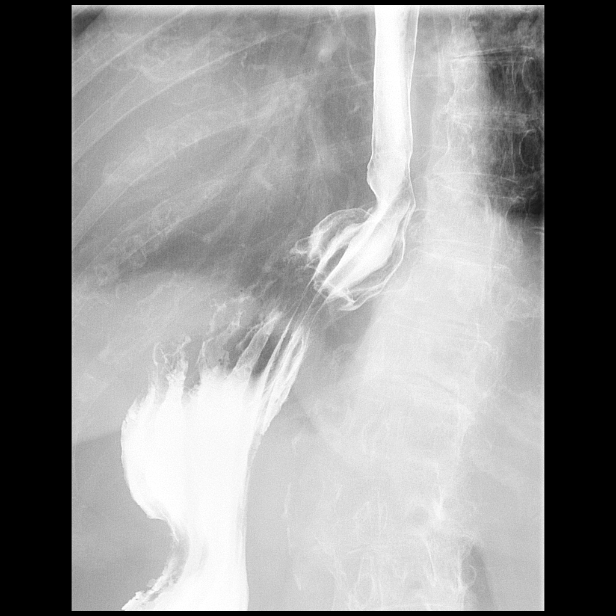

[Series 10: cp_standard · 0.18mm/px · 1 of 1 slices shown]
[im 1/1]
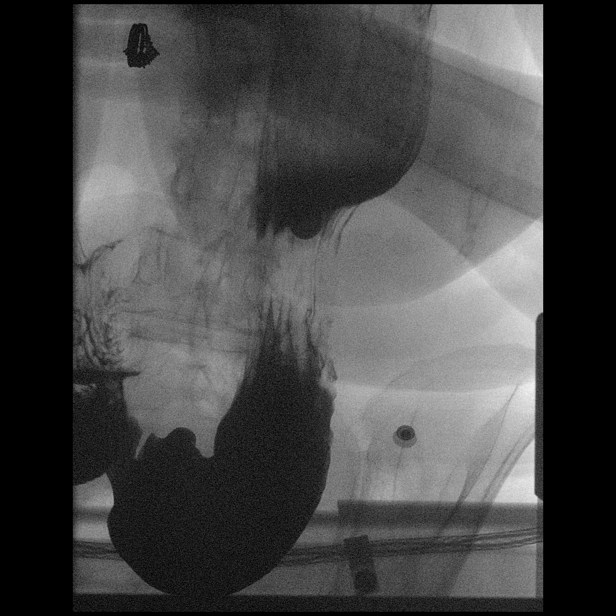

[14 of 22 positions shown; findings below may reference images not displayed]

FINDINGS: Mild deformity noted the posterior cervical esophagus from cervical
osteophytes. Cervical esophagus is widely patent. No aspiration
demonstrated. Thoracic esophagus is widely patent. Small hiatal
hernia. Peristalsis normal. No reflux. Standard barium tablet passes
normally.
IMPRESSION: 1. Mild deformity noted the posterior cervical esophagus from
cervical osteophytes. Cervical esophagus is widely patent. No
aspiration.

2. Thoracic esophagus is widely patent. Small hiatal hernia. No
reflux demonstrated. Standard barium tablet passes normally into the
stomach.

## 2023-04-30 IMAGING — DX DG HIP (WITH OR WITHOUT PELVIS) 2-3V*R*
2 series · 2 of 2 positions shown · non-contrast
Comparison: Preop radiographs dated 12/11/2020

CLINICAL DATA: Postop right hip surgery

EXAM:
DG HIP (WITH OR WITHOUT PELVIS) 2-3V RIGHT

[pelvis ap]
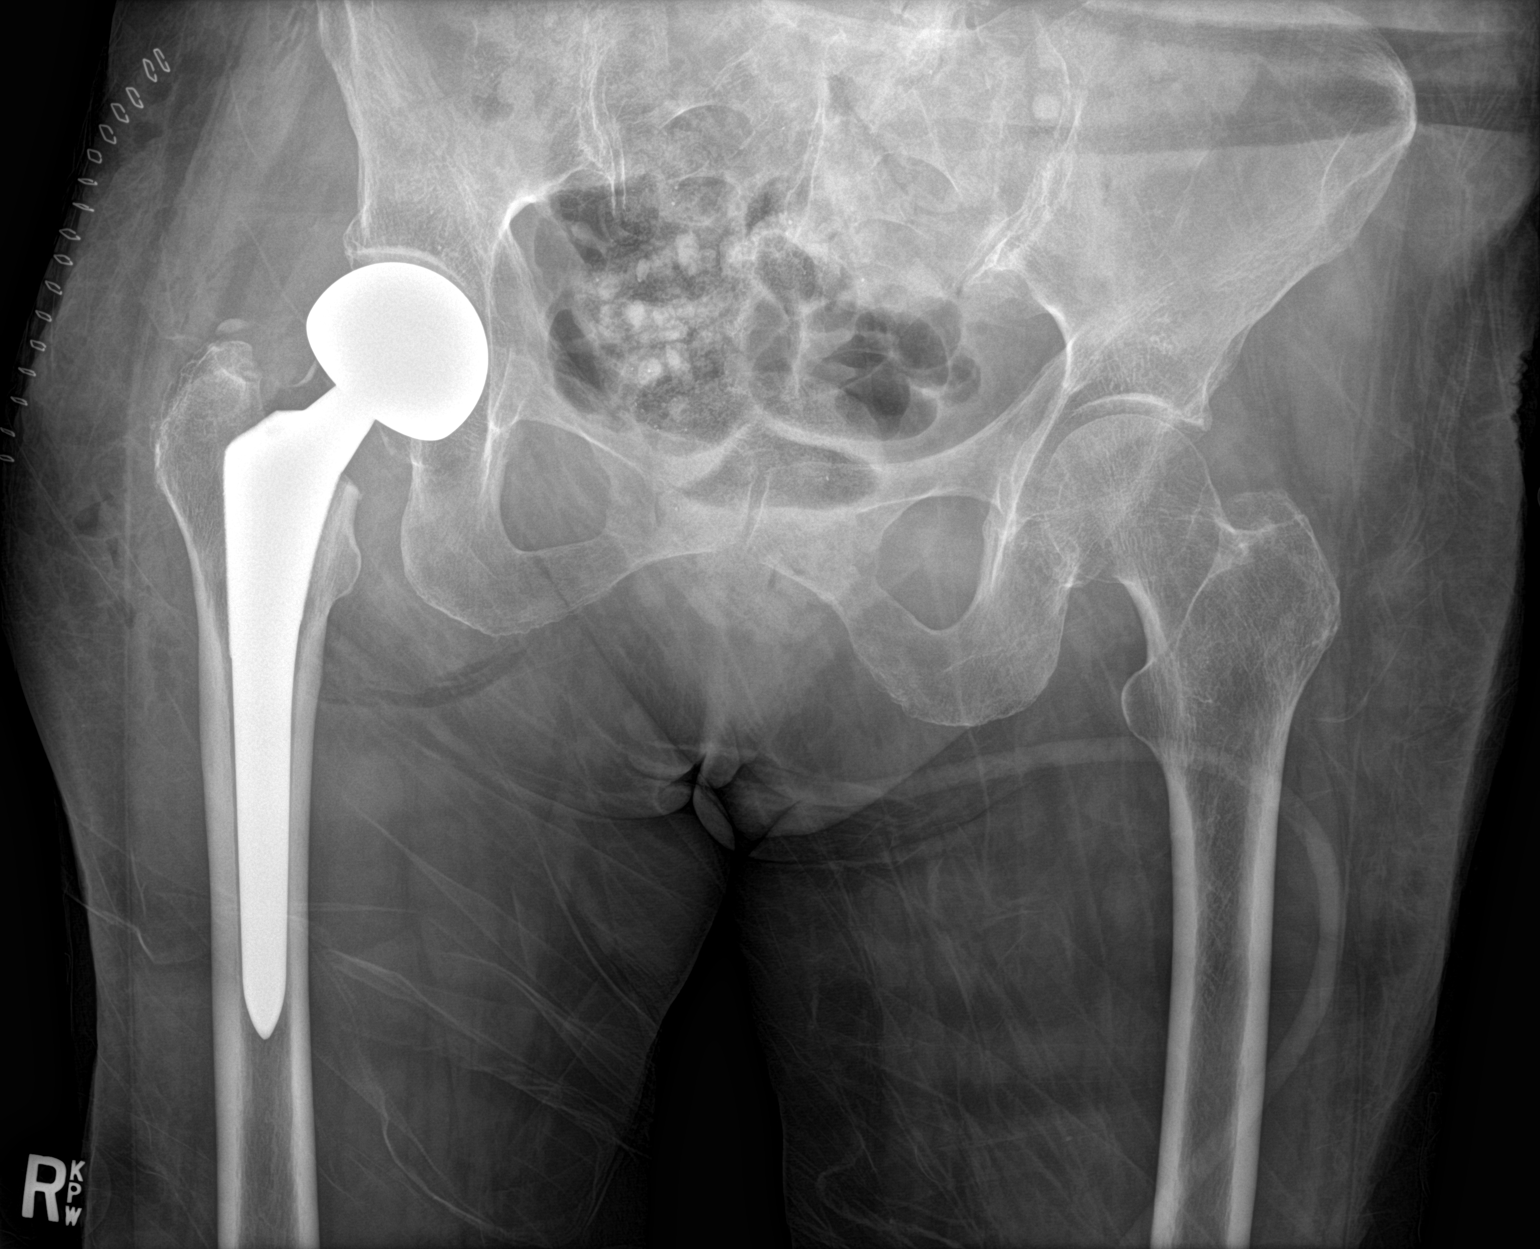

[hip lat]
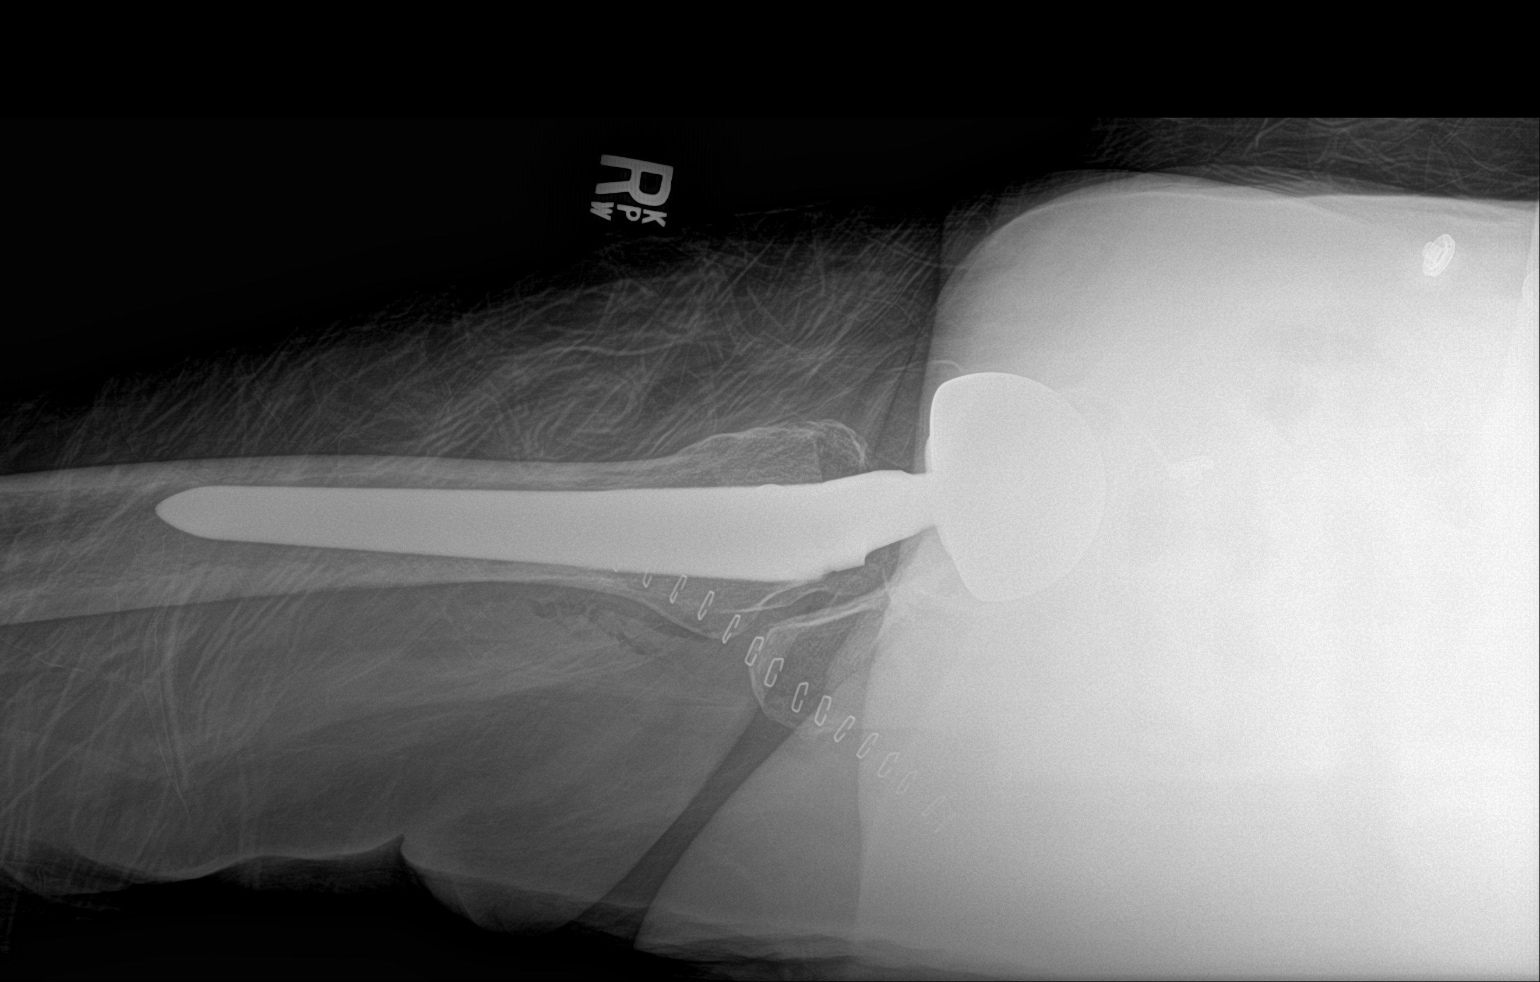

[2 of 2 positions shown; findings below may reference images not displayed]

FINDINGS: Right hip hemiarthroplasty in satisfactory position. Overlying skin
staples.

Visualized bony pelvis appears intact
IMPRESSION: Right hip hemiarthroplasty in satisfactory position.
